# Patient Record
Sex: Male | Born: 1970 | Race: Black or African American | Hispanic: No | Marital: Single | Smoking: Never smoker
Health system: Southern US, Community
[De-identification: ages and names within clinical notes are randomized; demographics above are authoritative.]

## PROBLEM LIST (undated history)

## (undated) DIAGNOSIS — I10 Essential (primary) hypertension: Secondary | ICD-10-CM

## (undated) DIAGNOSIS — I509 Heart failure, unspecified: Secondary | ICD-10-CM

## (undated) DIAGNOSIS — E669 Obesity, unspecified: Secondary | ICD-10-CM

## (undated) DIAGNOSIS — E119 Type 2 diabetes mellitus without complications: Secondary | ICD-10-CM

## (undated) HISTORY — DX: Heart failure, unspecified: I50.9

## (undated) HISTORY — DX: Obesity, unspecified: E66.9

---

## 2019-04-18 HISTORY — PX: COLONOSCOPY: SHX174

## 2020-06-04 ENCOUNTER — Emergency Department (HOSPITAL_COMMUNITY): Payer: Self-pay

## 2020-06-04 ENCOUNTER — Emergency Department (HOSPITAL_COMMUNITY)
Admission: EM | Admit: 2020-06-04 | Discharge: 2020-06-04 | Disposition: A | Discharge status: Home / Self Care | Payer: Self-pay | Attending: Emergency Medicine | Admitting: Emergency Medicine

## 2020-06-04 ENCOUNTER — Encounter (HOSPITAL_COMMUNITY): Payer: Self-pay | Admitting: Emergency Medicine

## 2020-06-04 ENCOUNTER — Other Ambulatory Visit: Payer: Self-pay

## 2020-06-04 DIAGNOSIS — E1165 Type 2 diabetes mellitus with hyperglycemia: Secondary | ICD-10-CM | POA: Insufficient documentation

## 2020-06-04 DIAGNOSIS — R0602 Shortness of breath: Secondary | ICD-10-CM

## 2020-06-04 DIAGNOSIS — Z79899 Other long term (current) drug therapy: Secondary | ICD-10-CM | POA: Insufficient documentation

## 2020-06-04 DIAGNOSIS — M7989 Other specified soft tissue disorders: Secondary | ICD-10-CM

## 2020-06-04 DIAGNOSIS — I509 Heart failure, unspecified: Secondary | ICD-10-CM | POA: Insufficient documentation

## 2020-06-04 DIAGNOSIS — R21 Rash and other nonspecific skin eruption: Secondary | ICD-10-CM | POA: Insufficient documentation

## 2020-06-04 DIAGNOSIS — Z20822 Contact with and (suspected) exposure to covid-19: Secondary | ICD-10-CM | POA: Insufficient documentation

## 2020-06-04 DIAGNOSIS — R059 Cough, unspecified: Secondary | ICD-10-CM | POA: Insufficient documentation

## 2020-06-04 DIAGNOSIS — R600 Localized edema: Secondary | ICD-10-CM | POA: Insufficient documentation

## 2020-06-04 DIAGNOSIS — R000 Tachycardia, unspecified: Secondary | ICD-10-CM | POA: Insufficient documentation

## 2020-06-04 DIAGNOSIS — R079 Chest pain, unspecified: Secondary | ICD-10-CM | POA: Insufficient documentation

## 2020-06-04 DIAGNOSIS — Z7982 Long term (current) use of aspirin: Secondary | ICD-10-CM | POA: Insufficient documentation

## 2020-06-04 DIAGNOSIS — I110 Hypertensive heart disease with heart failure: Secondary | ICD-10-CM | POA: Insufficient documentation

## 2020-06-04 DIAGNOSIS — R739 Hyperglycemia, unspecified: Secondary | ICD-10-CM

## 2020-06-04 HISTORY — DX: Type 2 diabetes mellitus without complications: E11.9

## 2020-06-04 HISTORY — DX: Essential (primary) hypertension: I10

## 2020-06-04 HISTORY — DX: Heart failure, unspecified: I50.9

## 2020-06-04 LAB — BASIC METABOLIC PANEL
Anion gap: 15 (ref 5–15)
BUN: 9 mg/dL (ref 6–20)
CO2: 21 mmol/L — ABNORMAL LOW (ref 22–32)
Calcium: 9 mg/dL (ref 8.9–10.3)
Chloride: 96 mmol/L — ABNORMAL LOW (ref 98–111)
Creatinine, Ser: 1.13 mg/dL (ref 0.61–1.24)
GFR, Estimated: >60 mL/min (ref >60)
Glucose, Bld: 537 mg/dL (ref 70–99)
Potassium: 3.5 mmol/L (ref 3.5–5.1)
Sodium: 132 mmol/L — ABNORMAL LOW (ref 135–145)

## 2020-06-04 LAB — TROPONIN I (HIGH SENSITIVITY)
Troponin I (High Sensitivity): 30 ng/L — ABNORMAL HIGH (ref <18)
Troponin I (High Sensitivity): 30 ng/L — ABNORMAL HIGH (ref <18)

## 2020-06-04 LAB — RESP PANEL BY RT-PCR (FLU A&B, COVID) ARPGX2
Influenza A by PCR: NEGATIVE
Influenza B by PCR: NEGATIVE
SARS Coronavirus 2 by RT PCR: NEGATIVE

## 2020-06-04 LAB — HEPATIC FUNCTION PANEL
ALT: 29 U/L (ref 0–44)
AST: 35 U/L (ref 15–41)
Albumin: 3.5 g/dL (ref 3.5–5.0)
Alkaline Phosphatase: 86 U/L (ref 38–126)
Bilirubin, Direct: 0.3 mg/dL — ABNORMAL HIGH (ref 0.0–0.2)
Indirect Bilirubin: 0.8 mg/dL (ref 0.3–0.9)
Total Bilirubin: 1.1 mg/dL (ref 0.3–1.2)
Total Protein: 7.8 g/dL (ref 6.5–8.1)

## 2020-06-04 LAB — CBC
HCT: 47.6 % (ref 39.0–52.0)
Hemoglobin: 16.2 g/dL (ref 13.0–17.0)
MCH: 31 pg (ref 26.0–34.0)
MCHC: 34 g/dL (ref 30.0–36.0)
MCV: 91 fL (ref 80.0–100.0)
Platelets: 271 10*3/uL (ref 150–400)
RBC: 5.23 MIL/uL (ref 4.22–5.81)
RDW: 13.3 % (ref 11.5–15.5)
WBC: 8.7 10*3/uL (ref 4.0–10.5)
nRBC: 0 % (ref 0.0–0.2)

## 2020-06-04 LAB — CBG MONITORING, ED: Glucose-Capillary: 369 mg/dL — ABNORMAL HIGH (ref 70–99)

## 2020-06-04 LAB — BRAIN NATRIURETIC PEPTIDE: B Natriuretic Peptide: 499.7 pg/mL — ABNORMAL HIGH (ref 0.0–100.0)

## 2020-06-04 MED ORDER — METOPROLOL TARTRATE 5 MG/5ML IV SOLN
5.0000 mg | Freq: Once | INTRAVENOUS | Status: AC
  Administered 2020-06-04: 5 mg via INTRAVENOUS
  Filled 2020-06-04: qty 5

## 2020-06-04 MED ORDER — FUROSEMIDE 40 MG PO TABS: 40.0000 mg | ORAL_TABLET | Freq: Every day | ORAL | 0 refills | Status: DC

## 2020-06-04 MED ORDER — METFORMIN HCL 1000 MG PO TABS: 1000.0000 mg | ORAL_TABLET | Freq: Two times a day (BID) | ORAL | 0 refills | Status: DC

## 2020-06-04 MED ORDER — METOPROLOL TARTRATE 25 MG PO TABS: 12.5000 mg | ORAL_TABLET | Freq: Two times a day (BID) | ORAL | 0 refills | Status: DC

## 2020-06-04 MED ORDER — BASAGLAR KWIKPEN 100 UNIT/ML ~~LOC~~ SOPN: 30.0000 [IU] | PEN_INJECTOR | Freq: Every day | SUBCUTANEOUS | 0 refills | Status: DC

## 2020-06-04 MED ORDER — ATORVASTATIN CALCIUM 40 MG PO TABS: 40.0000 mg | ORAL_TABLET | Freq: Every day | ORAL | 0 refills | Status: DC

## 2020-06-04 MED ORDER — FUROSEMIDE 10 MG/ML IJ SOLN
40.0000 mg | Freq: Once | INTRAMUSCULAR | Status: AC
  Administered 2020-06-04: 40 mg via INTRAVENOUS
  Filled 2020-06-04: qty 4

## 2020-06-04 MED ORDER — INSULIN ASPART 100 UNIT/ML ~~LOC~~ SOLN
5.0000 [IU] | Freq: Once | SUBCUTANEOUS | Status: AC
  Administered 2020-06-04: 5 [IU] via SUBCUTANEOUS

## 2020-06-04 NOTE — ED Notes (Signed)
Patient Alert and oriented to baseline. Stable and ambulatory to baseline. Patient verbalized understanding of the discharge instructions.  Patient belongings were taken by the patient.   

## 2020-06-04 NOTE — ED Notes (Signed)
Pt ambulated without difficulty, gait steady and even. Oxygen saturation remained 98% and above.

## 2020-06-04 NOTE — ED Provider Notes (Signed)
[name] Provider Note   CSN: [id number] Arrival date & time: 06/04/20  1256     History Chief Complaint  Patient presents with  . Congestive Heart Failure    Cleotilde NeerJames Young is a 50 y.o. male w/ h/o CHF (LVEF 15-20%), non-ischemic cardiomyopathy, T2DM, and HLD who presents to the ED for leg swelling and SOB. Patient recently moved to the area from [place] and ran out of all of his medications approximately 1 month ago and has not yet established himself with a new provider. Endorses leg swelling, abdominal distention, DOE, orthopnea, and cough when lying flat. Patient also has not taken his metformin for the last month. Denies fever, chills, abdominal, N/V/D. Brief moment of sharp chest pain with walking several days ago that lasted several minutes and resolved spontaneously. No episodes of chest pain since then.  The history is provided by the patient and medical records.  Congestive Heart Failure This is a new problem. The current episode started more than 1 week ago. The problem occurs constantly. The problem has been gradually worsening. Associated symptoms include chest pain and shortness of breath. Pertinent negatives include no abdominal pain and no headaches. The symptoms are aggravated by walking (lying flat). The symptoms are relieved by rest (sitting up). He has tried nothing for the symptoms.  Shortness of Breath Severity:  Moderate Onset quality:  Gradual Duration:  1 month Timing:  Intermittent Progression:  Worsening Chronicity:  New Context: activity   Context comment:  Out of CHF medications Relieved by:  Rest and sitting up Worsened by:  Exertion (lying flat) Ineffective treatments:  None tried Associated symptoms: chest pain, cough and rash   Associated symptoms: no abdominal pain, no diaphoresis, no ear pain, no fever, no headaches, no sore throat, no sputum production, no vomiting and no wheezing   Risk factors: obesity         Past Medical History:  Diagnosis Date  . CHF (congestive heart failure) (HCC)   . Diabetes mellitus without complication (HCC)   . Hypertension     There are no problems to display for this patient.   History reviewed. No pertinent surgical history.     No family history on file.     Home Medications Prior to Admission medications   Medication Sig Start Date End Date Taking? Authorizing Provider  aspirin EC 81 MG tablet Take 81 mg by mouth daily. Swallow whole.   Yes [provider]  ibuprofen (ADVIL) 200 MG tablet Take 400 mg by mouth every 6 (six) hours as needed for headache.   Yes [provider]  Multiple Vitamins-Minerals (MULTIVITAMIN WITH MINERALS) tablet Take 1 tablet by mouth 3 (three) times a week.   Yes [provider]  atorvastatin (LIPITOR) 40 MG tablet Take 1 tablet (40 mg total) by mouth daily. 06/04/20   [name], Quantavius Humm, MD  furosemide (LASIX) 40 MG tablet Take 1 tablet (40 mg total) by mouth daily. 06/04/20   [name], [name], MD  Insulin Glargine [facility](BASAGLAR KWIKPEN) 100 UNIT/ML Inject 30 Units into the skin at bedtime for 14 days. 06/04/20 06/18/20  [name], [name], MD  metFORMIN (GLUCOPHAGE) 1000 MG tablet Take 1 tablet (1,000 mg total) by mouth 2 (two) times daily with a meal for 14 days. 06/04/20 06/18/20  [name], [name], MD  metoprolol tartrate (LOPRESSOR) 25 MG tablet Take 0.5 tablets (12.5 mg total) by mouth 2 (two) times daily for 14 days. 06/04/20 06/18/20  [name], [name], MD    Allergies  Lisinopril  Review of Systems   Review of Systems  Constitutional: Negative for chills, diaphoresis and fever.  HENT: Negative for ear pain and sore throat.   Eyes: Negative for pain and visual disturbance.  Respiratory: Positive for cough and shortness of breath. Negative for sputum production and wheezing.   Cardiovascular: Positive for chest pain and leg swelling. Negative for palpitations.  Gastrointestinal: Positive for abdominal distention.  Negative for abdominal pain and vomiting.  Genitourinary: Negative for dysuria and hematuria.  Musculoskeletal: Negative for arthralgias and back pain.  Skin: Positive for rash. Negative for color change.  Neurological: Negative for seizures, syncope and headaches.  All other systems reviewed and are negative.   Physical Exam Updated Vital Signs BP (!) 157/116   Pulse 95   Temp 98.7 F (37.1 C)   Resp 20   SpO2 100%   Physical Exam Vitals and nursing note reviewed.  Constitutional:      General: He is awake. He is not in acute distress.    Appearance: He is well-developed and well-nourished. He is morbidly obese. He is not ill-appearing.  HENT:     Head: Normocephalic and atraumatic.     Right Ear: External ear normal.     Left Ear: External ear normal.     Nose: Nose normal.     Mouth/Throat:     Mouth: Mucous membranes are moist.     Pharynx: Oropharynx is clear. No oropharyngeal exudate or posterior oropharyngeal erythema.  Eyes:     General: No scleral icterus.       Right eye: No discharge.        Left eye: No discharge.     Conjunctiva/sclera: Conjunctivae normal.  Neck:     Vascular: No JVD.  Cardiovascular:     Rate and Rhythm: Regular rhythm. Tachycardia present.     Pulses: Normal pulses.          Radial pulses are 2+ on the right side and 2+ on the left side.     Heart sounds: Normal heart sounds. No murmur heard.   Pulmonary:     Effort: Pulmonary effort is normal. No respiratory distress.     Breath sounds: Normal breath sounds. No wheezing, rhonchi or rales.  Abdominal:     General: Abdomen is flat. There is no distension.     Palpations: Abdomen is soft.     Tenderness: There is no abdominal tenderness. There is no guarding or rebound.  Musculoskeletal:     Cervical back: Neck supple.     Right lower leg: 1+ Pitting Edema present.     Left lower leg: 1+ Pitting Edema present.  Skin:    General: Skin is warm and dry.  Neurological:     General:  No focal deficit present.     Mental Status: He is alert and oriented to person, place, and time.     Sensory: No sensory deficit.     Motor: No weakness.  Psychiatric:        Mood and Affect: Mood and affect and mood normal.        Behavior: Behavior normal. Behavior is cooperative.     ED Results / Procedures / Treatments   Labs (all labs ordered are listed, but only abnormal results are displayed) Labs Reviewed  BASIC METABOLIC PANEL - Abnormal; Notable for the following components:      Result Value   Sodium 132 (*)    Chloride 96 (*)    CO2 21 (*)  Glucose, Bld 537 (*)    All other components within normal limits  HEPATIC FUNCTION PANEL - Abnormal; Notable for the following components:   Bilirubin, Direct 0.3 (*)    All other components within normal limits  BRAIN NATRIURETIC PEPTIDE - Abnormal; Notable for the following components:   B Natriuretic Peptide 499.7 (*)    All other components within normal limits  CBG MONITORING, ED - Abnormal; Notable for the following components:   Glucose-Capillary 369 (*)    All other components within normal limits  TROPONIN I (HIGH SENSITIVITY) - Abnormal; Notable for the following components:   Troponin I (High Sensitivity) 30 (*)    All other components within normal limits  TROPONIN I (HIGH SENSITIVITY) - Abnormal; Notable for the following components:   Troponin I (High Sensitivity) 30 (*)    All other components within normal limits  RESP PANEL BY RT-PCR (FLU A&B, COVID) [id number]  CBC    EKG EKG Interpretation  Date/Time:  Friday June 04 2020 13:01:09 EST Ventricular Rate:  117 PR Interval:  146 QRS Duration: 134 QT Interval:  344 QTC Calculation: 479 R Axis:   -70 Text Interpretation: Sinus tachycardia Left axis deviation Non-specific intra-ventricular conduction block Minimal voltage criteria for LVH, may be normal variant ( Cornell product ) Cannot rule out Septal infarct , age undetermined Possible Lateral  infarct , age undetermined Abnormal ECG No old tracing to compare Confirmed by Pricilla Loveless [phone]) on 06/04/2020 3:14:23 PM   Radiology DG Chest 2 View  Result Date: 06/04/2020 CLINICAL DATA:  Chest pain EXAM: CHEST - 2 VIEW COMPARISON:  August 08, 2016 FINDINGS: The cardiomediastinal silhouette is enlarged and unchanged in contour. No pleural effusion. No pneumothorax. Mild perihilar vascular fullness, peribronchial cuffing and mild interstitial prominence. Visualized abdomen is unremarkable. No acute osseous abnormality noted. IMPRESSION: Constellation of findings are favored to reflect mild pulmonary edema. Electronically Signed   By: Meda Klinefelter MD   On: 06/04/2020 13:22    Procedures Procedures  Medications Ordered in ED Medications  furosemide (LASIX) injection 40 mg (40 mg Intravenous Given 06/04/20 1545)  insulin aspart (novoLOG) injection 5 Units (5 Units Subcutaneous Given 06/04/20 1545)  metoprolol tartrate (LOPRESSOR) injection 5 mg (5 mg Intravenous Given 06/04/20 1546)    ED Course  I have reviewed the triage vital signs and the nursing notes.  Pertinent labs & imaging results that were available during my care of the patient were reviewed by me and considered in my medical decision making (see chart for details).    MDM Rules/Calculators/A&P                          Patient is a 49yoM with history and physical as described above who presents to the ED for leg swelling, orthopnea, and DOE. VS notable for tachycardia to 100s, hypertensive with SBP 160s, satting well on RA, and otherwise HDS. Patient resting comfortably in no acute distress. Initial workup includes CBC, BMP, LFTs, BNP, trop, ECG, and CXR. Initial treatment includes Lasix, metoprolol, and insulin.  On reassessment, patient ambulating in ED and maintaining O2 sats 98% without significant tachycardia or tachypnea. Patient states he feels well and comfortable returning home. Repeat glucose improved  following insulin. Labs not concerning for DKA or HHS. BNP elevated concerning for mild CHF exacerbation in setting of medication non-adherence. Troponin mildly elevated but delta troponin stable. ECG also reassuring for ACS and no reported chest pain. Doubt infectious etiology at  this time given patient afebrile and no leukocytosis. Given reassuring presentation and ambulating without hypoxia or increased WOB, patient appropriate for discharge. Shared decision making with patient. Will refill patient's medications. Patient states his mother works at [employer] and will help him find new PCP. ED pharmacist performed med rec and prescriptions re-ordered. Patient otherwise HDS and appropriate for discharge.  Strict return precautions provided and discussed. Questions and concerns addressed. Patient verbalized understanding and amenable with discharge plan. Patient discharged in stable condition.  Final Clinical Impression(s) / ED Diagnoses Final diagnoses:  Leg swelling  SOB (shortness of breath)  Congestive heart failure, unspecified HF chronicity, unspecified heart failure type (HCC)  Hyperglycemia    Rx / DC Orders ED Discharge Orders         Ordered    atorvastatin (LIPITOR) 40 MG tablet  Daily        06/04/20 1804    furosemide (LASIX) 40 MG tablet  Daily        06/04/20 1804    Insulin Glargine (BASAGLAR KWIKPEN) 100 UNIT/ML  Daily at bedtime        06/04/20 1804    metFORMIN (GLUCOPHAGE) 1000 MG tablet  2 times daily with meals        06/04/20 1804    metoprolol tartrate (LOPRESSOR) 25 MG tablet  2 times daily        06/04/20 1804           Tonia Brooms, MD 06/05/20 Estrella Myrtle    Pricilla Loveless, MD 06/05/20 2121

## 2020-06-04 NOTE — ED Triage Notes (Addendum)
Pt reports hx of CHF and been out of medications for over 1  Month, he is here today due to bilateral leg swelling and shortness of breath. He has also had intermittent chest pains over the last week none currently.   He also adds he has HTN & T2DM and has not had these meds for 1 month.

## 2020-06-04 NOTE — ED Notes (Signed)
D&C IV 

## 2021-01-26 ENCOUNTER — Encounter (HOSPITAL_COMMUNITY): Payer: Self-pay | Admitting: Emergency Medicine

## 2021-01-26 ENCOUNTER — Inpatient Hospital Stay (HOSPITAL_COMMUNITY)
Admission: EM | Admit: 2021-01-26 | Discharge: 2021-01-31 | DRG: 291 | Disposition: A | Discharge status: Home / Self Care | Payer: Self-pay | Attending: Internal Medicine | Admitting: Internal Medicine

## 2021-01-26 ENCOUNTER — Emergency Department (HOSPITAL_COMMUNITY): Payer: Self-pay

## 2021-01-26 ENCOUNTER — Other Ambulatory Visit: Payer: Self-pay

## 2021-01-26 DIAGNOSIS — T502X5A Adverse effect of carbonic-anhydrase inhibitors, benzothiadiazides and other diuretics, initial encounter: Secondary | ICD-10-CM | POA: Diagnosis present

## 2021-01-26 DIAGNOSIS — E785 Hyperlipidemia, unspecified: Secondary | ICD-10-CM | POA: Diagnosis present

## 2021-01-26 DIAGNOSIS — Z794 Long term (current) use of insulin: Secondary | ICD-10-CM

## 2021-01-26 DIAGNOSIS — Z7982 Long term (current) use of aspirin: Secondary | ICD-10-CM

## 2021-01-26 DIAGNOSIS — I160 Hypertensive urgency: Secondary | ICD-10-CM | POA: Diagnosis present

## 2021-01-26 DIAGNOSIS — Z597 Insufficient social insurance and welfare support: Secondary | ICD-10-CM

## 2021-01-26 DIAGNOSIS — E1165 Type 2 diabetes mellitus with hyperglycemia: Secondary | ICD-10-CM | POA: Diagnosis present

## 2021-01-26 DIAGNOSIS — J810 Acute pulmonary edema: Secondary | ICD-10-CM

## 2021-01-26 DIAGNOSIS — I10 Essential (primary) hypertension: Secondary | ICD-10-CM | POA: Diagnosis present

## 2021-01-26 DIAGNOSIS — E1100 Type 2 diabetes mellitus with hyperosmolarity without nonketotic hyperglycemic-hyperosmolar coma (NKHHC): Secondary | ICD-10-CM

## 2021-01-26 DIAGNOSIS — Z79899 Other long term (current) drug therapy: Secondary | ICD-10-CM

## 2021-01-26 DIAGNOSIS — Z6841 Body Mass Index (BMI) 40.0 and over, adult: Secondary | ICD-10-CM

## 2021-01-26 DIAGNOSIS — Z888 Allergy status to other drugs, medicaments and biological substances status: Secondary | ICD-10-CM

## 2021-01-26 DIAGNOSIS — E119 Type 2 diabetes mellitus without complications: Secondary | ICD-10-CM

## 2021-01-26 DIAGNOSIS — E1140 Type 2 diabetes mellitus with diabetic neuropathy, unspecified: Secondary | ICD-10-CM | POA: Diagnosis present

## 2021-01-26 DIAGNOSIS — I248 Other forms of acute ischemic heart disease: Secondary | ICD-10-CM | POA: Diagnosis present

## 2021-01-26 DIAGNOSIS — R0602 Shortness of breath: Secondary | ICD-10-CM

## 2021-01-26 DIAGNOSIS — I420 Dilated cardiomyopathy: Secondary | ICD-10-CM | POA: Diagnosis present

## 2021-01-26 DIAGNOSIS — I110 Hypertensive heart disease with heart failure: Principal | ICD-10-CM | POA: Diagnosis present

## 2021-01-26 DIAGNOSIS — E66812 Obesity, class 2: Secondary | ICD-10-CM

## 2021-01-26 DIAGNOSIS — I454 Nonspecific intraventricular block: Secondary | ICD-10-CM | POA: Diagnosis present

## 2021-01-26 DIAGNOSIS — Z91119 Patient's noncompliance with dietary regimen due to unspecified reason: Secondary | ICD-10-CM

## 2021-01-26 DIAGNOSIS — I2489 Other forms of acute ischemic heart disease: Secondary | ICD-10-CM

## 2021-01-26 DIAGNOSIS — I5023 Acute on chronic systolic (congestive) heart failure: Secondary | ICD-10-CM

## 2021-01-26 DIAGNOSIS — E876 Hypokalemia: Secondary | ICD-10-CM

## 2021-01-26 DIAGNOSIS — Z9114 Patient's other noncompliance with medication regimen: Secondary | ICD-10-CM

## 2021-01-26 DIAGNOSIS — E66813 Obesity, class 3: Secondary | ICD-10-CM

## 2021-01-26 DIAGNOSIS — Z20822 Contact with and (suspected) exposure to covid-19: Secondary | ICD-10-CM | POA: Diagnosis present

## 2021-01-26 DIAGNOSIS — I5043 Acute on chronic combined systolic (congestive) and diastolic (congestive) heart failure: Secondary | ICD-10-CM | POA: Diagnosis present

## 2021-01-26 DIAGNOSIS — Z7984 Long term (current) use of oral hypoglycemic drugs: Secondary | ICD-10-CM

## 2021-01-26 LAB — CBC WITH DIFFERENTIAL/PLATELET
Abs Immature Granulocytes: 0.02 10*3/uL (ref 0.00–0.07)
Basophils Absolute: 0.1 10*3/uL (ref 0.0–0.1)
Basophils Relative: 1 %
Eosinophils Absolute: 0.1 10*3/uL (ref 0.0–0.5)
Eosinophils Relative: 2 %
HCT: 47.5 % (ref 39.0–52.0)
Hemoglobin: 15.8 g/dL (ref 13.0–17.0)
Immature Granulocytes: 0 %
Lymphocytes Relative: 21 %
Lymphs Abs: 1.5 10*3/uL (ref 0.7–4.0)
MCH: 30.2 pg (ref 26.0–34.0)
MCHC: 33.3 g/dL (ref 30.0–36.0)
MCV: 90.6 fL (ref 80.0–100.0)
Monocytes Absolute: 0.6 10*3/uL (ref 0.1–1.0)
Monocytes Relative: 9 %
Neutro Abs: 4.9 10*3/uL (ref 1.7–7.7)
Neutrophils Relative %: 67 %
Platelets: 218 10*3/uL (ref 150–400)
RBC: 5.24 MIL/uL (ref 4.22–5.81)
RDW: 13.1 % (ref 11.5–15.5)
WBC: 7.3 10*3/uL (ref 4.0–10.5)
nRBC: 0 % (ref 0.0–0.2)

## 2021-01-26 LAB — GLUCOSE, CAPILLARY: Glucose-Capillary: 281 mg/dL — ABNORMAL HIGH (ref 70–99)

## 2021-01-26 LAB — COMPREHENSIVE METABOLIC PANEL
ALT: 38 U/L (ref 0–44)
AST: 32 U/L (ref 15–41)
Albumin: 3.1 g/dL — ABNORMAL LOW (ref 3.5–5.0)
Alkaline Phosphatase: 89 U/L (ref 38–126)
Anion gap: 12 (ref 5–15)
BUN: 11 mg/dL (ref 6–20)
CO2: 26 mmol/L (ref 22–32)
Calcium: 9 mg/dL (ref 8.9–10.3)
Chloride: 97 mmol/L — ABNORMAL LOW (ref 98–111)
Creatinine, Ser: 1.22 mg/dL (ref 0.61–1.24)
GFR, Estimated: >60 mL/min (ref >60)
Glucose, Bld: 315 mg/dL — ABNORMAL HIGH (ref 70–99)
Potassium: 3.2 mmol/L — ABNORMAL LOW (ref 3.5–5.1)
Sodium: 135 mmol/L (ref 135–145)
Total Bilirubin: 1.7 mg/dL — ABNORMAL HIGH (ref 0.3–1.2)
Total Protein: 6.8 g/dL (ref 6.5–8.1)

## 2021-01-26 LAB — BRAIN NATRIURETIC PEPTIDE: B Natriuretic Peptide: 740.6 pg/mL — ABNORMAL HIGH (ref 0.0–100.0)

## 2021-01-26 LAB — RESP PANEL BY RT-PCR (FLU A&B, COVID) ARPGX2
Influenza A by PCR: NEGATIVE
Influenza B by PCR: NEGATIVE
SARS Coronavirus 2 by RT PCR: NEGATIVE

## 2021-01-26 LAB — TROPONIN I (HIGH SENSITIVITY)
Troponin I (High Sensitivity): 39 ng/L — ABNORMAL HIGH (ref <18)
Troponin I (High Sensitivity): 43 ng/L — ABNORMAL HIGH (ref <18)

## 2021-01-26 MED ORDER — ATORVASTATIN CALCIUM 40 MG PO TABS
40.0000 mg | ORAL_TABLET | Freq: Every day | ORAL | Status: DC
  Administered 2021-01-27 – 2021-01-31 (×5): 40 mg via ORAL
  Filled 2021-01-26 (×5): qty 1

## 2021-01-26 MED ORDER — POTASSIUM CHLORIDE CRYS ER 20 MEQ PO TBCR
40.0000 meq | EXTENDED_RELEASE_TABLET | Freq: Once | ORAL | Status: AC
  Administered 2021-01-26: 40 meq via ORAL
  Filled 2021-01-26: qty 2

## 2021-01-26 MED ORDER — ASPIRIN EC 81 MG PO TBEC
81.0000 mg | DELAYED_RELEASE_TABLET | Freq: Every day | ORAL | Status: DC
  Administered 2021-01-27 – 2021-01-31 (×5): 81 mg via ORAL
  Filled 2021-01-26 (×5): qty 1

## 2021-01-26 MED ORDER — ENOXAPARIN SODIUM 80 MG/0.8ML IJ SOSY
75.0000 mg | PREFILLED_SYRINGE | Freq: Every day | INTRAMUSCULAR | Status: DC
  Administered 2021-01-26 – 2021-01-30 (×5): 75 mg via SUBCUTANEOUS
  Filled 2021-01-26 (×3): qty 0.8
  Filled 2021-01-26: qty 0.75
  Filled 2021-01-26: qty 0.8

## 2021-01-26 MED ORDER — LABETALOL HCL 5 MG/ML IV SOLN: 10.0000 mg | INTRAVENOUS | Status: DC | PRN

## 2021-01-26 MED ORDER — ACETAMINOPHEN 650 MG RE SUPP: 650.0000 mg | Freq: Four times a day (QID) | RECTAL | Status: DC | PRN

## 2021-01-26 MED ORDER — INSULIN ASPART 100 UNIT/ML IJ SOLN
0.0000 [IU] | Freq: Three times a day (TID) | INTRAMUSCULAR | Status: DC
  Administered 2021-01-27: 2 [IU] via SUBCUTANEOUS
  Administered 2021-01-27: 8 [IU] via SUBCUTANEOUS
  Administered 2021-01-27 – 2021-01-28 (×2): 2 [IU] via SUBCUTANEOUS
  Administered 2021-01-28 – 2021-01-29 (×3): 5 [IU] via SUBCUTANEOUS
  Administered 2021-01-30: 2 [IU] via SUBCUTANEOUS
  Administered 2021-01-30 – 2021-01-31 (×3): 3 [IU] via SUBCUTANEOUS

## 2021-01-26 MED ORDER — FUROSEMIDE 10 MG/ML IJ SOLN
40.0000 mg | Freq: Every day | INTRAMUSCULAR | Status: DC
  Administered 2021-01-27: 40 mg via INTRAVENOUS
  Filled 2021-01-26: qty 4

## 2021-01-26 MED ORDER — ACETAMINOPHEN 325 MG PO TABS: 650.0000 mg | ORAL_TABLET | Freq: Four times a day (QID) | ORAL | Status: DC | PRN

## 2021-01-26 MED ORDER — METOPROLOL TARTRATE 25 MG PO TABS
25.0000 mg | ORAL_TABLET | Freq: Two times a day (BID) | ORAL | Status: DC
  Administered 2021-01-26 – 2021-01-27 (×2): 25 mg via ORAL
  Filled 2021-01-26 (×2): qty 1

## 2021-01-26 MED ORDER — INSULIN GLARGINE-YFGN 100 UNIT/ML ~~LOC~~ SOLN
20.0000 [IU] | Freq: Every day | SUBCUTANEOUS | Status: DC
  Administered 2021-01-26 – 2021-01-30 (×5): 20 [IU] via SUBCUTANEOUS
  Filled 2021-01-26 (×6): qty 0.2

## 2021-01-26 MED ORDER — FUROSEMIDE 10 MG/ML IJ SOLN
40.0000 mg | Freq: Once | INTRAMUSCULAR | Status: AC
  Administered 2021-01-26: 40 mg via INTRAVENOUS
  Filled 2021-01-26: qty 4

## 2021-01-26 MED ORDER — ONDANSETRON HCL 4 MG PO TABS: 4.0000 mg | ORAL_TABLET | Freq: Four times a day (QID) | ORAL | Status: DC | PRN

## 2021-01-26 MED ORDER — NITROGLYCERIN 2 % TD OINT
1.0000 [in_us] | TOPICAL_OINTMENT | Freq: Four times a day (QID) | TRANSDERMAL | Status: DC
  Administered 2021-01-26 – 2021-01-27 (×3): 1 [in_us] via TOPICAL
  Filled 2021-01-26: qty 30
  Filled 2021-01-26: qty 1

## 2021-01-26 MED ORDER — ONDANSETRON HCL 4 MG/2ML IJ SOLN
4.0000 mg | Freq: Four times a day (QID) | INTRAMUSCULAR | Status: DC | PRN
  Administered 2021-01-30: 4 mg via INTRAVENOUS
  Filled 2021-01-26: qty 2

## 2021-01-26 NOTE — ED Triage Notes (Signed)
Pt reports leg and abd swelling for about a week and half. Also having CP and SOB. States he relocated and missed medications.

## 2021-01-26 NOTE — ED Provider Notes (Signed)
Emergency Medicine Provider Triage Evaluation Note  Terry Young , a 50 y.o. male  was evaluated in triage.  Pt complains of chest pain and shortness of breath.  Been intermittent for the last week and a half.  Patient has been without his home medicine for the last month, reports bilateral leg swelling all the way up to his abdomen..  Review of Systems  Positive: Leg and abdominal swelling, chest pain, shortness of breath Negative: Fever  Physical Exam  BP (!) 151/132   Pulse (!) 106   Temp 98.8 F (37.1 C)   Resp 18   Ht 5\' 10"  (1.778 m)   Wt (!) 154.2 kg   SpO2 100%   BMI 48.78 kg/m  Gen:   Awake, no distress   Resp:  Normal effort  MSK:   Moves extremities without difficulty  Other:  Anasarca  Medical Decision Making  Medically screening exam initiated at 4:51 PM.  Appropriate orders placed.  Tyshon Fanning was informed that the remainder of the evaluation will be completed by another provider, this initial triage assessment does not replace that evaluation, and the importance of remaining in the ED until their evaluation is complete.  Labs, imaging for chest pain and fluid retention.  Patient has heart failure, concern for heart failure exacerbation.   Cleotilde Neer, PA-C 01/26/21 1651    03/28/21, MD 01/27/21 [phone]

## 2021-01-26 NOTE — H&P (Signed)
History and Physical    Terry Young DQQ:[id number] DOB: March 21, 1971 DOA: 01/26/2021  PCP: Pcp, No  Patient coming from: Home  I have personally briefly reviewed patient's old medical records in [facility]  Chief Complaint: CP,SOB  HPI: Terry Young is a 50 y.o. male with medical history significant of HFrEF from [facility], DM2, HTN.  Non-adherent to medical therapy.  H/o EF 15-20% previously.  Looks like 25-30% as of 2d echo in 2020.  Nothing more recent that I can see.  Pt ran out of meds a month ago.  Presents to ED with c/o SOB, BLE edema all the way to abdomen, progressively worsening over past month.  Severe SOB with minimal exertion.  No CP.   ED Course: BNP elevation, pulm vasc congestion on CXR.  Given 40mg  IV lasix.  Noted to be very hypertensive with BP 190/160   Review of Systems: As per HPI, otherwise all review of systems negative.  Past Medical History:  Diagnosis Date   CHF (congestive heart failure) (HCC)    Diabetes mellitus without complication (HCC)    Hypertension     History reviewed. No pertinent surgical history.   reports that he has never smoked. He has never used smokeless tobacco. He reports that he does not drink alcohol and does not use drugs.  Allergies  Allergen Reactions   Lisinopril Cough    Family History  Problem Relation Age of Onset   Atrial fibrillation Neg Hx      Prior to Admission medications   Medication Sig Start Date End Date Taking? Authorizing Provider  aspirin EC 81 MG tablet Take 81 mg by mouth daily. Swallow whole.   Yes [provider]  atorvastatin (LIPITOR) 40 MG tablet Take 1 tablet (40 mg total) by mouth daily. 06/04/20  Yes 06/06/20, MD  furosemide (LASIX) 40 MG tablet Take 1 tablet (40 mg total) by mouth daily. 06/04/20  Yes 06/06/20, MD  ibuprofen (ADVIL) 200 MG tablet Take 400 mg by mouth every 6 (six) hours as needed for headache or moderate pain.   Yes [provider]  metFORMIN  (GLUCOPHAGE) 1000 MG tablet Take 1,000 mg by mouth 2 (two) times daily with a meal.   Yes [provider]  metoprolol tartrate (LOPRESSOR) 25 MG tablet Take 25 mg by mouth 2 (two) times daily.   Yes [provider]  Multiple Vitamins-Minerals (CENTRUM MEN) TABS Take 1 tablet by mouth daily.   Yes [provider]  Insulin Glargine (BASAGLAR KWIKPEN) 100 UNIT/ML Inject 30 Units into the skin at bedtime for 14 days. Patient not taking: Reported on 01/26/2021 06/04/20 06/18/20  08/18/20, MD  metoprolol tartrate (LOPRESSOR) 25 MG tablet Take 0.5 tablets (12.5 mg total) by mouth 2 (two) times daily for 14 days. Patient not taking: Reported on 01/26/2021 06/04/20 06/18/20  08/18/20, MD    Physical Exam: Vitals:   01/26/21 2002 01/26/21 2030 01/26/21 2100 01/26/21 2130  BP: (!) 172/122 (!) 175/140 (!) 190/161 (!) 192/130  Pulse: (!) 101 96 95 (!) 102  Resp: 20 14 (!) 22 15  Temp:      SpO2: 100% 100% 100% 100%  Weight:      Height:        Constitutional: NAD, calm, comfortable Eyes: PERRL, lids and conjunctivae normal ENMT: Mucous membranes are moist. Posterior pharynx clear of any exudate or lesions.Normal dentition.  Neck: normal, supple, no masses, no thyromegaly Respiratory: clear to auscultation bilaterally, no wheezing, no crackles.  Normal respiratory effort. No accessory muscle use.  Cardiovascular: Regular rate and rhythm, no murmurs / rubs / gallops. 3+ BLE edema. 2+ pedal pulses. No carotid bruits.  Abdomen: no tenderness, no masses palpated. No hepatosplenomegaly. Bowel sounds positive.  Musculoskeletal: no clubbing / cyanosis. No joint deformity upper and lower extremities. Good ROM, no contractures. Normal muscle tone.  Skin: no rashes, lesions, ulcers. No induration Neurologic: CN 2-12 grossly intact. Sensation intact, DTR normal. Strength 5/5 in all 4.  Psychiatric: Normal judgment and insight. Alert and oriented x 3. Normal mood.    Labs on  Admission: I have personally reviewed following labs and imaging studies  CBC: Recent Labs  Lab 01/26/21 1655  WBC 7.3  NEUTROABS 4.9  HGB 15.8  HCT 47.5  MCV 90.6  PLT 218   Basic Metabolic Panel: Recent Labs  Lab 01/26/21 1655  NA 135  K 3.2*  CL 97*  CO2 26  GLUCOSE 315*  BUN 11  CREATININE 1.22  CALCIUM 9.0   GFR: Estimated Creatinine Clearance: 111.7 mL/min (by C-G formula based on SCr of 1.22 mg/dL). Liver Function Tests: Recent Labs  Lab 01/26/21 1655  AST 32  ALT 38  ALKPHOS 89  BILITOT 1.7*  PROT 6.8  ALBUMIN 3.1*   No results for input(s): LIPASE, AMYLASE in the last 168 hours. No results for input(s): AMMONIA in the last 168 hours. Coagulation Profile: No results for input(s): INR, PROTIME in the last 168 hours. Cardiac Enzymes: No results for input(s): CKTOTAL, CKMB, CKMBINDEX, TROPONINI in the last 168 hours. BNP (last 3 results) No results for input(s): PROBNP in the last 8760 hours. HbA1C: No results for input(s): HGBA1C in the last 72 hours. CBG: No results for input(s): GLUCAP in the last 168 hours. Lipid Profile: No results for input(s): CHOL, HDL, LDLCALC, TRIG, CHOLHDL, LDLDIRECT in the last 72 hours. Thyroid Function Tests: No results for input(s): TSH, T4TOTAL, FREET4, T3FREE, THYROIDAB in the last 72 hours. Anemia Panel: No results for input(s): VITAMINB12, FOLATE, FERRITIN, TIBC, IRON, RETICCTPCT in the last 72 hours. Urine analysis: No results found for: COLORURINE, APPEARANCEUR, LABSPEC, PHURINE, GLUCOSEU, HGBUR, BILIRUBINUR, KETONESUR, PROTEINUR, UROBILINOGEN, NITRITE, LEUKOCYTESUR  Radiological Exams on Admission: DG Chest 2 View  Result Date: 01/26/2021 CLINICAL DATA:  Shortness of breath, leg and abdomen swelling for 1.5 weeks, chest pain, history of fluid on the lungs EXAM: CHEST - 2 VIEW COMPARISON:  06/04/2020 FINDINGS: Enlargement of cardiac silhouette with pulmonary vascular congestion. Mediastinal contours normal.  Minimal RIGHT basilar atelectasis. Lungs otherwise clear. No infiltrate, pleural effusion, or pneumothorax. IMPRESSION: Enlargement of cardiac silhouette with pulmonary vascular congestion. Minimal RIGHT basilar atelectasis. Electronically Signed   By: Ulyses Southward M.D.   On: 01/26/2021 18:27    EKG: Independently reviewed.  Assessment/Plan Principal Problem:   Acute on chronic systolic CHF (congestive heart failure) (HCC) Active Problems:   DM2 (diabetes mellitus, type 2) (HCC)   HTN (hypertension)    Acute on chronic systolic CHF / HTN urgency - Patient has acute decompensated CHF: Patient presents with: dyspnea on exertion / increased shortness of breath Exam findings include: bilateral leg edema Patient is being treated with IV Lasix CHF pathway PRN labetalol Restart metoprolol NTG paste Strict intake and output Daily BMP Tele monitor 2d echo DM2 - Lantus 20u QHS Mod scale SSI AC HTN - Restart metoprolol PRN labetalol NTG paste DVT prophylaxis: Lovenox Code Status: Full Family Communication: No family in room Disposition Plan: Home after CHF symptoms improved Consults called: None  Admission status: Place in obs     Ambreen Tufte M. DO [facility]  How to contact the [facility] Attending or Consulting provider 7A - 7P or covering provider during after hours 7P -7A, for this patient?  Check the care team in [facility] and look for a) attending/consulting [facility] provider listed and b) the [facility] team listed Log into [email or web address]  Amion Physician Scheduling and messaging for groups and whole hospitals  On call and physician scheduling software for group practices, residents, hospitalists and other medical providers for call, clinic, rotation and shift schedules. On[employer] is a hospital-wide system for scheduling doctors and paging doctors on call. EasyPlot is for scientific plotting and data analysis.  [email or web address]  and use [facility] universal password to access.  If you do not have the password, please contact the hospital operator.  Locate the [facility] provider you are looking for under [facility] and page to a number that you can be directly reached. If you still have difficulty reaching the provider, please page the [facility] (Director on Call) for the Hospitalists listed on amion for assistance.  01/26/2021, 10:14 PM

## 2021-01-26 NOTE — ED Notes (Signed)
Sister Victor Langenbach [phone] would like an update

## 2021-01-26 NOTE — ED Provider Notes (Signed)
Emergency Department Provider Note   I have reviewed the triage vital signs and the nursing notes.   HISTORY  Chief Complaint Shortness of Breath   HPI Terry Young is a 50 y.o. male with PMH of CHF (EF 15-20%) followed primarily in the [place] area presents to the ED with sudden edema to the legs and abdomen in the setting of medication noncompliance.  Patient states he is supposed to be taking the Lasix 40 mg twice daily but has essentially been out of all medications for at least the past month.  He tells me that he has rather quickly developed swelling in both legs up to the abdomen.  He is feeling subjectively short of breath with minimal exertion.  He denies chest pain or tightness.  No fevers or chills.  He does not currently follow with a primary care doctor or cardiologist.  He states that he is moving to the [place] area and seeking to establish care. No other modifying factors or radiation of symptoms.    Past Medical History:  Diagnosis Date   CHF (congestive heart failure) (HCC)    Diabetes mellitus without complication (HCC)    Hypertension     Patient Active Problem List   Diagnosis Date Noted   Hypokalemia 01/29/2021   Obesity, Class III, BMI 40-49.9 (morbid obesity) (HCC) 01/29/2021   Systolic CHF, acute on chronic (HCC) 01/27/2021   Acute on chronic combined systolic and diastolic CHF (congestive heart failure) (HCC) 01/26/2021   DM2 (diabetes mellitus, type 2) (HCC) 01/26/2021   HTN (hypertension) 01/26/2021    History reviewed. No pertinent surgical history.  Allergies Lisinopril  Family History  Problem Relation Age of Onset   Atrial fibrillation Neg Hx     Social History Social History   Tobacco Use   Smoking status: Never   Smokeless tobacco: Never  Substance Use Topics   Alcohol use: Never   Drug use: Never    Review of Systems  Constitutional: No fever/chills Eyes: No visual changes. ENT: No sore throat. Cardiovascular: Denies  chest pain. Positive bilateral leg and abdomen swelling.  Respiratory: Positive shortness of breath. Gastrointestinal: No abdominal pain.  No nausea, no vomiting.  No diarrhea.  No constipation. Genitourinary: Negative for dysuria. Musculoskeletal: Negative for back pain. Skin: Negative for rash. Neurological: Negative for headaches, focal weakness or numbness.  10-point ROS otherwise negative.  ____________________________________________   PHYSICAL EXAM:  VITAL SIGNS: ED Triage Vitals  Enc Vitals Group     BP 01/26/21 1645 (!) 151/132     Pulse Rate 01/26/21 1645 (!) 106     Resp 01/26/21 1645 18     Temp 01/26/21 1645 98.8 F (37.1 C)     Temp src --      SpO2 01/26/21 1645 100 %     Weight 01/26/21 1645 (!) 340 lb (154.2 kg)     Height 01/26/21 1645 5\' 10"  (1.778 m)   Constitutional: Alert and oriented. Well appearing and in no acute distress. Eyes: Conjunctivae are normal.  Head: Atraumatic. Nose: No congestion/rhinnorhea. Mouth/Throat: Mucous membranes are moist.   Neck: No stridor.   Cardiovascular: Normal rate, regular rhythm. Good peripheral circulation. Grossly normal heart sounds.   Respiratory: Normal respiratory effort.  No retractions. Lungs CTAB. Gastrointestinal: Soft and nontender. No distention.  Musculoskeletal: No lower extremity tenderness with 3+ pitting edema in the bilateral LEs extending to the abdomen. No gross deformities of extremities. Neurologic:  Normal speech and language. No gross focal neurologic deficits are  appreciated.  Skin:  Skin is warm, dry and intact. No rash noted.   ____________________________________________   LABS (all labs ordered are listed, but only abnormal results are displayed)  Labs Reviewed  COMPREHENSIVE METABOLIC PANEL - Abnormal; Notable for the following components:      Result Value   Potassium 3.2 (*)    Chloride 97 (*)    Glucose, Bld 315 (*)    Albumin 3.1 (*)    Total Bilirubin 1.7 (*)    All other  components within normal limits  BRAIN NATRIURETIC PEPTIDE - Abnormal; Notable for the following components:   B Natriuretic Peptide 740.6 (*)    All other components within normal limits  BASIC METABOLIC PANEL - Abnormal; Notable for the following components:   Glucose, Bld 338 (*)    All other components within normal limits  HEMOGLOBIN A1C - Abnormal; Notable for the following components:   Hgb A1c MFr Bld 14.4 (*)    All other components within normal limits  GLUCOSE, CAPILLARY - Abnormal; Notable for the following components:   Glucose-Capillary 281 (*)    All other components within normal limits  GLUCOSE, CAPILLARY - Abnormal; Notable for the following components:   Glucose-Capillary 289 (*)    All other components within normal limits  GLUCOSE, CAPILLARY - Abnormal; Notable for the following components:   Glucose-Capillary 145 (*)    All other components within normal limits  GLUCOSE, CAPILLARY - Abnormal; Notable for the following components:   Glucose-Capillary 150 (*)    All other components within normal limits  BASIC METABOLIC PANEL - Abnormal; Notable for the following components:   Potassium 2.7 (*)    Glucose, Bld 118 (*)    Calcium 8.8 (*)    All other components within normal limits  GLUCOSE, CAPILLARY - Abnormal; Notable for the following components:   Glucose-Capillary 207 (*)    All other components within normal limits  GLUCOSE, CAPILLARY - Abnormal; Notable for the following components:   Glucose-Capillary 119 (*)    All other components within normal limits  GLUCOSE, CAPILLARY - Abnormal; Notable for the following components:   Glucose-Capillary 143 (*)    All other components within normal limits  GLUCOSE, CAPILLARY - Abnormal; Notable for the following components:   Glucose-Capillary 202 (*)    All other components within normal limits  BASIC METABOLIC PANEL - Abnormal; Notable for the following components:   Potassium 3.3 (*)    Glucose, Bld 126 (*)     Creatinine, Ser 1.27 (*)    Calcium 8.8 (*)    All other components within normal limits  POTASSIUM - Abnormal; Notable for the following components:   Potassium 3.3 (*)    All other components within normal limits  GLUCOSE, CAPILLARY - Abnormal; Notable for the following components:   Glucose-Capillary 158 (*)    All other components within normal limits  GLUCOSE, CAPILLARY - Abnormal; Notable for the following components:   Glucose-Capillary 115 (*)    All other components within normal limits  GLUCOSE, CAPILLARY - Abnormal; Notable for the following components:   Glucose-Capillary 204 (*)    All other components within normal limits  GLUCOSE, CAPILLARY - Abnormal; Notable for the following components:   Glucose-Capillary 218 (*)    All other components within normal limits  BASIC METABOLIC PANEL - Abnormal; Notable for the following components:   Potassium 3.4 (*)    Glucose, Bld 146 (*)    Creatinine, Ser 1.32 (*)  Calcium 8.6 (*)    All other components within normal limits  MAGNESIUM - Abnormal; Notable for the following components:   Magnesium 1.6 (*)    All other components within normal limits  GLUCOSE, CAPILLARY - Abnormal; Notable for the following components:   Glucose-Capillary 223 (*)    All other components within normal limits  GLUCOSE, CAPILLARY - Abnormal; Notable for the following components:   Glucose-Capillary 131 (*)    All other components within normal limits  GLUCOSE, CAPILLARY - Abnormal; Notable for the following components:   Glucose-Capillary 120 (*)    All other components within normal limits  GLUCOSE, CAPILLARY - Abnormal; Notable for the following components:   Glucose-Capillary 191 (*)    All other components within normal limits  BASIC METABOLIC PANEL - Abnormal; Notable for the following components:   Glucose, Bld 145 (*)    Creatinine, Ser 1.39 (*)    Calcium 8.6 (*)    All other components within normal limits  GLUCOSE, CAPILLARY  - Abnormal; Notable for the following components:   Glucose-Capillary 213 (*)    All other components within normal limits  GLUCOSE, CAPILLARY - Abnormal; Notable for the following components:   Glucose-Capillary 162 (*)    All other components within normal limits  GLUCOSE, CAPILLARY - Abnormal; Notable for the following components:   Glucose-Capillary 179 (*)    All other components within normal limits  TROPONIN I (HIGH SENSITIVITY) - Abnormal; Notable for the following components:   Troponin I (High Sensitivity) 39 (*)    All other components within normal limits  TROPONIN I (HIGH SENSITIVITY) - Abnormal; Notable for the following components:   Troponin I (High Sensitivity) 43 (*)    All other components within normal limits  RESP PANEL BY RT-PCR (FLU A&B, COVID) [id number]  CBC WITH DIFFERENTIAL/PLATELET  HIV ANTIBODY (ROUTINE TESTING W REFLEX)  CBC  MAGNESIUM   ____________________________________________  EKG   EKG Interpretation  Date/Time:  Wednesday January 26 2021 16:46:24 EDT Ventricular Rate:  108 PR Interval:  148 QRS Duration: 138 QT Interval:  380 QTC Calculation: 509 R Axis:   -63 Text Interpretation: Sinus tachycardia with occasional Premature ventricular complexes Left axis deviation Non-specific intra-ventricular conduction block Minimal voltage criteria for LVH, may be normal variant ( Cornell product ) Possible Anterolateral infarct , age undetermined Abnormal ECG No significant change since last tracing Confirmed by Alona Bene [phone]) on 01/26/2021 9:13:52 PM        ____________________________________________  RADIOLOGY  CXR reviewed.   ____________________________________________   PROCEDURES  Procedure(s) performed:   Procedures  None  ____________________________________________   INITIAL IMPRESSION / ASSESSMENT AND PLAN / ED COURSE  Pertinent labs & imaging results that were available during my care of the patient were reviewed by  me and considered in my medical decision making (see chart for details).   Patient presents to the emergency department with shortness of breath, leg swelling, abdominal swelling.  Has history of systolic congestive heart failure as well as hypertension, diabetes.  Has been off of all medication.  He is subjectively short of breath with pulmonary vascular congestion on chest x-ray.  No pneumonia symptoms.  No chest pain to consider CAD or PE strongly plan for IV Lasix here.   Discussed patient's case with [facility] to request admission. Patient and family (if present) updated with plan. Care transferred to [facility] service.  I reviewed all nursing notes, vitals, pertinent old records, EKGs, labs, imaging (as available).  ____________________________________________  FINAL  CLINICAL IMPRESSION(S) / ED DIAGNOSES  Final diagnoses:  Acute pulmonary edema (HCC)  SOB (shortness of breath)     MEDICATIONS GIVEN DURING THIS VISIT:  Medications  furosemide (LASIX) injection 40 mg (40 mg Intravenous Given 01/26/21 2047)  potassium chloride SA (KLOR-CON) CR tablet 40 mEq (40 mEq Oral Given 01/26/21 2234)  potassium chloride SA (KLOR-CON) CR tablet 40 mEq (40 mEq Oral Given 01/28/21 1202)  magnesium oxide (MAG-OX) tablet 800 mg (800 mg Oral Given 01/30/21 0833)  magnesium sulfate IVPB 1 g 100 mL (1 g Intravenous New Bag/Given 01/30/21 1250)     NEW OUTPATIENT MEDICATIONS STARTED DURING THIS VISIT:  Discharge Medication List as of 01/31/2021  3:27 PM     START taking these medications   Details  Insulin Pen Needle 32G X 4 MM MISC Use to inject insulin as directed., Starting Mon 01/31/2021, Normal    mupirocin ointment (BACTROBAN) 2 % Apply topically daily. Cleanse right great toe wound with saline and pat dry. Apply mupirocin ointment to open nail bed.  Cover with dry dressing and tape. Change daily., Starting Tue 02/01/2021, Normal        Note:  This document was prepared using Dragon voice  recognition software and may include unintentional dictation errors.  Alona Bene, MD, [facility]    Tunya Held, Arlyss Repress, MD 02/02/21 [phone]

## 2021-01-27 ENCOUNTER — Observation Stay (HOSPITAL_COMMUNITY): Payer: Self-pay

## 2021-01-27 ENCOUNTER — Other Ambulatory Visit (HOSPITAL_COMMUNITY): Payer: Self-pay

## 2021-01-27 DIAGNOSIS — I5020 Unspecified systolic (congestive) heart failure: Secondary | ICD-10-CM | POA: Insufficient documentation

## 2021-01-27 DIAGNOSIS — I5023 Acute on chronic systolic (congestive) heart failure: Secondary | ICD-10-CM | POA: Insufficient documentation

## 2021-01-27 DIAGNOSIS — I5043 Acute on chronic combined systolic (congestive) and diastolic (congestive) heart failure: Secondary | ICD-10-CM

## 2021-01-27 LAB — CBC
HCT: 49.8 % (ref 39.0–52.0)
Hemoglobin: 16.4 g/dL (ref 13.0–17.0)
MCH: 29.7 pg (ref 26.0–34.0)
MCHC: 32.9 g/dL (ref 30.0–36.0)
MCV: 90.1 fL (ref 80.0–100.0)
Platelets: 215 10*3/uL (ref 150–400)
RBC: 5.53 MIL/uL (ref 4.22–5.81)
RDW: 13.2 % (ref 11.5–15.5)
WBC: 9 10*3/uL (ref 4.0–10.5)
nRBC: 0 % (ref 0.0–0.2)

## 2021-01-27 LAB — GLUCOSE, CAPILLARY
Glucose-Capillary: 289 mg/dL — ABNORMAL HIGH (ref 70–99)
Glucose-Capillary: 145 mg/dL — ABNORMAL HIGH (ref 70–99)
Glucose-Capillary: 150 mg/dL — ABNORMAL HIGH (ref 70–99)
Glucose-Capillary: 207 mg/dL — ABNORMAL HIGH (ref 70–99)

## 2021-01-27 LAB — HEMOGLOBIN A1C
Hgb A1c MFr Bld: 14.4 % — ABNORMAL HIGH (ref 4.8–5.6)
Mean Plasma Glucose: 366.58 mg/dL

## 2021-01-27 LAB — HIV ANTIBODY (ROUTINE TESTING W REFLEX): HIV Screen 4th Generation wRfx: NONREACTIVE

## 2021-01-27 LAB — BASIC METABOLIC PANEL
Anion gap: 11 (ref 5–15)
BUN: 10 mg/dL (ref 6–20)
CO2: 27 mmol/L (ref 22–32)
Calcium: 9 mg/dL (ref 8.9–10.3)
Chloride: 98 mmol/L (ref 98–111)
Creatinine, Ser: 1.2 mg/dL (ref 0.61–1.24)
GFR, Estimated: >60 mL/min (ref >60)
Glucose, Bld: 338 mg/dL — ABNORMAL HIGH (ref 70–99)
Potassium: 3.5 mmol/L (ref 3.5–5.1)
Sodium: 136 mmol/L (ref 135–145)

## 2021-01-27 LAB — ECHOCARDIOGRAM COMPLETE
Area-P 1/2: 4.6 cm2
Height: 70 in
S' Lateral: 5.5 cm
Single Plane A4C EF: 15 %
Weight: 5552 oz

## 2021-01-27 MED ORDER — MUPIROCIN 2 % EX OINT
TOPICAL_OINTMENT | Freq: Every day | CUTANEOUS | Status: DC
  Administered 2021-01-28 – 2021-01-31 (×3): 1 via TOPICAL
  Filled 2021-01-27 (×2): qty 22

## 2021-01-27 MED ORDER — SPIRONOLACTONE 25 MG PO TABS
25.0000 mg | ORAL_TABLET | Freq: Every day | ORAL | Status: DC
  Administered 2021-01-27 – 2021-01-31 (×5): 25 mg via ORAL
  Filled 2021-01-27 (×5): qty 1

## 2021-01-27 MED ORDER — CARVEDILOL 6.25 MG PO TABS
6.2500 mg | ORAL_TABLET | Freq: Two times a day (BID) | ORAL | Status: DC
  Administered 2021-01-27 – 2021-01-31 (×9): 6.25 mg via ORAL
  Filled 2021-01-27 (×9): qty 1

## 2021-01-27 MED ORDER — SACUBITRIL-VALSARTAN 49-51 MG PO TABS
1.0000 | ORAL_TABLET | Freq: Two times a day (BID) | ORAL | Status: DC
  Administered 2021-01-27 – 2021-01-31 (×9): 1 via ORAL
  Filled 2021-01-27 (×9): qty 1

## 2021-01-27 MED ORDER — PROSOURCE PLUS PO LIQD
30.0000 mL | Freq: Three times a day (TID) | ORAL | Status: DC
  Administered 2021-01-27 – 2021-01-31 (×11): 30 mL via ORAL
  Filled 2021-01-27 (×11): qty 30

## 2021-01-27 MED ORDER — EMPAGLIFLOZIN 10 MG PO TABS
10.0000 mg | ORAL_TABLET | Freq: Every day | ORAL | Status: DC
  Administered 2021-01-27 – 2021-01-31 (×5): 10 mg via ORAL
  Filled 2021-01-27 (×5): qty 1

## 2021-01-27 MED ORDER — ADULT MULTIVITAMIN W/MINERALS CH
1.0000 | ORAL_TABLET | Freq: Every day | ORAL | Status: DC
  Administered 2021-01-27 – 2021-01-31 (×5): 1 via ORAL
  Filled 2021-01-27 (×5): qty 1

## 2021-01-27 MED ORDER — NYSTATIN 100000 UNIT/GM EX POWD
CUTANEOUS | Status: DC | PRN
  Filled 2021-01-27: qty 15

## 2021-01-27 MED ORDER — FUROSEMIDE 10 MG/ML IJ SOLN
40.0000 mg | Freq: Two times a day (BID) | INTRAMUSCULAR | Status: DC
  Administered 2021-01-27 – 2021-01-29 (×4): 40 mg via INTRAVENOUS
  Filled 2021-01-27 (×4): qty 4

## 2021-01-27 NOTE — Progress Notes (Signed)
PROGRESS NOTE    Terry Young  QZR:[id number] DOB: 1970/09/15 DOA: 01/26/2021 PCP: Pcp, No   Chief Complaint  Patient presents with   Shortness of Breath   Brief Narrative/Hospital Course: 50 year old male with systolic CHF/and ICM with LVEF 15-20%, T2DM, HTN, nonadherent to medical therapy who ran out of his medication a months ago presents to the ED with shortness of breath lower leg edema all the way to the abdomen, progressively worsening over the past month and severe shortness of breath with minimal exertion. In the ED BNP 714, chest x-ray enlarged cardiac silhouette with vascular congestion, hypokalemia 3.2 troponin 39> 43, hyperglycemia 315.  Patient was found to be in acute systolic CHF exacerbation and admitted for further management Subjective: He says he moved to [place] and could not get meds refilled for a month Feels better have bene peeing a lot he says On bedside recliner, gets cough on laying flat  Assessment & Plan:  Acute exacerbatin of chronic systolic CHF: Presented w/ shortness of breath leg edema chest x-ray with pulmonary vascular congestion.  Triggered by not taking meds losartan not on meds. [id number]. Known lvef 15-20%. Consult cardiology.update echocardiogram.  Continue IV diuresis lasix 40 mg daily, nitroglycerin paste. Baseline wt:  in the setting 290s lb/132kg per patient > on admission 154.2KG. Monitor strict I/O,daly weight, electrolytes, Cont salt and fluid restricted diet. GDMT:per cardio.  Filed Weights   01/26/21 1645 01/27/21 0005  Weight: (!) 154.2 kg (!) 157.4 kg  Net IO Since Admission: -757 mL [01/27/21 0744]   Elevated troponin from demand ischemia due to CHF.  No delta. NICM hx HLD: Continue aspirin 81 Lipitor 40   DM2 poorly controlled. Hba1c in 14. On lantus 20 u bid, ssi- diabetic diet.  Monitor and titrate insulin Recent Labs  Lab 01/26/21 2351 01/27/21 0605  GLUCAP 281* 289*    BWL:[id number] controlled.On metoprolol, continue diuresis  versus meds as (hypertension) adjsut meds as above  Hypokalemia-resolved  Class III Obesity:Patient's Body mass index is 49.79 kg/m. : Will benefit with PCP follow-up, weight loss healthy lifestyle and outpatient sleep evaluation  DVT prophylaxis: Lovenox Code Status:   Code Status: Full Code Family Communication: plan of care discussed with patient  at bedside. Status is: admitted as Observation Patient remains hospitalized for ongoing management of acute on chronic systolic CHF with IV diuresis. Dispo: The patient is from: Home              Anticipated d/c is to: Home              Patient currently is not medically stable to d/c.   Difficult to place patient No Objective: Vitals: Today's Vitals   01/26/21 2230 01/26/21 2340 01/27/21 0005 01/27/21 0350  BP: (!) 152/139 (!) 159/101  (!) 141/100  Pulse:  94  86  Resp: 20 20  20   Temp:  98.5 F (36.9 C)  97.8 F (36.6 C)  TempSrc:  Oral  Oral  SpO2:  97%  98%  Weight:   (!) 157.4 kg   Height:  5\' 10"  (1.778 m)    PainSc:  0-No pain     Physical Examination: General exam: AA0x3, obese,older than stated age. HEENT:Oral mucosa moist, Ear/Nose WNL grossly,dentition normal. Respiratory system: B/l diminished BS, no use of accessory muscle, non tender. Cardiovascular system: S1 & S2 +,No JVD. Gastrointestinal system: Abdomen soft, NT,ND, BS+. Nervous System:Alert, awake, moving extremities. Extremities: edema LE upto thigh, distal peripheral pulses palpable.  Skin: No rashes, no icterus.  MSK: Normal muscle bulk, tone, power.  Medications reviewed:  Scheduled Meds:  aspirin EC  81 mg Oral Daily   atorvastatin  40 mg Oral Daily   enoxaparin (LOVENOX) injection  75 mg Subcutaneous QHS   furosemide  40 mg Intravenous Daily   insulin aspart  0-15 Units Subcutaneous TID WC   insulin glargine-yfgn  20 Units Subcutaneous QHS   metoprolol tartrate  25 mg Oral BID   nitroGLYCERIN  1 inch Topical Q6H  Continuous Infusions: Diet  Order             Diet heart healthy/carb modified Room service appropriate? Yes; Fluid consistency: Thin  Diet effective now                 Intake/Output  Intake/Output Summary (Last 24 hours) at 01/27/2021 0740 Last data filed at 01/27/2021 0351 Gross per 24 hour  Intake 243 ml  Output 1000 ml  Net -757 ml   Intake/Output from previous day: 10/12 0701 - 10/13 0700 In: 243 [P.O.:240] Out: 1000 [Urine:1000] Net IO Since Admission: -757 mL [01/27/21 0740]   Weight change:   Wt Readings from Last 3 Encounters:  01/27/21 (!) 157.4 kg    Filed Weights   01/26/21 1645 01/27/21 0005  Weight: (!) 154.2 kg (!) 157.4 kg    Consultants:see note  Procedures:see note Antimicrobials: Anti-infectives (From admission, onward)    None      Culture/Microbiology No results found for: SDES, SPECREQUEST, CULT, REPTSTATUS  Other culture-see note  Unresulted Labs (From admission, onward)     Start     Ordered   01/27/21 0500  Basic metabolic panel  Daily,   R      01/26/21 2201   01/26/21 2158  HIV Antibody (routine testing w rflx)  (HIV Antibody (Routine testing w reflex) panel)  Once,   R        01/26/21 2201           Data Reviewed: I have personally reviewed following labs and imaging studies CBC: Recent Labs  Lab 01/26/21 1655 01/27/21 0153  WBC 7.3 9.0  NEUTROABS 4.9  --   HGB 15.8 16.4  HCT 47.5 49.8  MCV 90.6 90.1  PLT 218 215   Basic Metabolic Panel: Recent Labs  Lab 01/26/21 1655 01/27/21 0153  NA 135 136  K 3.2* 3.5  CL 97* 98  CO2 26 27  GLUCOSE 315* 338*  BUN 11 10  CREATININE 1.22 1.20  CALCIUM 9.0 9.0   GFR: Estimated Creatinine Clearance: 115 mL/min (by C-G formula based on SCr of 1.2 mg/dL). Liver Function Tests: Recent Labs  Lab 01/26/21 1655  AST 32  ALT 38  ALKPHOS 89  BILITOT 1.7*  PROT 6.8  ALBUMIN 3.1*   No results for input(s): LIPASE, AMYLASE in the last 168 hours. No results for input(s): AMMONIA in the last 168  hours. Coagulation Profile: No results for input(s): INR, PROTIME in the last 168 hours. Cardiac Enzymes: No results for input(s): CKTOTAL, CKMB, CKMBINDEX, TROPONINI in the last 168 hours. BNP (last 3 results) No results for input(s): PROBNP in the last 8760 hours. HbA1C: Recent Labs    01/27/21 0153  HGBA1C 14.4*   CBG: Recent Labs  Lab 01/26/21 2351 01/27/21 0605  GLUCAP 281* 289*   Lipid Profile: No results for input(s): CHOL, HDL, LDLCALC, TRIG, CHOLHDL, LDLDIRECT in the last 72 hours. Thyroid Function Tests: No results for input(s): TSH, T4TOTAL, FREET4, T3FREE, THYROIDAB in the last 72  hours. Anemia Panel: No results for input(s): VITAMINB12, FOLATE, FERRITIN, TIBC, IRON, RETICCTPCT in the last 72 hours. Sepsis Labs: No results for input(s): PROCALCITON, LATICACIDVEN in the last 168 hours.  Recent Results (from the past 240 hour(s))  Resp Panel by RT-PCR (Flu A&B, Covid) Nasopharyngeal Swab     Status: None   Collection Time: 01/26/21  4:58 PM   Specimen: Nasopharyngeal Swab; Nasopharyngeal(NP) swabs in vial transport medium  Result Value Ref Range Status   SARS Coronavirus 2 by RT PCR NEGATIVE NEGATIVE Final    Comment: (NOTE) SARS-CoV-2 target nucleic acids are NOT DETECTED.  The SARS-CoV-2 RNA is generally detectable in upper respiratory specimens during the acute phase of infection. The lowest concentration of SARS-CoV-2 viral copies this assay can detect is 138 copies/mL. A negative result does not preclude SARS-Cov-2 infection and should not be used as the sole basis for treatment or other patient management decisions. A negative result may occur with  improper specimen collection/handling, submission of specimen other than nasopharyngeal swab, presence of viral mutation(s) within the areas targeted by this assay, and inadequate number of viral copies(<138 copies/mL). A negative result must be combined with clinical observations, patient history, and  epidemiological information. The expected result is Negative.  Fact Sheet for Patients:  BloggerCourse.com  Fact Sheet for Healthcare Providers:  SeriousBroker.it  This test is no t yet approved or cleared by the [place] FDA and  has been authorized for detection and/or diagnosis of SARS-CoV-2 by FDA under an Emergency Use Authorization (EUA). This EUA will remain  in effect (meaning this test can be used) for the duration of the COVID-19 declaration under Section 564(b)(1) of the Act, 21 U.S.C.section 360bbb-3(b)(1), unless the authorization is terminated  or revoked sooner.       Influenza A by PCR NEGATIVE NEGATIVE Final   Influenza B by PCR NEGATIVE NEGATIVE Final    Comment: (NOTE) The Xpert Xpress SARS-CoV-2/FLU/RSV plus assay is intended as an aid in the diagnosis of influenza from Nasopharyngeal swab specimens and should not be used as a sole basis for treatment. Nasal washings and aspirates are unacceptable for Xpert Xpress SARS-CoV-2/FLU/RSV testing.  Fact Sheet for Patients: BloggerCourse.com  Fact Sheet for Healthcare Providers: SeriousBroker.it  This test is not yet approved or cleared by the [place] FDA and has been authorized for detection and/or diagnosis of SARS-CoV-2 by FDA under an Emergency Use Authorization (EUA). This EUA will remain in effect (meaning this test can be used) for the duration of the COVID-19 declaration under Section 564(b)(1) of the Act, 21 U.S.C. section 360bbb-3(b)(1), unless the authorization is terminated or revoked.  Performed at [facility], [address], [place], [place] [address]      Radiology Studies: DG Chest 2 View  Result Date: 01/26/2021 CLINICAL DATA:  Shortness of breath, leg and abdomen swelling for 1.5 weeks, chest pain, history of fluid on the lungs EXAM: CHEST - 2 VIEW COMPARISON:   06/04/2020 FINDINGS: Enlargement of cardiac silhouette with pulmonary vascular congestion. Mediastinal contours normal. Minimal RIGHT basilar atelectasis. Lungs otherwise clear. No infiltrate, pleural effusion, or pneumothorax. IMPRESSION: Enlargement of cardiac silhouette with pulmonary vascular congestion. Minimal RIGHT basilar atelectasis. Electronically Signed   By: Ulyses Southward M.D.   On: 01/26/2021 18:27     LOS: 0 days   Lanae Boast, MD [facility]  01/27/2021, 7:40 AM

## 2021-01-27 NOTE — Consult Note (Signed)
WOC Nurse Consult Note: Reason for Consult: Nonhealing wound to right great toe.  Duration 2 weeks after fan was dropped on toe.  Nail fell off and there is now a defect in the nail bed with thick overgrown nail present.  All nails are long and unkempt We discussed self care with diabetes.  Recommend inspecting feet daily.  Use a mirror to inspect bottom of feet and between toes.   Recommend seeing a podiatrist to trim nails.  He has insurance that will begin in a month.   Wound type:trauma Pressure Injury POA: NA Measurement: 0.4 cm segment exposed nail bed to right great toe Wound bed:See above- Drainage (amount, consistency, odor) Minimal serosanguinous  no odor.  Periwound: Dry skin to legs and feet.  Edema present.  Dressing procedure/placement/frequency: Will implement Unna boots for lower leg edema, to be removed prior to discharge home.  Cleanse right great toe wound with NS and pat dry. Apply mupirocin ointment to open nail bed.  Cover with dry dressing and tape. Change daily.  Will not follow at this time.  Please re-consult if needed.  Maple Hudson MSN, RN, FNP-BC CWON Wound, Ostomy, Continence Nurse Pager [phone]

## 2021-01-27 NOTE — Discharge Instructions (Signed)
° °Heart Healthy, Consistent Carbohydrate Nutrition Therapy  ° °A heart-healthy and consistent carbohydrate diet is recommended to manage heart disease and diabetes. °To follow a heart-healthy and consistent carbohydrate diet, °Eat a balanced diet with whole grains, fruits and vegetables, and lean protein sources.  °Choose heart-healthy unsaturated fats. Limit saturated fats, trans fats, and cholesterol intake. Eat more plant-based or vegetarian meals using beans and soy foods for protein.  °Eat whole, unprocessed foods to limit the amount of sodium (salt) you eat.  °Choose a consistent amount of carbohydrate at each meal and snack. Limit refined carbohydrates especially sugar, sweets and sugar-sweetened beverages.  °If you drink alcohol, do so in moderation: one serving per day (women) and two servings per day (men). °o One serving is equivalent to 12 ounces beer, 5 ounces wine, or 1.5 ounces distilled spirits ° °Tips °Tips for Choosing Heart-Healthy Fats °Choose lean protein and low-fat dairy foods to reduce saturated fat intake. °Saturated fat is usually found in animal-based protein and is associated with certain health risks. Saturated fat is the biggest contributor to raise low-density lipoprotein (LDL) cholesterol levels. Research shows that limiting saturated fat lowers unhealthy cholesterol levels. Eat no more than 7% of your total calories each day from saturated fat. Ask your RDN to help you determine how much saturated fat is right for you. °There are many foods that do not contain large amounts of saturated fats. Swapping these foods to replace foods high in saturated fats will help you limit the saturated fat you eat and improve your cholesterol levels. You can also try eating more plant-based or vegetarian meals. °Instead of… Try:  °Whole milk, cheese, yogurt, and ice cream 1% or skim milk, low-fat cheese, non-fat yogurt, and low-fat ice cream  °Fatty, marbled beef and pork Lean beef, pork, or  venison  °Poultry with skin Poultry without skin  °Butter, stick margarine Reduced-fat, whipped, or liquid spreads  °Coconut oil, palm oil Liquid vegetable oils: corn, canola, olive, soybean and safflower oils  ° °Avoid foods that contain trans fats. °Trans fats increase levels of LDL-cholesterol. Hydrogenated fat in processed foods is the main source of trans fats in foods.  °Trans fats can be found in stick margarine, shortening, processed sweets, baked goods, some fried foods, and packaged foods made with hydrogenated oils. Avoid foods with “partially hydrogenated oil” on the ingredient list such as: cookies, pastries, baked goods, biscuits, crackers, microwave popcorn, and frozen dinners. °Choose foods with heart healthy fats. °Polyunsaturated and monounsaturated fat are unsaturated fats that may help lower your blood cholesterol level when used in place of saturated fat in your diet. °Ask your RDN about taking a dietary supplement with plant sterols and stanols to help lower your cholesterol level. °Research shows that substituting saturated fats with unsaturated fats is beneficial to cholesterol levels. Try these easy swaps: °Instead of… Try:  °Butter, stick margarine, or solid shortening Reduced-fat, whipped, or liquid spreads  °Beef, pork, or poultry with skin Fish and seafood  °Chips, crackers, snack foods Raw or unsalted nuts and seeds or nut butters °Hummus with vegetables °Avocado on toast  °Coconut oil, palm oil Liquid vegetable oils: corn, canola, olive, soybean and safflower oils  °Limit the amount of cholesterol you eat to less than 200 milligrams per day. °Cholesterol is a substance carried through the bloodstream via lipoproteins, which are known as “transporters” of fat. Some body functions need cholesterol to work properly, but too much cholesterol in the bloodstream can damage arteries and build up blood vessel   linings (which can lead to heart attack and stroke). You should eat less than 200  milligrams cholesterol per day. °People respond differently to eating cholesterol. There is no test available right now that can figure out which people will respond more to dietary cholesterol and which will respond less. For individuals with high intake of dietary cholesterol, different types of increase (none, small, moderate, large) in LDL-cholesterol levels are all possible.  °Food sources of cholesterol include egg yolks and organ meats such as liver, gizzards. Limit egg yolks to two to four per week and avoid organ meats like liver and gizzards to control cholesterol intake. °Tips for Choosing Heart-Healthy Carbohydrates °Consume a consistent amount of carbohydrate °It is important to eat foods with carbohydrates in moderation because they impact your blood glucose level. Carbohydrates can be found in many foods such as: °Grains (breads, crackers, rice, pasta, and cereals)  °Starchy Vegetables (potatoes, corn, and peas)  °Beans and legumes  °Milk, soy milk, and yogurt  °Fruit and fruit juice  °Sweets (cakes, cookies, ice cream, jam and jelly) °Your RDN will help you set a goal for how many carbohydrate servings to eat at your meals and snacks. For many adults, eating 3 to 5 servings of carbohydrate foods at each meal and 1 or 2 carbohydrate servings for each snack works well.  °Check your blood glucose level regularly. It can tell you if you need to adjust when you eat carbohydrates. °Choose foods rich in viscous (soluble) fiber °Viscous, or soluble, is found in the walls of plant cells. Viscous fiber is found only in plant-based foods. Eating foods with fiber helps to lower your unhealthy cholesterol and keep your blood glucose in range  °Rich sources of viscous fiber include vegetables (asparagus, Brussels sprouts, sweet potatoes, turnips) fruit (apricots, mangoes, oranges), legumes, and whole grains (barley, oats, and oat bran).  °As you increase your fiber intake gradually, also increase the amount of  water you drink. This will help prevent constipation.  °If you have difficulty achieving this goal, ask your RDN about fiber laxatives. Choose fiber supplements made with viscous fibers such as psyllium seed husks or methylcellulose to help lower unhealthy cholesterol.  °Limit refined carbohydrates  °There are three types of carbohydrates: starches, sugar, and fiber. Some carbohydrates occur naturally in food, like the starches in rice or corn or the sugars in fruits and milk. Refined carbohydrates--foods with high amounts of simple sugars--can raise triglyceride levels. High triglyceride levels are associated with coronary heart disease. °Some examples of refined carbohydrate foods are table sugar, sweets, and beverages sweetened with added sugar. °Tips for Reducing Sodium (Salt) °Although sodium is important for your body to function, too much sodium can be harmful for people with high blood pressure. As sodium and fluid buildup in your tissues and bloodstream, your blood pressure increases. High blood pressure may cause damage to other organs and increase your risk for a stroke. °Even if you take a pill for blood pressure or a water pill (diuretic) to remove fluid, it is still important to have less salt in your diet. Ask your doctor and RDN what amount of sodium is right for you. °Avoid processed foods. Eat more fresh foods.  °Fresh fruits and vegetables are naturally low in sodium, as well as frozen vegetables and fruits that have no added juices or sauces.  °Fresh meats are lower in sodium than processed meats, such as bacon, sausage, and hotdogs. Read the nutrition label or ask your butcher to help you find   a fresh meat that is low in sodium. °Eat less salt--at the table and when cooking.  °A single teaspoon of table salt has 2,300 mg of sodium.  °Leave the salt out of recipes for pasta, casseroles, and soups.  °Ask your RDN how to cook your favorite recipes without sodium °Be a smart shopper.  °Look for food  packages that say “salt-free” or “sodium-free.” These items contain less than 5 milligrams of sodium per serving.  °“Very low-sodium” products contain less than 35 milligrams of sodium per serving.  °“Low-sodium” products contain less than 140 milligrams of sodium per serving.  °Beware for “Unsalted” or “No Added Salt” products. These items may still be high in sodium. Check the nutrition label. °Add flavors to your food without adding sodium.  °Try lemon juice, lime juice, fruit juice or vinegar.  °Dry or fresh herbs add flavor. Try basil, bay leaf, dill, rosemary, parsley, sage, dry mustard, nutmeg, thyme, and paprika.  °Pepper, red pepper flakes, and cayenne pepper can add spice t your meals without adding sodium. Hot sauce contains sodium, but if you use just a drop or two, it will not add up to much.  °Buy a sodium-free seasoning blend or make your own at home. °Additional Lifestyle Tips °Achieve and maintain a healthy weight. °Talk with your RDN or your doctor about what is a healthy weight for you. °Set goals to reach and maintain that weight.  °To lose weight, reduce your calorie intake along with increasing your physical activity. A weight loss of 10 to 15 pounds could reduce LDL-cholesterol by 5 milligrams per deciliter. °Participate in physical activity. °Talk with your health care team to find out what types of physical activity are best for you. Set a plan to get about 30 minutes of exercise on most days. ° °Foods Recommended °Food Group Foods Recommended  °Grains Whole grain breads and cereals, including whole wheat, barley, rye, buckwheat, corn, teff, quinoa, millet, amaranth, brown or wild rice, sorghum, and oats °Pasta, especially whole wheat or other whole grain types  °Brown rice, quinoa or wild rice °Whole grain crackers, bread, rolls, pitas °Home-made bread with reduced-sodium baking soda  °Protein Foods Lean cuts of beef and pork (loin, leg, round, extra lean hamburger)  °Skinless  poultry °Fish °Venison and other wild game °Dried beans and peas °Nuts and nut butters °Meat alternatives made with soy or textured vegetable protein  °Egg whites or egg substitute °Cold cuts made with lean meat or soy protein  °Dairy Nonfat (skim), low-fat, or 1%-fat milk  °Nonfat or low-fat yogurt or cottage cheese °Fat-free and low-fat cheese  °Vegetables Fresh, frozen, or canned vegetables without added fat or salt   °Fruits Fresh, frozen, canned, or dried fruit   °Oils Unsaturated oils (corn, olive, peanut, soy, sunflower, canola)  °Soft or liquid margarines and vegetable oil spreads  °Salad dressings °Seeds and nuts  °Avocado  ° °Foods Not Recommended °Food Group Foods Not Recommended  °Grains Breads or crackers topped with salt °Cereals (hot or cold) with more than 300 mg sodium per serving °Biscuits, cornbread, and other “quick” breads prepared with baking soda °Bread crumbs or stuffing mix from a store °High-fat bakery products, such as doughnuts, biscuits, croissants, danish pastries, pies, cookies °Instant cooking foods to which you add hot water and stir--potatoes, noodles, rice, etc. °Packaged starchy foods--seasoned noodle or rice dishes, stuffing mix, macaroni and cheese dinner °Snacks made with partially hydrogenated oils, including chips, cheese puffs, snack mixes, regular crackers, butter-flavored popcorn  °Protein   Foods Higher-fat cuts of meats (ribs, t-bone steak, regular hamburger) °Bacon, sausage, or hot dogs °Cold cuts, such as salami or bologna, deli meats, cured meats, corned beef °Organ meats (liver, brains, gizzards, sweetbreads) °Poultry with skin °Fried or smoked meat, poultry, and fish °Whole eggs and egg yolks (more than 2-4 per week) °Salted legumes, nuts, seeds, or nut/seed butters °Meat alternatives with high levels of sodium (>300 mg per serving) or saturated fat (>5 g per serving)  °Dairy Whole milk,?2% fat milk, buttermilk °Whole milk yogurt or ice  cream °Cream °Half-&-half °Cream cheese °Sour cream °Cheese  °Vegetables Canned or frozen vegetables with salt, fresh vegetables prepared with salt, butter, cheese, or cream sauce °Fried vegetables °Pickled vegetables such as olives, pickles, or sauerkraut  °Fruits Fried fruits °Fruits served with butter or cream  °Oils Butter, stick margarine, shortening °Partially hydrogenated oils or trans fats °Tropical oils (coconut, palm, palm kernel oils)  °Other Candy, sugar sweetened soft drinks and desserts °Salt, sea salt, garlic salt, and seasoning mixes containing salt °Bouillon cubes °Ketchup, barbecue sauce, Worcestershire sauce, soy sauce, teriyaki sauce °Miso °Salsa °Pickles, olives, relish  ° °Heart Healthy Consistent Carbohydrate Vegetarian (Lacto-Ovo) Sample 1-Day Menu  °Breakfast 1 cup oatmeal, cooked (2 carbohydrate servings)  °¾ cup blueberries (1 carbohydrate serving)  °11 almonds, without salt  °1 cup 1% milk (1 carbohydrate serving)  °1 cup coffee  °Morning Snack 1 cup fat-free plain yogurt (1 carbohydrate serving)  °Lunch 1 whole wheat bun (1½ carbohydrate servings)  °1 black bean burger (1 carbohydrate servings)  °1 slice cheddar cheese, low sodium  °2 slices tomatoes  °2 leaves lettuce  °1 teaspoon mustard  °1 small pear (1½ carbohydrate servings)  °1 cup green tea, unsweetened  °Afternoon Snack 1/3 cup trail mix with nuts, seeds, and raisins, without salt (1½ carbohydrate servinga)  °Evening Meal ½ cup meatless chicken  °2/3 cup brown rice, cooked (2 carbohydrate servings)  °1 cup broccoli, cooked (2/3 carbohydrate serving)  °½ cup carrots, cooked (1/3 carbohydrate serving)  °2 teaspoons olive oil  °1 teaspoon balsamic vinegar  °1 whole wheat dinner roll (1 carbohydrate serving)  °1 teaspoon margarine, soft, tub  °1 cup 1% milk (1 carbohydrate serving)  °Evening Snack 1 extra small banana (1 carbohydrate serving)  °1 tablespoon peanut butter  ° °Heart Healthy Consistent Carbohydrate Vegan Sample 1-Day  Menu  °Breakfast 1 cup oatmeal, cooked (2 carbohydrate servings)  °¾ cup blueberries (1 carbohydrate serving)  °11 almonds, without salt  °1 cup soymilk fortified with calcium, vitamin B12, and vitamin D  °1 cup coffee  °Morning Snack 6 ounces soy yogurt (1½ carbohydrate servings)  °Lunch 1 whole wheat bun(1½ carbohydrate servings)  °1 black bean burger (1 carbohydrate serving)  °2 slices tomatoes  °2 leaves lettuce  °1 teaspoon mustard  °1 small pear (1½ carbohydrate servings)  °1 cup green tea, unsweetened  °Afternoon Snack 1/3 cup trail mix with nuts, seeds, and raisins, without salt (1½ carbohydrate servings)  °Evening Meal ½ cup meatless chicken  °2/3 cup brown rice, cooked (2 carbohydrate servings)  °1 cup broccoli, cooked (2/3 carbohydrate serving)  °½ cup carrots, cooked (1/3 carbohydrate serving)  °2 teaspoons olive oil  °1 teaspoon balsamic vinegar  °1 whole wheat dinner roll (1 carbohydrate serving)  °1 teaspoon margarine, soft, tub  °1 cup soymilk fortified with calcium, vitamin B12, and vitamin D  °Evening Snack 1 extra small banana (1 carbohydrate serving)  °1 tablespoon peanut butter   ° °Heart Healthy Consistent Carbohydrate   Sample 1-Day Menu  °Breakfast 1 cup cooked oatmeal (2 carbohydrate servings)  °3/4 cup blueberries (1 carbohydrate serving)  °1 ounce almonds  °1 cup skim milk (1 carbohydrate serving)  °1 cup coffee  °Morning Snack 1 cup sugar-free nonfat yogurt (1 carbohydrate serving)  °Lunch 2 slices whole-wheat bread (2 carbohydrate servings)  °2 ounces lean turkey breast  °1 ounce low-fat Swiss cheese  °1 teaspoon mustard  °1 slice tomato  °1 lettuce leaf  °1 small pear (1 carbohydrate serving)  °1 cup skim milk (1 carbohydrate serving)  °Afternoon Snack 1 ounce trail mix with unsalted nuts, seeds, and raisins (1 carbohydrate serving)  °Evening Meal 3 ounces salmon  °2/3 cup cooked brown rice (2 carbohydrate servings)  °1 teaspoon soft margarine  °1 cup cooked broccoli with 1/2 cup cooked  carrots (1 carbohydrate serving  °Carrots, cooked, boiled, drained, without salt  °1 cup lettuce  °1 teaspoon olive oil with vinegar for dressing  °1 small whole grain roll (1 carbohydrate serving)  °1 teaspoon soft margarine  °1 cup unsweetened tea  °Evening Snack 1 extra-small banana (1 carbohydrate serving)  °Copyright 2020 © Academy of Nutrition and Dietetics. All rights reserved.  ° °Plate Method for Diabetes  ° °Foods with carbohydrates make your blood glucose level go up. The plate method is a simple way to meal plan and control the amount of carbohydrate you eat. °    °    °Use the following guidance to build a healthy plate to control carbohydrates. Divide a 9-inch plate into 3 sections, and consider your beverage the 4th section of your meal: °Food Group Examples of Foods/Beverages for This Section of your Meal  °Section 1: Non-starchy vegetables °Fill ½ of your plate to include non-starchy vegetables Asparagus, broccoli, brussels sprouts, cabbage, carrots, cauliflower, celery, cucumber, green beans, mushrooms, peppers, salad greens, tomatoes, or zucchini.  °Section 2: Protein foods °Fill ¼ of your plate to include a lean protein Lean meat, poultry, fish, seafood, cheese, eggs, lean deli meat, tofu, beans, lentils, nuts or nut butters.  °Section 3: Carbohydrate foods °Fill ¼ of your plate to include carbohydrate foods Whole grains, whole wheat bread, brown rice, whole grain pasta, polenta, corn tortillas, fruit, or starchy vegetables (potatoes, green peas, corn, beans, acorn squash, and butternut squash). One cup of milk also counts as a food that contains carbohydrate.  °Section 4: Beverage °Choose water or a low-calorie drink for your beverage. Unsweetened tea, coffee, or flavored/sparkling water without added sugar.  °Image reprinted with permission from The American Diabetes Association.  Copyright 2022 by the American Diabetes Association. ° ° °Copyright 2022 © Academy of Nutrition and Dietetics. All  rights reserved   °

## 2021-01-27 NOTE — Progress Notes (Signed)
  Echocardiogram 2D Echocardiogram has been performed.  Delcie Roch 01/27/2021, 10:06 AM

## 2021-01-27 NOTE — Progress Notes (Addendum)
Inpatient Diabetes Program Recommendations  AACE/ADA: New Consensus Statement on Inpatient Glycemic Control (2015)  Target Ranges:  Prepandial:   less than 140 mg/dL      Peak postprandial:   less than 180 mg/dL (1-2 hours)      Critically ill patients:  140 - 180 mg/dL   Lab Results  Component Value Date   GLUCAP 289 (H) 01/27/2021   HGBA1C 14.4 (H) 01/27/2021    Review of Glycemic Control Results for Terry Young, Terry Young (MRN [id number]) as of 01/27/2021 09:43  Ref. Range 01/26/2021 23:51 01/27/2021 06:05  Glucose-Capillary Latest Ref Range: 70 - 99 mg/dL 237 (H) 628 (H)   Diabetes history: Type 2 DM Outpatient Diabetes medications: Basaglar 30 units QHS?, Metformin 1000 mg BID Current orders for Inpatient glycemic control: Novolog 0-15 units TID, Semglee 20 units QHS  Inpatient Diabetes Program Recommendations:    Consider increasing Semglee to 30 units QHS.   Will plan to see.   Addendum: Spoke with patient regarding outpatient diabetes management. Recently, was started on Basaglar by PCP and was given samples. Plans to have insurance by the end of month.  Reviewed patient's current A1c of 14.4%. Explained what a A1c is and what it measures. Also reviewed goal A1c with patient, importance of good glucose control @ home, and blood sugar goals. Reviewed patho of DM, need for insulin, role of pancreas, impact of poor glycemic control on heart, vascular changes and commorbidities. Patient moved and needs a meter. Provided to patient.  Reviewed when to check CBGs and when to call MD. Will place TOc consult to help ensure PCP follow up. Also, discussed back-up plan in the event samples for insulin run out prior to obtaining insurance.  Admits to drinking sodas. Reviewed with patient alternatives and encouraged mindfulness of CHO. Patient reports working in [employer] and familiar with carb modified diet, however, will place dietitian consult to reinforce.  Patient has no further  questions at this time.   Thanks, Lujean Rave, MSN, RNC-OB Diabetes Coordinator [phone] (8a-5p)

## 2021-01-27 NOTE — Progress Notes (Signed)
Initial Nutrition Assessment  DOCUMENTATION CODES:   Morbid obesity  INTERVENTION:   -30 ml Prosource Plus TID, each supplement provides 100 kcals and 15 grams protein  -MVI with minerals daily -Provided "Heart Healthy, Consistent Carbohydrate Nutrition Therapy" and "Plate Method" handout from [facility] Nutrition Care Manual; attached to AVS/ discharge summary -Refer to [facility] for further support and reinforcement  NUTRITION DIAGNOSIS:   Increased nutrient needs related to wound healing as evidenced by estimated needs.  GOAL:   Patient will meet greater than or equal to 90% of their needs  MONITOR:   PO intake, Supplement acceptance, Diet advancement, Labs, Weight trends, Skin, I & O's  REASON FOR ASSESSMENT:   Consult Diet education  ASSESSMENT:   50 year old male with systolic CHF/and ICM with LVEF 15-20%, T2DM, HTN, nonadherent to medical therapy who ran out of his medication a months ago presents to the ED with shortness of breath lower leg edema all the way to the abdomen, progressively worsening over the past month and severe shortness of breath with minimal exertion.  Pt admitted with CHF and hypertensive urgency.   Reviewed I/O's: -757 ml x 24 hours  UOP: 1 L x 24 hours   Pt unavailable at time of visit. Attempted to speak with pt via call to hospital room phone, however, unable to reach. RD unable to obtain further nutrition-related history or complete nutrition-focused physical exam at this time.    Per chart review, pt recently moved to [facility] and ran out of medications. He will obtain insurance by the end of the month. He was receiving samples of basaglar from PCP's office.   Per [facility] note, pt with non healing trauma wound to rt great toe as a result of a fan being dropped on his toe (has defect in nail bed and thick overgrown nail present).   Pt with good appetite. Noted meal completion 90-100%.    Medications reviewed and include lasix and aldactone.  Obesity is a complex, chronic medical condition that is optimally managed by a multidisciplinary care team. Weight loss is not an ideal goal for an acute inpatient hospitalization. However, if further work-up for obesity is warranted, consider outpatient referral to outpatient bariatric service and/or [facility].    Pt with increased nutritional needs for wound healing and would benefit from addition of oral nutrition supplements.   Lab Results  Component Value Date   HGBA1C 14.4 (H) 01/27/2021   PTA DM medications are 1000 mg metformin BID and 30 units insulin glargine daily.   Labs reviewed: CBGS: 145-289 (inpatient orders for glycemic control are 10 mg empagliflozin daily, 0-15 units insulin aspart TID with meals and 20 units inuslin glargine-yfgn daily at bedtime).    Diet Order:   Diet Order             Diet heart healthy/carb modified Room service appropriate? Yes; Fluid consistency: Thin  Diet effective now                   EDUCATION NEEDS:   No education needs have been identified at this time  Skin:  Skin Assessment: Skin Integrity Issues: Skin Integrity Issues:: Other (Comment) Other: non-healing trauma wound to rt great toe  Last BM:  01/25/21  Height:   Ht Readings from Last 1 Encounters:  01/26/21 5\' 10"  (1.778 m)    Weight:   Wt Readings from Last 1 Encounters:  01/27/21 (!) 157.4 kg  Ideal Body Weight:  75.5 kg  BMI:  Body mass index is 49.79 kg/m.  Estimated Nutritional Needs:   Kcal:  0347-4259  Protein:  120-150 grams  Fluid:  > 2 L    Levada Schilling, RD, LDN, CDCES Registered Dietitian II Certified Diabetes Care and Education Specialist Please refer to [facility] for RD and/or RD on-call/weekend/after hours pager

## 2021-01-27 NOTE — Consult Note (Signed)
Reason for Consult: Decompensated systolic congestive heart failure Referring Physician: [facility] hospitalist  Terry Young is an 50 y.o. male.  HPI: Patient is 50 year old male with past medical history significant for systolic congestive heart failure resident of [facility] moved here approximately 2 years ago, hypertension, diabetes mellitus, morbid obesity, was admitted yesterday because of progressive increasing shortness of breath associated with worsening leg swelling extending to the thigh and abdomen for last few weeks.  Patient states he ran out of his medications recently.  Patient states he has gained approximately 50+ pounds in last 2 months denies any chest pain nausea or vomiting or diaphoresis.  Patient does give history of PND orthopnea and worsening swelling as above.  Denies palpitation lightheadedness or syncope.  States had extensive work-up in [facility] records not available.  Past Medical History:  Diagnosis Date   CHF (congestive heart failure) (HCC)    Diabetes mellitus without complication (HCC)    Hypertension     History reviewed. No pertinent surgical history.  Family History  Problem Relation Age of Onset   Atrial fibrillation Neg Hx     Social History:  reports that he has never smoked. He has never used smokeless tobacco. He reports that he does not drink alcohol and does not use drugs.  Allergies:  Allergies  Allergen Reactions   Lisinopril Cough    Medications: I have reviewed the patient's current medications.  Results for orders placed or performed during the hospital encounter of 01/26/21 (from the past 48 hour(s))  Troponin I (High Sensitivity)     Status: Abnormal   Collection Time: 01/26/21  4:55 PM  Result Value Ref Range   Troponin I (High Sensitivity) 39 (H) <18 ng/L    Comment: (NOTE) Elevated high sensitivity troponin I (hsTnI) values and significant  changes across serial measurements may suggest ACS but many  other  chronic and acute conditions are known to elevate hsTnI results.  Refer to the "Links" section for chest pain algorithms and additional  guidance. Performed at [facility], [address] [place], [place] [address]   CBC with Differential     Status: None   Collection Time: 01/26/21  4:55 PM  Result Value Ref Range   WBC 7.3 4.0 - 10.5 K/uL   RBC 5.24 4.22 - 5.81 MIL/uL   Hemoglobin 15.8 13.0 - 17.0 g/dL   HCT 29.4 76.5 - 46.5 %   MCV 90.6 80.0 - 100.0 fL   MCH 30.2 26.0 - 34.0 pg   MCHC 33.3 30.0 - 36.0 g/dL   RDW 03.5 46.5 - 68.1 %   Platelets 218 150 - 400 K/uL   nRBC 0.0 0.0 - 0.2 %   Neutrophils Relative % 67 %   Neutro Abs 4.9 1.7 - 7.7 K/uL   Lymphocytes Relative 21 %   Lymphs Abs 1.5 0.7 - 4.0 K/uL   Monocytes Relative 9 %   Monocytes Absolute 0.6 0.1 - 1.0 K/uL   Eosinophils Relative 2 %   Eosinophils Absolute 0.1 0.0 - 0.5 K/uL   Basophils Relative 1 %   Basophils Absolute 0.1 0.0 - 0.1 K/uL   Immature Granulocytes 0 %   Abs Immature Granulocytes 0.02 0.00 - 0.07 K/uL    Comment: Performed at [facility], [address], [place], [place] [address]  Comprehensive metabolic panel     Status: Abnormal   Collection Time: 01/26/21  4:55 PM  Result Value Ref Range   Sodium  135 135 - 145 mmol/L   Potassium 3.2 (L) 3.5 - 5.1 mmol/L   Chloride 97 (L) 98 - 111 mmol/L   CO2 26 22 - 32 mmol/L   Glucose, Bld 315 (H) 70 - 99 mg/dL    Comment: Glucose reference range applies only to samples taken after fasting for at least 8 hours.   BUN 11 6 - 20 mg/dL   Creatinine, Ser 6.38 0.61 - 1.24 mg/dL   Calcium 9.0 8.9 - 46.6 mg/dL   Total Protein 6.8 6.5 - 8.1 g/dL   Albumin 3.1 (L) 3.5 - 5.0 g/dL   AST 32 15 - 41 U/L   ALT 38 0 - 44 U/L   Alkaline Phosphatase 89 38 - 126 U/L   Total Bilirubin 1.7 (H) 0.3 - 1.2 mg/dL   GFR, Estimated >59 >93 mL/min    Comment: (NOTE) Calculated using the CKD-EPI Creatinine Equation (2021)    Anion gap 12 5 - 15     Comment: Performed at [facility], [address] [place], [place] [address]  Brain natriuretic peptide     Status: Abnormal   Collection Time: 01/26/21  4:55 PM  Result Value Ref Range   B Natriuretic Peptide 740.6 (H) 0.0 - 100.0 pg/mL    Comment: Performed at [facility], [address], [place], [place] [address]  Resp Panel by RT-PCR (Flu A&B, Covid) Nasopharyngeal Swab     Status: None   Collection Time: 01/26/21  4:58 PM   Specimen: Nasopharyngeal Swab; Nasopharyngeal(NP) swabs in vial transport medium  Result Value Ref Range   SARS Coronavirus 2 by RT PCR NEGATIVE NEGATIVE    Comment: (NOTE) SARS-CoV-2 target nucleic acids are NOT DETECTED.  The SARS-CoV-2 RNA is generally detectable in upper respiratory specimens during the acute phase of infection. The lowest concentration of SARS-CoV-2 viral copies this assay can detect is 138 copies/mL. A negative result does not preclude SARS-Cov-2 infection and should not be used as the sole basis for treatment or other patient management decisions. A negative result may occur with  improper specimen collection/handling, submission of specimen other than nasopharyngeal swab, presence of viral mutation(s) within the areas targeted by this assay, and inadequate number of viral copies(<138 copies/mL). A negative result must be combined with clinical observations, patient history, and epidemiological information. The expected result is Negative.  Fact Sheet for Patients:  BloggerCourse.com  Fact Sheet for Healthcare Providers:  SeriousBroker.it  This test is no t yet approved or cleared by the [place] FDA and  has been authorized for detection and/or diagnosis of SARS-CoV-2 by FDA under an Emergency Use Authorization (EUA). This EUA will remain  in effect (meaning this test can be used) for the duration of the COVID-19 declaration under Section 564(b)(1) of the Act,  21 U.S.C.section 360bbb-3(b)(1), unless the authorization is terminated  or revoked sooner.       Influenza A by PCR NEGATIVE NEGATIVE   Influenza B by PCR NEGATIVE NEGATIVE    Comment: (NOTE) The Xpert Xpress SARS-CoV-2/FLU/RSV plus assay is intended as an aid in the diagnosis of influenza from Nasopharyngeal swab specimens and should not be used as a sole basis for treatment. Nasal washings and aspirates are unacceptable for Xpert Xpress SARS-CoV-2/FLU/RSV testing.  Fact Sheet for Patients: BloggerCourse.com  Fact Sheet for Healthcare Providers: SeriousBroker.it  This test is not yet approved or cleared by the [place] FDA and has been authorized for detection and/or diagnosis of SARS-CoV-2 by FDA  under an Emergency Use Authorization (EUA). This EUA will remain in effect (meaning this test can be used) for the duration of the COVID-19 declaration under Section 564(b)(1) of the Act, 21 U.S.C. section 360bbb-3(b)(1), unless the authorization is terminated or revoked.  Performed at [facility], [address], [place], [place] [address]   Troponin I (High Sensitivity)     Status: Abnormal   Collection Time: 01/26/21  6:43 PM  Result Value Ref Range   Troponin I (High Sensitivity) 43 (H) <18 ng/L    Comment: (NOTE) Elevated high sensitivity troponin I (hsTnI) values and significant  changes across serial measurements may suggest ACS but many other  chronic and acute conditions are known to elevate hsTnI results.  Refer to the "Links" section for chest pain algorithms and additional  guidance. Performed at [facility], [address], [place], [place] [address]   Glucose, capillary     Status: Abnormal   Collection Time: 01/26/21 11:51 PM  Result Value Ref Range   Glucose-Capillary 281 (H) 70 - 99 mg/dL    Comment: Glucose reference range applies only to samples taken after fasting for at least 8 hours.    Comment 1 Notify RN    Comment 2 Document in Chart   Basic metabolic panel     Status: Abnormal   Collection Time: 01/27/21  1:53 AM  Result Value Ref Range   Sodium 136 135 - 145 mmol/L   Potassium 3.5 3.5 - 5.1 mmol/L   Chloride 98 98 - 111 mmol/L   CO2 27 22 - 32 mmol/L   Glucose, Bld 338 (H) 70 - 99 mg/dL    Comment: Glucose reference range applies only to samples taken after fasting for at least 8 hours.   BUN 10 6 - 20 mg/dL   Creatinine, Ser 0.98 0.61 - 1.24 mg/dL   Calcium 9.0 8.9 - 11.9 mg/dL   GFR, Estimated >14 >78 mL/min    Comment: (NOTE) Calculated using the CKD-EPI Creatinine Equation (2021)    Anion gap 11 5 - 15    Comment: Performed at [facility], [address] [place], [place] [address]  CBC     Status: None   Collection Time: 01/27/21  1:53 AM  Result Value Ref Range   WBC 9.0 4.0 - 10.5 K/uL   RBC 5.53 4.22 - 5.81 MIL/uL   Hemoglobin 16.4 13.0 - 17.0 g/dL   HCT 13.0 86.5 - 78.4 %   MCV 90.1 80.0 - 100.0 fL   MCH 29.7 26.0 - 34.0 pg   MCHC 32.9 30.0 - 36.0 g/dL   RDW 69.6 29.5 - 28.4 %   Platelets 215 150 - 400 K/uL   nRBC 0.0 0.0 - 0.2 %    Comment: Performed at [facility], [address], [place], [place] [address]  Hemoglobin A1c     Status: Abnormal   Collection Time: 01/27/21  1:53 AM  Result Value Ref Range   Hgb A1c MFr Bld 14.4 (H) 4.8 - 5.6 %    Comment: (NOTE) Pre diabetes:          5.7%-6.4%  Diabetes:              >6.4%  Glycemic control for   <7.0% adults with diabetes    Mean Plasma Glucose 366.58 mg/dL    Comment: Performed at [facility], [address], [place], [place] [address]  Glucose, capillary  Status: Abnormal   Collection Time: 01/27/21  6:05 AM  Result Value Ref Range   Glucose-Capillary 289 (H) 70 - 99 mg/dL    Comment: Glucose reference range applies only to samples taken after fasting for at least 8 hours.   Comment 1 Notify RN    Comment 2 Document in Chart   Glucose, capillary      Status: Abnormal   Collection Time: 01/27/21 11:21 AM  Result Value Ref Range   Glucose-Capillary 145 (H) 70 - 99 mg/dL    Comment: Glucose reference range applies only to samples taken after fasting for at least 8 hours.    DG Chest 2 View  Result Date: 01/26/2021 CLINICAL DATA:  Shortness of breath, leg and abdomen swelling for 1.5 weeks, chest pain, history of fluid on the lungs EXAM: CHEST - 2 VIEW COMPARISON:  06/04/2020 FINDINGS: Enlargement of cardiac silhouette with pulmonary vascular congestion. Mediastinal contours normal. Minimal RIGHT basilar atelectasis. Lungs otherwise clear. No infiltrate, pleural effusion, or pneumothorax. IMPRESSION: Enlargement of cardiac silhouette with pulmonary vascular congestion. Minimal RIGHT basilar atelectasis. Electronically Signed   By: Ulyses Southward M.D.   On: 01/26/2021 18:27    Review of Systems  Constitutional:  Negative for diaphoresis and fever.  HENT:  Negative for sore throat.   Respiratory:  Positive for shortness of breath.   Cardiovascular:  Positive for leg swelling. Negative for chest pain and palpitations.  Gastrointestinal:  Positive for abdominal distention.  Genitourinary:  Negative for difficulty urinating.  Musculoskeletal:  Negative for arthralgias.  Neurological:  Negative for dizziness and facial asymmetry.  Blood pressure (!) 144/103, pulse 72, temperature 98.3 F (36.8 C), temperature source Oral, resp. rate 19, height 5\' 10"  (1.778 m), weight (!) 157.4 kg, SpO2 97 %. Physical Exam Constitutional:      Appearance: He is well-developed. He is obese.  HENT:     Head: Normocephalic and atraumatic.  Eyes:     Extraocular Movements: Extraocular movements intact.     Pupils: Pupils are equal, round, and reactive to light.  Neck:     Vascular: JVD present.  Cardiovascular:     Rate and Rhythm: Normal rate and regular rhythm.     Heart sounds: Murmur (Soft systolic murmur and S3 gallop noted) heard.  Pulmonary:      Comments: Decreased breath sound at bases with faint Rales noted Abdominal:     General: Bowel sounds are normal.     Palpations: Abdomen is soft.  Musculoskeletal:     Cervical back: Normal range of motion and neck supple.     Comments: No clubbing cyanosis 4+ edema noted  Neurological:     General: No focal deficit present.     Mental Status: He is alert.    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 Status post hypertensive urgency Minimally elevated high-sensitivity troponin secondary to demand ischemia doubt significant MI Uncontrolled diabetes mellitus Morbid obesity Hypokalemia secondary to diuretics Plan Increase IV Lasix to 40 mg twice daily Start K-Dur 20 mEq twice daily Change metoprolol to carvedilol 6.25 mg twice daily Start Entresto 49/51 mg twice daily Start Aldactone 25 mg daily Start Jardiance 10 mg daily Strict INO's and daily weight We will check old records from [facility] Replace K Discussed with patient at length regarding compliance with medications, diet follow-up lifestyle changes [facility] 01/27/2021, 11:37 AM

## 2021-01-27 NOTE — TOC Benefit Eligibility Note (Signed)
Patient Advocate Encounter  Insurance verification completed.    The patient is uninsured  Jonathen Rathman, CPhT Pharmacy Patient Advocate Specialist [place] [facility] Stewardship Team Direct Number: [phone]  Fax: [phone]        

## 2021-01-28 LAB — BASIC METABOLIC PANEL
Anion gap: 12 (ref 5–15)
BUN: 11 mg/dL (ref 6–20)
CO2: 27 mmol/L (ref 22–32)
Calcium: 8.8 mg/dL — ABNORMAL LOW (ref 8.9–10.3)
Chloride: 99 mmol/L (ref 98–111)
Creatinine, Ser: 1.23 mg/dL (ref 0.61–1.24)
GFR, Estimated: >60 mL/min (ref >60)
Glucose, Bld: 118 mg/dL — ABNORMAL HIGH (ref 70–99)
Potassium: 2.7 mmol/L — CL (ref 3.5–5.1)
Sodium: 138 mmol/L (ref 135–145)

## 2021-01-28 LAB — POTASSIUM: Potassium: 3.3 mmol/L — ABNORMAL LOW (ref 3.5–5.1)

## 2021-01-28 LAB — GLUCOSE, CAPILLARY
Glucose-Capillary: 119 mg/dL — ABNORMAL HIGH (ref 70–99)
Glucose-Capillary: 143 mg/dL — ABNORMAL HIGH (ref 70–99)
Glucose-Capillary: 202 mg/dL — ABNORMAL HIGH (ref 70–99)
Glucose-Capillary: 158 mg/dL — ABNORMAL HIGH (ref 70–99)

## 2021-01-28 MED ORDER — POTASSIUM CHLORIDE CRYS ER 20 MEQ PO TBCR
40.0000 meq | EXTENDED_RELEASE_TABLET | ORAL | Status: AC
  Administered 2021-01-28 (×2): 40 meq via ORAL
  Filled 2021-01-28: qty 2
  Filled 2021-01-28: qty 4

## 2021-01-28 MED ORDER — POTASSIUM CHLORIDE CRYS ER 20 MEQ PO TBCR
20.0000 meq | EXTENDED_RELEASE_TABLET | Freq: Every day | ORAL | Status: DC
  Administered 2021-01-28: 20 meq via ORAL
  Filled 2021-01-28: qty 1
  Filled 2021-01-28: qty 2

## 2021-01-28 NOTE — Progress Notes (Signed)
PROGRESS NOTE    Terry Young  GUY:[id number] DOB: 12/02/1970 DOA: 01/26/2021 PCP: Pcp, No   Chief Complaint  Patient presents with   Shortness of Breath   Brief Narrative/Hospital Course: 50 year old male with systolic CHF/and ICM with LVEF 15-20%, T2DM, HTN, nonadherent to medical therapy who ran out of his medication a months ago presents to the ED with shortness of breath lower leg edema all the way to the abdomen, progressively worsening over the past month and severe shortness of breath with minimal exertion. In the ED BNP 714, chest x-ray enlarged cardiac silhouette with vascular congestion, hypokalemia 3.2 troponin 39> 43, hyperglycemia 315.  Patient was found to be in acute systolic CHF exacerbation and admitted for further management. Dr Sharyn Lull from cardio consulted- initiating GDMT  Subjective: Seen and examined.  Patient is ambulatory in the room and shortness of breath lection is improving Afebrile overnight Urine output adequate 5525 ml Weight is improving 157> 148 kg  Assessment & Plan:  Acute biventricular systolic CHF: Presented w/ shortness of breath leg edema chest x-ray with pulmonary vascular congestion.  Triggered by not taking meds.[id number]. Known lvef 15-20%.  Echo shows EF less than 20%, global hypokinesis G2DD, severely reduced RV systolic function,moderately elevated pulmonary systolic pressure. Appreciate cardiology consult. Having adequate urine output and weight is improving.  baseline wt: 290s lb/132kg per patient > on admission 154.2KG.  Monitor strict I/O,daly weight, electrolytes, Cont salt and fluid restricted diet. Continue IV diuresis lasix 40 mg bid. GDMT:per cardio-Lopressor switched to Coreg 6.25, started Entresto, Aldactone, jardiane.  Filed Weights   01/26/21 1645 01/27/21 0005 01/28/21 0419  Weight: (!) 154.2 kg (!) 157.4 kg (!) 148 kg  Net IO Since Admission: -5,448 mL [01/28/21 1004]    Elevated troponin from demand ischemia due to CHF.  No  delta. NICM hx HLD: Continue aspirin 81 Lipitor 40   DM2 poorly controlled. Hba1c in 14. On lantus 20 u bid, ssi- diabetic diet.  Continue current insulin and monitor. Recent Labs  Lab 01/27/21 0605 01/27/21 1121 01/27/21 1619 01/27/21 2144 01/28/21 0601  GLUCAP 289* 145* 150* 207* 119*    IEP:PIRJJOACZY controlled.now on Coreg Entresto Aldactone monitor and optimize   Hypokalemia-due to Lasix, replete aggressively recheck this afternoon.  Keep on 20 of kdur bid  Class III Obesity:Patient's Body mass index is 46.82 kg/m. : Will benefit with PCP follow-up, weight loss healthy lifestyle and outpatient sleep evaluation  DVT prophylaxis: Lovenox Code Status:   Code Status: Full Code Family Communication: plan of care discussed with patient  at bedside. Status is: inpatient Patient remains hospitalized for ongoing management of acute on chronic systolic CHF with IV diuresis. Dispo: The patient is from: Home              Anticipated d/c is to: Home              Patient currently is not medically stable to d/c.   Difficult to place patient No Objective: Vitals: Today's Vitals   01/27/21 2100 01/28/21 0419 01/28/21 0800 01/28/21 0910  BP:  125/89  (!) 148/101  Pulse:  91  93  Resp:  20    Temp:  98.5 F (36.9 C)    TempSrc:  Oral    SpO2:  95%    Weight:  (!) 148 kg    Height:      PainSc: 0-No pain  0-No pain    Physical Examination: General exam: AAOx 3,older than stated age, weak appearing. HEENT:Oral mucosa moist,  Ear/Nose WNL grossly, dentition normal. Respiratory system: bilaterally diminished, no use of accessory muscle Cardiovascular system: S1 & S2 +, No JVD,. Gastrointestinal system: Abdomen soft, obese NT,ND, BS+ Nervous System:Alert, awake, moving extremities and grossly nonfocal Extremities: Significant lower leg edema up to thigh, wrapping of lower extremities.  Skin: No rashes,no icterus. MSK: Normal muscle bulk,tone, power   Medications reviewed:   Scheduled Meds:  (feeding supplement) PROSource Plus  30 mL Oral TID BM   aspirin EC  81 mg Oral Daily   atorvastatin  40 mg Oral Daily   carvedilol  6.25 mg Oral BID WC   empagliflozin  10 mg Oral Daily   enoxaparin (LOVENOX) injection  75 mg Subcutaneous QHS   furosemide  40 mg Intravenous BID   insulin aspart  0-15 Units Subcutaneous TID WC   insulin glargine-yfgn  20 Units Subcutaneous QHS   multivitamin with minerals  1 tablet Oral Daily   mupirocin ointment   Topical Daily   potassium chloride  20 mEq Oral Daily   potassium chloride  40 mEq Oral Q4H   sacubitril-valsartan  1 tablet Oral BID   spironolactone  25 mg Oral Daily  Continuous Infusions: Diet Order             Diet heart healthy/carb modified Room service appropriate? Yes; Fluid consistency: Thin  Diet effective now                 Intake/Output  Intake/Output Summary (Last 24 hours) at 01/28/2021 1004 Last data filed at 01/28/2021 0422 Gross per 24 hour  Intake 597 ml  Output 5525 ml  Net -4928 ml   Intake/Output from previous day: 10/13 0701 - 10/14 0700 In: 834 [P.O.:834] Out: 5525 [Urine:5525] Net IO Since Admission: -5,448 mL [01/28/21 1004]   Weight change: -6.214 kg  Wt Readings from Last 3 Encounters:  01/28/21 (!) 148 kg    Filed Weights   01/26/21 1645 01/27/21 0005 01/28/21 0419  Weight: (!) 154.2 kg (!) 157.4 kg (!) 148 kg    Consultants:see note  Procedures:see note Antimicrobials: Anti-infectives (From admission, onward)    None      Culture/Microbiology No results found for: SDES, SPECREQUEST, CULT, REPTSTATUS  Other culture-see note  Unresulted Labs (From admission, onward)     Start     Ordered   01/28/21 1600  Potassium  Once-Timed,   TIMED        01/28/21 1003   01/27/21 0500  Basic metabolic panel  Daily,   R      01/26/21 2201           Data Reviewed: I have personally reviewed following labs and imaging studies CBC: Recent Labs  Lab 01/26/21 1655  01/27/21 0153  WBC 7.3 9.0  NEUTROABS 4.9  --   HGB 15.8 16.4  HCT 47.5 49.8  MCV 90.6 90.1  PLT 218 215   Basic Metabolic Panel: Recent Labs  Lab 01/26/21 1655 01/27/21 0153 01/28/21 0342  NA 135 136 138  K 3.2* 3.5 2.7*  CL 97* 98 99  CO2 26 27 27   GLUCOSE 315* 338* 118*  BUN 11 10 11   CREATININE 1.22 1.20 1.23  CALCIUM 9.0 9.0 8.8*   GFR: Estimated Creatinine Clearance: 108.2 mL/min (by C-G formula based on SCr of 1.23 mg/dL). Liver Function Tests: Recent Labs  Lab 01/26/21 1655  AST 32  ALT 38  ALKPHOS 89  BILITOT 1.7*  PROT 6.8  ALBUMIN 3.1*   No results for  input(s): LIPASE, AMYLASE in the last 168 hours. No results for input(s): AMMONIA in the last 168 hours. Coagulation Profile: No results for input(s): INR, PROTIME in the last 168 hours. Cardiac Enzymes: No results for input(s): CKTOTAL, CKMB, CKMBINDEX, TROPONINI in the last 168 hours. BNP (last 3 results) No results for input(s): PROBNP in the last 8760 hours. HbA1C: Recent Labs    01/27/21 0153  HGBA1C 14.4*   CBG: Recent Labs  Lab 01/27/21 0605 01/27/21 1121 01/27/21 1619 01/27/21 2144 01/28/21 0601  GLUCAP 289* 145* 150* 207* 119*   Lipid Profile: No results for input(s): CHOL, HDL, LDLCALC, TRIG, CHOLHDL, LDLDIRECT in the last 72 hours. Thyroid Function Tests: No results for input(s): TSH, T4TOTAL, FREET4, T3FREE, THYROIDAB in the last 72 hours. Anemia Panel: No results for input(s): VITAMINB12, FOLATE, FERRITIN, TIBC, IRON, RETICCTPCT in the last 72 hours. Sepsis Labs: No results for input(s): PROCALCITON, LATICACIDVEN in the last 168 hours.  Recent Results (from the past 240 hour(s))  Resp Panel by RT-PCR (Flu A&B, Covid) Nasopharyngeal Swab     Status: None   Collection Time: 01/26/21  4:58 PM   Specimen: Nasopharyngeal Swab; Nasopharyngeal(NP) swabs in vial transport medium  Result Value Ref Range Status   SARS Coronavirus 2 by RT PCR NEGATIVE NEGATIVE Final    Comment:  (NOTE) SARS-CoV-2 target nucleic acids are NOT DETECTED.  The SARS-CoV-2 RNA is generally detectable in upper respiratory specimens during the acute phase of infection. The lowest concentration of SARS-CoV-2 viral copies this assay can detect is 138 copies/mL. A negative result does not preclude SARS-Cov-2 infection and should not be used as the sole basis for treatment or other patient management decisions. A negative result may occur with  improper specimen collection/handling, submission of specimen other than nasopharyngeal swab, presence of viral mutation(s) within the areas targeted by this assay, and inadequate number of viral copies(<138 copies/mL). A negative result must be combined with clinical observations, patient history, and epidemiological information. The expected result is Negative.  Fact Sheet for Patients:  BloggerCourse.com  Fact Sheet for Healthcare Providers:  SeriousBroker.it  This test is no t yet approved or cleared by the [place] FDA and  has been authorized for detection and/or diagnosis of SARS-CoV-2 by FDA under an Emergency Use Authorization (EUA). This EUA will remain  in effect (meaning this test can be used) for the duration of the COVID-19 declaration under Section 564(b)(1) of the Act, 21 U.S.C.section 360bbb-3(b)(1), unless the authorization is terminated  or revoked sooner.       Influenza A by PCR NEGATIVE NEGATIVE Final   Influenza B by PCR NEGATIVE NEGATIVE Final    Comment: (NOTE) The Xpert Xpress SARS-CoV-2/FLU/RSV plus assay is intended as an aid in the diagnosis of influenza from Nasopharyngeal swab specimens and should not be used as a sole basis for treatment. Nasal washings and aspirates are unacceptable for Xpert Xpress SARS-CoV-2/FLU/RSV testing.  Fact Sheet for Patients: BloggerCourse.com  Fact Sheet for Healthcare  Providers: SeriousBroker.it  This test is not yet approved or cleared by the [place] FDA and has been authorized for detection and/or diagnosis of SARS-CoV-2 by FDA under an Emergency Use Authorization (EUA). This EUA will remain in effect (meaning this test can be used) for the duration of the COVID-19 declaration under Section 564(b)(1) of the Act, 21 U.S.C. section 360bbb-3(b)(1), unless the authorization is terminated or revoked.  Performed at [facility], [address], [place], [place] [address]  Radiology Studies: DG Chest 2 View  Result Date: 01/26/2021 CLINICAL DATA:  Shortness of breath, leg and abdomen swelling for 1.5 weeks, chest pain, history of fluid on the lungs EXAM: CHEST - 2 VIEW COMPARISON:  06/04/2020 FINDINGS: Enlargement of cardiac silhouette with pulmonary vascular congestion. Mediastinal contours normal. Minimal RIGHT basilar atelectasis. Lungs otherwise clear. No infiltrate, pleural effusion, or pneumothorax. IMPRESSION: Enlargement of cardiac silhouette with pulmonary vascular congestion. Minimal RIGHT basilar atelectasis. Electronically Signed   By: Ulyses Southward M.D.   On: 01/26/2021 18:27   ECHOCARDIOGRAM COMPLETE  Result Date: 01/27/2021    ECHOCARDIOGRAM REPORT   Patient Name:   KAILEE DOBMEIER Date of Exam: 01/27/2021 Medical Rec #:  [id number]  Height:       70.0 in Accession #:    [id number] Weight:       347.0 lb Date of Birth:  1971-01-12  BSA:          2.638 m Patient Age:    49 years   BP:           151/127 mmHg Patient Gender: M          HR:           83 bpm. Exam Location:  Inpatient Procedure: 2D Echo Indications:    congestive heart failure  History:        Patient has prior history of Echocardiogram examinations.                 Signs/Symptoms:Edema; Risk Factors:Diabetes and Hypertension.  Sonographer:    Delcie Roch RDCS Referring Phys: [phone] JARED M GARDNER  Sonographer Comments: Patient is morbidly obese.  Image acquisition challenging due to patient body habitus. IMPRESSIONS  1. Left ventricular ejection fraction, by estimation, is <20%. The left ventricle has severely decreased function. The left ventricle demonstrates global hypokinesis. The left ventricular internal cavity size was moderately dilated. Left ventricular diastolic parameters are consistent with Grade II diastolic dysfunction (pseudonormalization). Elevated left atrial pressure.  2. Right ventricular systolic function is severely reduced. The right ventricular size is mildly enlarged. There is moderately elevated pulmonary artery systolic pressure.  3. Left atrial size was mildly dilated.  4. The mitral valve is normal in structure. Mild mitral valve regurgitation. No evidence of mitral stenosis.  5. Tricuspid valve regurgitation is mild to moderate.  6. The aortic valve is tricuspid. Aortic valve regurgitation is not visualized. Mild aortic valve sclerosis is present, with no evidence of aortic valve stenosis.  7. The inferior vena cava is dilated in size with <50% respiratory variability, suggesting right atrial pressure of 15 mmHg. FINDINGS  Left Ventricle: Left ventricular ejection fraction, by estimation, is <20%. The left ventricle has severely decreased function. The left ventricle demonstrates global hypokinesis. The left ventricular internal cavity size was moderately dilated. There is no left ventricular hypertrophy. Left ventricular diastolic parameters are consistent with Grade II diastolic dysfunction (pseudonormalization). Elevated left atrial pressure. Right Ventricle: The right ventricular size is mildly enlarged. Right ventricular systolic function is severely reduced. There is moderately elevated pulmonary artery systolic pressure. The tricuspid regurgitant velocity is 3.07 m/s, and with an assumed right atrial pressure of 15 mmHg, the estimated right ventricular systolic pressure is 52.7 mmHg. Left Atrium: Left atrial size was  mildly dilated. Right Atrium: Right atrial size was normal in size. Pericardium: There is no evidence of pericardial effusion. Mitral Valve: The mitral valve is normal in structure. Mild mitral annular calcification. Mild mitral valve regurgitation. No evidence of mitral  valve stenosis. Tricuspid Valve: The tricuspid valve is normal in structure. Tricuspid valve regurgitation is mild to moderate. No evidence of tricuspid stenosis. Aortic Valve: The aortic valve is tricuspid. Aortic valve regurgitation is not visualized. Mild aortic valve sclerosis is present, with no evidence of aortic valve stenosis. Pulmonic Valve: The pulmonic valve was normal in structure. Pulmonic valve regurgitation is mild. No evidence of pulmonic stenosis. Aorta: The aortic root is normal in size and structure. Venous: The inferior vena cava is dilated in size with less than 50% respiratory variability, suggesting right atrial pressure of 15 mmHg. IAS/Shunts: No atrial level shunt detected by color flow Doppler.  LEFT VENTRICLE PLAX 2D LVIDd:         5.90 cm      Diastology LVIDs:         5.50 cm      LV e' medial:    4.90 cm/s LV PW:         1.00 cm      LV E/e' medial:  17.4 LV IVS:        1.00 cm      LV e' lateral:   5.11 cm/s LVOT diam:     2.40 cm      LV E/e' lateral: 16.7 LV SV:         48 LV SV Index:   18 LVOT Area:     4.52 cm  LV Volumes (MOD) LV vol d, MOD A4C: 112.0 ml LV vol s, MOD A4C: 95.2 ml LV SV MOD A4C:     112.0 ml RIGHT VENTRICLE             IVC RV S prime:     11.90 cm/s  IVC diam: 2.30 cm TAPSE (M-mode): 1.4 cm LEFT ATRIUM             Index        RIGHT ATRIUM           Index LA diam:        4.60 cm 1.74 cm/m   RA Area:     16.70 cm LA Vol (A2C):   94.8 ml 35.93 ml/m  RA Volume:   43.60 ml  16.53 ml/m LA Vol (A4C):   83.0 ml 31.46 ml/m LA Biplane Vol: 91.7 ml 34.76 ml/m  AORTIC VALVE LVOT Vmax:   61.60 cm/s LVOT Vmean:  38.800 cm/s LVOT VTI:    0.107 m  AORTA Ao Root diam: 3.20 cm Ao Asc diam:  3.20 cm MITRAL  VALVE               TRICUSPID VALVE MV Area (PHT): 4.60 cm    TR Peak grad:   37.7 mmHg MV Decel Time: 165 msec    TR Vmax:        307.00 cm/s MV E velocity: 85.40 cm/s MV A velocity: 49.00 cm/s  SHUNTS MV E/A ratio:  1.74        Systemic VTI:  0.11 m                            Systemic Diam: 2.40 cm Olga Millers MD Electronically signed by Olga Millers MD Signature Date/Time: 01/27/2021/12:56:29 PM    Final      LOS: 1 day   Lanae Boast, MD [facility]  01/28/2021, 10:04 AM

## 2021-01-28 NOTE — Plan of Care (Signed)

## 2021-01-28 NOTE — Progress Notes (Signed)
Subjective:  Patient denies any chest pain or shortness of breath, leg swelling, markedly improved.  Diuresing well.  States discussed briefly with his cardiologist in [place] regarding ICD in the past.  Objective:  Vital Signs in the last 24 hours: Temp:  [98.3 F (36.8 C)-98.8 F (37.1 C)] 98.5 F (36.9 C) (10/14 0419) Pulse Rate:  [72-93] 93 (10/14 0910) Resp:  [19-20] 20 (10/14 0419) BP: (125-158)/(89-112) 148/101 (10/14 0910) SpO2:  [95 %-100 %] 95 % (10/14 0419) Weight:  [148 kg] 148 kg (10/14 0419)  Intake/Output from previous day: 10/13 0701 - 10/14 0700 In: 834 [P.O.:834] Out: 5525 [Urine:5525] Intake/Output from this shift: No intake/output data recorded.  Physical Exam: Neck: no adenopathy, no carotid bruit, no JVD, and supple, symmetrical, trachea midline Lungs: clear to auscultation bilaterally Heart: regular rate and rhythm, S1, S2 normal, and soft systolic murmur noted Abdomen: soft, non-tender; bowel sounds normal; no masses,  no organomegaly Extremities: no clubbing, cyanosis 3+ edema noted  Lab Results: Recent Labs    01/26/21 1655 01/27/21 0153  WBC 7.3 9.0  HGB 15.8 16.4  PLT 218 215   Recent Labs    01/27/21 0153 01/28/21 0342  NA 136 138  K 3.5 2.7*  CL 98 99  CO2 27 27  GLUCOSE 338* 118*  BUN 10 11  CREATININE 1.20 1.23   No results for input(s): TROPONINI in the last 72 hours.  Invalid input(s): CK, MB Hepatic Function Panel Recent Labs    01/26/21 1655  PROT 6.8  ALBUMIN 3.1*  AST 32  ALT 38  ALKPHOS 89  BILITOT 1.7*   No results for input(s): CHOL in the last 72 hours. No results for input(s): PROTIME in the last 72 hours.  Imaging: DG Chest 2 View  Result Date: 01/26/2021 CLINICAL DATA:  Shortness of breath, leg and abdomen swelling for 1.5 weeks, chest pain, history of fluid on the lungs EXAM: CHEST - 2 VIEW COMPARISON:  06/04/2020 FINDINGS: Enlargement of cardiac silhouette with pulmonary vascular congestion.  Mediastinal contours normal. Minimal RIGHT basilar atelectasis. Lungs otherwise clear. No infiltrate, pleural effusion, or pneumothorax. IMPRESSION: Enlargement of cardiac silhouette with pulmonary vascular congestion. Minimal RIGHT basilar atelectasis. Electronically Signed   By: Ulyses Southward M.D.   On: 01/26/2021 18:27   ECHOCARDIOGRAM COMPLETE  Result Date: 01/27/2021    ECHOCARDIOGRAM REPORT   Patient Name:   Terry Young Date of Exam: 01/27/2021 Medical Rec #:  [id number]  Height:       70.0 in Accession #:    [id number] Weight:       347.0 lb Date of Birth:  Jun 25, 1970  BSA:          2.638 m Patient Age:    49 years   BP:           151/127 mmHg Patient Gender: M          HR:           83 bpm. Exam Location:  Inpatient Procedure: 2D Echo Indications:    congestive heart failure  History:        Patient has prior history of Echocardiogram examinations.                 Signs/Symptoms:Edema; Risk Factors:Diabetes and Hypertension.  Sonographer:    Delcie Roch RDCS Referring Phys: [phone] JARED M GARDNER  Sonographer Comments: Patient is morbidly obese. Image acquisition challenging due to patient body habitus. IMPRESSIONS  1. Left ventricular ejection fraction, by estimation, is <  20%. The left ventricle has severely decreased function. The left ventricle demonstrates global hypokinesis. The left ventricular internal cavity size was moderately dilated. Left ventricular diastolic parameters are consistent with Grade II diastolic dysfunction (pseudonormalization). Elevated left atrial pressure.  2. Right ventricular systolic function is severely reduced. The right ventricular size is mildly enlarged. There is moderately elevated pulmonary artery systolic pressure.  3. Left atrial size was mildly dilated.  4. The mitral valve is normal in structure. Mild mitral valve regurgitation. No evidence of mitral stenosis.  5. Tricuspid valve regurgitation is mild to moderate.  6. The aortic valve is tricuspid. Aortic valve  regurgitation is not visualized. Mild aortic valve sclerosis is present, with no evidence of aortic valve stenosis.  7. The inferior vena cava is dilated in size with <50% respiratory variability, suggesting right atrial pressure of 15 mmHg. FINDINGS  Left Ventricle: Left ventricular ejection fraction, by estimation, is <20%. The left ventricle has severely decreased function. The left ventricle demonstrates global hypokinesis. The left ventricular internal cavity size was moderately dilated. There is no left ventricular hypertrophy. Left ventricular diastolic parameters are consistent with Grade II diastolic dysfunction (pseudonormalization). Elevated left atrial pressure. Right Ventricle: The right ventricular size is mildly enlarged. Right ventricular systolic function is severely reduced. There is moderately elevated pulmonary artery systolic pressure. The tricuspid regurgitant velocity is 3.07 m/s, and with an assumed right atrial pressure of 15 mmHg, the estimated right ventricular systolic pressure is 52.7 mmHg. Left Atrium: Left atrial size was mildly dilated. Right Atrium: Right atrial size was normal in size. Pericardium: There is no evidence of pericardial effusion. Mitral Valve: The mitral valve is normal in structure. Mild mitral annular calcification. Mild mitral valve regurgitation. No evidence of mitral valve stenosis. Tricuspid Valve: The tricuspid valve is normal in structure. Tricuspid valve regurgitation is mild to moderate. No evidence of tricuspid stenosis. Aortic Valve: The aortic valve is tricuspid. Aortic valve regurgitation is not visualized. Mild aortic valve sclerosis is present, with no evidence of aortic valve stenosis. Pulmonic Valve: The pulmonic valve was normal in structure. Pulmonic valve regurgitation is mild. No evidence of pulmonic stenosis. Aorta: The aortic root is normal in size and structure. Venous: The inferior vena cava is dilated in size with less than 50% respiratory  variability, suggesting right atrial pressure of 15 mmHg. IAS/Shunts: No atrial level shunt detected by color flow Doppler.  LEFT VENTRICLE PLAX 2D LVIDd:         5.90 cm      Diastology LVIDs:         5.50 cm      LV e' medial:    4.90 cm/s LV PW:         1.00 cm      LV E/e' medial:  17.4 LV IVS:        1.00 cm      LV e' lateral:   5.11 cm/s LVOT diam:     2.40 cm      LV E/e' lateral: 16.7 LV SV:         48 LV SV Index:   18 LVOT Area:     4.52 cm  LV Volumes (MOD) LV vol d, MOD A4C: 112.0 ml LV vol s, MOD A4C: 95.2 ml LV SV MOD A4C:     112.0 ml RIGHT VENTRICLE             IVC RV S prime:     11.90 cm/s  IVC diam: 2.30 cm TAPSE (  M-mode): 1.4 cm LEFT ATRIUM             Index        RIGHT ATRIUM           Index LA diam:        4.60 cm 1.74 cm/m   RA Area:     16.70 cm LA Vol (A2C):   94.8 ml 35.93 ml/m  RA Volume:   43.60 ml  16.53 ml/m LA Vol (A4C):   83.0 ml 31.46 ml/m LA Biplane Vol: 91.7 ml 34.76 ml/m  AORTIC VALVE LVOT Vmax:   61.60 cm/s LVOT Vmean:  38.800 cm/s LVOT VTI:    0.107 m  AORTA Ao Root diam: 3.20 cm Ao Asc diam:  3.20 cm MITRAL VALVE               TRICUSPID VALVE MV Area (PHT): 4.60 cm    TR Peak grad:   37.7 mmHg MV Decel Time: 165 msec    TR Vmax:        307.00 cm/s MV E velocity: 85.40 cm/s MV A velocity: 49.00 cm/s  SHUNTS MV E/A ratio:  1.74        Systemic VTI:  0.11 m                            Systemic Diam: 2.40 cm Olga Millers MD Electronically signed by Olga Millers MD Signature Date/Time: 01/27/2021/12:56:29 PM    Final     Cardiac Studies:  Assessment/Plan:  Acute on chronic decompensated systolic congestive heart failure secondary to noncompliance to medication and diet 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 We will up titrate beta blockers and interest to as outpatient as blood pressure and heart rate tolerates. Discussed with patient regarding repeating 2-D echo in 3 months as outpatient and if EF remains below 35%, will refer to EP for evaluation of ICD. Replace K Dr. Algie Coffer on call for me for the weekend  LOS: 1 day    Rinaldo Cloud 01/28/2021, 9:29 AM

## 2021-01-29 DIAGNOSIS — E876 Hypokalemia: Secondary | ICD-10-CM

## 2021-01-29 DIAGNOSIS — I248 Other forms of acute ischemic heart disease: Secondary | ICD-10-CM

## 2021-01-29 DIAGNOSIS — E66812 Obesity, class 2: Secondary | ICD-10-CM

## 2021-01-29 DIAGNOSIS — I5043 Acute on chronic combined systolic (congestive) and diastolic (congestive) heart failure: Secondary | ICD-10-CM

## 2021-01-29 LAB — GLUCOSE, CAPILLARY
Glucose-Capillary: 115 mg/dL — ABNORMAL HIGH (ref 70–99)
Glucose-Capillary: 204 mg/dL — ABNORMAL HIGH (ref 70–99)
Glucose-Capillary: 218 mg/dL — ABNORMAL HIGH (ref 70–99)
Glucose-Capillary: 223 mg/dL — ABNORMAL HIGH (ref 70–99)

## 2021-01-29 LAB — BASIC METABOLIC PANEL
Anion gap: 13 (ref 5–15)
BUN: 13 mg/dL (ref 6–20)
CO2: 27 mmol/L (ref 22–32)
Calcium: 8.8 mg/dL — ABNORMAL LOW (ref 8.9–10.3)
Chloride: 99 mmol/L (ref 98–111)
Creatinine, Ser: 1.27 mg/dL — ABNORMAL HIGH (ref 0.61–1.24)
GFR, Estimated: >60 mL/min (ref >60)
Glucose, Bld: 126 mg/dL — ABNORMAL HIGH (ref 70–99)
Potassium: 3.3 mmol/L — ABNORMAL LOW (ref 3.5–5.1)
Sodium: 139 mmol/L (ref 135–145)

## 2021-01-29 MED ORDER — FUROSEMIDE 10 MG/ML IJ SOLN
40.0000 mg | Freq: Every day | INTRAMUSCULAR | Status: DC
  Administered 2021-01-30 – 2021-01-31 (×2): 40 mg via INTRAVENOUS
  Filled 2021-01-29 (×2): qty 4

## 2021-01-29 MED ORDER — POTASSIUM CHLORIDE CRYS ER 20 MEQ PO TBCR
20.0000 meq | EXTENDED_RELEASE_TABLET | Freq: Two times a day (BID) | ORAL | Status: DC
  Administered 2021-01-29 – 2021-01-30 (×3): 20 meq via ORAL
  Filled 2021-01-29: qty 2
  Filled 2021-01-29: qty 1

## 2021-01-29 NOTE — Plan of Care (Signed)

## 2021-01-29 NOTE — Assessment & Plan Note (Addendum)
-   Echo repeated on 01/27/2021 - EF less than 20%, grade 2 diastolic dysfunction.  LV global hypokinesis -Cardiology following, appreciate assistance - Patient started on GDMT: Lasix, Coreg, spironolactone, Jardiance, Entresto -Diuresing well; was net negative 16.5 L at time of discharge  -Repeat echo in approximately 3 months.  If EF still less than 30 to 35%, likely will warrant consideration for ICD - check BMP at follow up especially potassium and renal function

## 2021-01-29 NOTE — Assessment & Plan Note (Signed)
-   Elevated troponins considered due to demand ischemia in setting of underlying severe systolic CHF - continue GDMT

## 2021-01-29 NOTE — Hospital Course (Signed)
Terry Young is a 50 year old male with systolic CHF/and ICM with LVEF <20%, T2DM, HTN, nonadherent to medical therapy who ran out of his medication a months ago and presented to the ED with shortness of breath, lower leg edema, abdominal edema and SOB.   In the ED BNP 714, chest x-ray enlarged cardiac silhouette with vascular congestion, hypokalemia 3.2 troponin 39> 43, hyperglycemia 315.  Patient was found to be in acute systolic CHF exacerbation and admitted for further management. Cardiology was consulted and he was started on GDMT.

## 2021-01-29 NOTE — Assessment & Plan Note (Addendum)
-   A1c 14.4% on 01/27/2021 -Continue glargine at discharge; 30 units daily - may require SSI still at follow up; review glucose logs if able - started gabapentin for neuropathy pains

## 2021-01-29 NOTE — Assessment & Plan Note (Signed)
-   Complicates overall prognosis and care - Body mass index is 44.75 kg/m. - Weight Loss and Dietary Counseling given

## 2021-01-29 NOTE — Consult Note (Signed)
Ref: Pcp, No   Subjective:  Awake. No chest pain. He had good diuresis over 2 days. Lost about 5-10 pounds of weight.  Patient aware of severe LV systolic dysfunction. 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 Hypokalemia continues.  Objective:  Vital Signs in the last 24 hours: Temp:  [98.1 F (36.7 C)-98.3 F (36.8 C)] 98.3 F (36.8 C) (10/15 0824) Pulse Rate:  [89-95] 95 (10/15 0824) Cardiac Rhythm: Normal sinus rhythm;Bundle branch block (10/15 0845) Resp:  [14-17] 14 (10/15 0635) BP: (132-137)/(91-96) 137/96 (10/15 0824) SpO2:  [96 %-99 %] 96 % (10/15 0824) Weight:  [141.5 kg] 141.5 kg (10/15 0932)  Physical Exam: BP Readings from Last 1 Encounters:  01/29/21 (!) 137/96     Wt Readings from Last 1 Encounters:  01/29/21 (!) 141.5 kg    Weight change: -6.532 kg Body mass index is 44.75 kg/m. HEENT: [place]/AT, Eyes-Brown, Conjunctiva-Pink, Sclera-Non-icteric Neck: No JVD, No bruit, Trachea midline. Lungs:  Clear, Bilateral. Cardiac:  Regular rhythm, normal S1 and S2, no S3. II/VI systolic murmur. Abdomen:  Soft, non-tender. BS present. Extremities:  1 + edema present. No cyanosis. No clubbing. Dressing applied over both lower legs. CNS: AxOx3, Cranial nerves grossly intact, moves all 4 extremities.  Skin: Warm and dry.   Intake/Output from previous day: 10/14 0701 - 10/15 0700 In: 483 [P.O.:480] Out: 3200 [Urine:3200]    Lab Results: BMET    Component Value Date/Time   NA 139 01/29/2021 0223   NA 138 01/28/2021 0342   NA 136 01/27/2021 0153   K 3.3 (L) 01/29/2021 0223   K 3.3 (L) 01/28/2021 1757   K 2.7 (LL) 01/28/2021 0342   CL 99 01/29/2021 0223   CL 99 01/28/2021 0342   CL 98 01/27/2021 0153   CO2 27 01/29/2021 0223   CO2 27 01/28/2021 0342   CO2 27 01/27/2021 0153   GLUCOSE 126 (H) 01/29/2021 0223   GLUCOSE 118 (H) 01/28/2021 0342   GLUCOSE 338 (H)  01/27/2021 0153   BUN 13 01/29/2021 0223   BUN 11 01/28/2021 0342   BUN 10 01/27/2021 0153   CREATININE 1.27 (H) 01/29/2021 0223   CREATININE 1.23 01/28/2021 0342   CREATININE 1.20 01/27/2021 0153   CALCIUM 8.8 (L) 01/29/2021 0223   CALCIUM 8.8 (L) 01/28/2021 0342   CALCIUM 9.0 01/27/2021 0153   GFRNONAA >60 01/29/2021 0223   GFRNONAA >60 01/28/2021 0342   GFRNONAA >60 01/27/2021 0153   CBC    Component Value Date/Time   WBC 9.0 01/27/2021 0153   RBC 5.53 01/27/2021 0153   HGB 16.4 01/27/2021 0153   HCT 49.8 01/27/2021 0153   PLT 215 01/27/2021 0153   MCV 90.1 01/27/2021 0153   MCH 29.7 01/27/2021 0153   MCHC 32.9 01/27/2021 0153   RDW 13.2 01/27/2021 0153   LYMPHSABS 1.5 01/26/2021 1655   MONOABS 0.6 01/26/2021 1655   EOSABS 0.1 01/26/2021 1655   BASOSABS 0.1 01/26/2021 1655   HEPATIC Function Panel Recent Labs    06/04/20 1308 01/26/21 1655  PROT 7.8 6.8   HEMOGLOBIN A1C No components found for: HGA1C,  MPG CARDIAC ENZYMES No results found for: CKTOTAL, CKMB, CKMBINDEX, TROPONINI BNP No results for input(s): PROBNP in the last 8760 hours. TSH No results for input(s): TSH in the last 8760 hours. CHOLESTEROL No results for input(s): CHOL in the last 8760 hours.  Scheduled Meds:  (  feeding supplement) PROSource Plus  30 mL Oral TID BM   aspirin EC  81 mg Oral Daily   atorvastatin  40 mg Oral Daily   carvedilol  6.25 mg Oral BID WC   empagliflozin  10 mg Oral Daily   enoxaparin (LOVENOX) injection  75 mg Subcutaneous QHS   furosemide  40 mg Intravenous BID   insulin aspart  0-15 Units Subcutaneous TID WC   insulin glargine-yfgn  20 Units Subcutaneous QHS   multivitamin with minerals  1 tablet Oral Daily   mupirocin ointment   Topical Daily   potassium chloride  20 mEq Oral BID   sacubitril-valsartan  1 tablet Oral BID   spironolactone  25 mg Oral Daily   Continuous Infusions:  PRN Meds:.acetaminophen **OR** acetaminophen, labetalol, nystatin,  ondansetron **OR** ondansetron (ZOFRAN) IV  Assessment/Plan:   Acute on chronic systolic Bi-ventricular failure, stable  Hypertension, improving  Type 2 DM with hyperglycemia, improving control  Abnormal Troponin I from demand ischemia  Morbid obesity  Hypokalemia  HLD   Plan:  Continue Aspirin, atorvastatin, Carvedilol, furosemide, Entresto, spironolactone and potassium.  Increase activity.  Decrease Lasix to once daily and convert to PO torsemide 20 mg. From tomorrow or Monday.  Increase Potassium to BID.   LOS: 2 days   Time spent including chart review, lab review, examination, discussion with patient/Nurse : 30 min   Orpah Cobb  MD  01/29/2021, 9:59 AM

## 2021-01-29 NOTE — Assessment & Plan Note (Signed)
-   Continue potassium supplementation - Frequency increased per cardiology

## 2021-01-29 NOTE — Progress Notes (Signed)
Progress Note    Terry Young   ZYS:[id number]  DOB: 1970/07/26  DOA: 01/26/2021     2 Date of Service: 01/29/2021   Clinical Course Terry Young is a 50 year old male with systolic CHF/and ICM with LVEF <20%, T2DM, HTN, nonadherent to medical therapy who ran out of his medication a months ago and presented to the ED with shortness of breath, lower leg edema, abdominal edema and SOB.   In the ED BNP 714, chest x-ray enlarged cardiac silhouette with vascular congestion, hypokalemia 3.2 troponin 39> 43, hyperglycemia 315.  Patient was found to be in acute systolic CHF exacerbation and admitted for further management. Cardiology was consulted and he was started on GDMT.  Assessment and Plan * Acute on chronic combined systolic and diastolic CHF (congestive heart failure) (HCC) - Echo repeated on 01/27/2021 - EF less than 20%, grade 2 diastolic dysfunction.  LV global hypokinesis -Cardiology following, appreciate assistance - Patient started on GDMT: Lasix, Coreg, spironolactone, Jardiance, Entresto -Diuresing well - Continue monitoring output -Repeat echo in approximately 3 months.  If EF still less than 30 to 35%, likely will warrant consideration for ICD  Hypokalemia - Continue potassium supplementation - Frequency increased per cardiology  Demand ischemia (HCC) - Elevated troponins considered due to demand ischemia in setting of underlying severe systolic CHF - continue GDMT  Obesity, Class III, BMI 40-49.9 (morbid obesity) (HCC) - Complicates overall prognosis and care - Body mass index is 44.75 kg/m. - Weight Loss and Dietary Counseling given  HTN (hypertension) - See CHF - Continue current regimen  DM2 (diabetes mellitus, type 2) (HCC) - A1c 14.4% on 01/27/2021 - Continue SSI and CBG monitoring -Continue Semglee     Subjective:  No events overnight.  Sitting on edge of bed resting comfortably.  Breathing has improved since admission as well as swelling  throughout.  Objective Vitals:   01/28/21 2214 01/29/21 0635 01/29/21 0824 01/29/21 1215  BP: (!) 132/91 (!) 136/96 (!) 137/96 (!) 132/96  Pulse: 89 94 95 93  Resp: 17 14    Temp: 98.3 F (36.8 C) 98.1 F (36.7 C) 98.3 F (36.8 C)   TempSrc: Oral Oral Oral   SpO2: 99% 98% 96% 95%  Weight:  (!) 141.5 kg    Height:       (!) 141.5 kg  Vital signs were reviewed and unremarkable.   Exam Physical Exam Constitutional:      General: He is not in acute distress.    Appearance: He is well-developed. He is obese. He is not ill-appearing.  HENT:     Head: Normocephalic and atraumatic.  Eyes:     Extraocular Movements: Extraocular movements intact.  Cardiovascular:     Rate and Rhythm: Normal rate and regular rhythm.  Pulmonary:     Effort: Pulmonary effort is normal.     Breath sounds: Normal breath sounds.  Abdominal:     General: Bowel sounds are normal.     Palpations: Abdomen is soft.     Comments: Obese; wall edema appreciated  Musculoskeletal:        General: Normal range of motion.     Cervical back: Normal range of motion and neck supple.     Comments: 2+ LE edema B/L  Skin:    General: Skin is warm and dry.  Neurological:     General: No focal deficit present.     Mental Status: He is alert.  Psychiatric:        Mood and Affect: Mood  normal.        Behavior: Behavior normal.     Labs / Other Information My review of labs, imaging, notes and other tests shows no new significant findings.    Disposition Plan: Status is: Inpatient  Remains inpatient appropriate because: Ongoing IV diuresis  Time spent: Greater than 50% of the 35 minute visit was spent in counseling/coordination of care for the patient as laid out in the A&P.  Lewie Chamber, MD [facility] 01/29/2021, 12:41 PM

## 2021-01-29 NOTE — Assessment & Plan Note (Signed)
-   See CHF - Continue current regimen

## 2021-01-30 LAB — BASIC METABOLIC PANEL
Anion gap: 10 (ref 5–15)
BUN: 15 mg/dL (ref 6–20)
CO2: 29 mmol/L (ref 22–32)
Calcium: 8.6 mg/dL — ABNORMAL LOW (ref 8.9–10.3)
Chloride: 101 mmol/L (ref 98–111)
Creatinine, Ser: 1.32 mg/dL — ABNORMAL HIGH (ref 0.61–1.24)
GFR, Estimated: >60 mL/min (ref >60)
Glucose, Bld: 146 mg/dL — ABNORMAL HIGH (ref 70–99)
Potassium: 3.4 mmol/L — ABNORMAL LOW (ref 3.5–5.1)
Sodium: 140 mmol/L (ref 135–145)

## 2021-01-30 LAB — GLUCOSE, CAPILLARY
Glucose-Capillary: 131 mg/dL — ABNORMAL HIGH (ref 70–99)
Glucose-Capillary: 120 mg/dL — ABNORMAL HIGH (ref 70–99)
Glucose-Capillary: 191 mg/dL — ABNORMAL HIGH (ref 70–99)
Glucose-Capillary: 213 mg/dL — ABNORMAL HIGH (ref 70–99)

## 2021-01-30 LAB — MAGNESIUM: Magnesium: 1.6 mg/dL — ABNORMAL LOW (ref 1.7–2.4)

## 2021-01-30 MED ORDER — MAGNESIUM OXIDE -MG SUPPLEMENT 400 (240 MG) MG PO TABS
800.0000 mg | ORAL_TABLET | Freq: Once | ORAL | Status: AC
  Administered 2021-01-30: 800 mg via ORAL
  Filled 2021-01-30: qty 2

## 2021-01-30 MED ORDER — GABAPENTIN 300 MG PO CAPS
300.0000 mg | ORAL_CAPSULE | Freq: Three times a day (TID) | ORAL | Status: DC
  Administered 2021-01-30 – 2021-01-31 (×5): 300 mg via ORAL
  Filled 2021-01-30 (×5): qty 1

## 2021-01-30 MED ORDER — MAGNESIUM SULFATE IN D5W 1-5 GM/100ML-% IV SOLN
1.0000 g | Freq: Once | INTRAVENOUS | Status: AC
  Administered 2021-01-30: 1 g via INTRAVENOUS
  Filled 2021-01-30: qty 100

## 2021-01-30 MED ORDER — POTASSIUM CHLORIDE CRYS ER 20 MEQ PO TBCR
20.0000 meq | EXTENDED_RELEASE_TABLET | Freq: Three times a day (TID) | ORAL | Status: DC
  Administered 2021-01-30 – 2021-01-31 (×4): 20 meq via ORAL
  Filled 2021-01-30 (×4): qty 1

## 2021-01-30 NOTE — Consult Note (Signed)
Ref: Pcp, No   Subjective:  Awake. One more day of good diuresis yesterday. Potassium 3.4 meq.  Objective:  Vital Signs in the last 24 hours: Temp:  [98.4 F (36.9 C)-98.8 F (37.1 C)] 98.4 F (36.9 C) (10/16 0738) Pulse Rate:  [87-94] 87 (10/16 1140) Cardiac Rhythm: Normal sinus rhythm;Bundle branch block (10/16 0702) Resp:  [16-17] 16 (10/16 0500) BP: (121-142)/(78-113) 137/112 (10/16 1140) SpO2:  [97 %-100 %] 100 % (10/16 1140) Weight:  [138.8 kg] 138.8 kg (10/16 0500)  Physical Exam: BP Readings from Last 1 Encounters:  01/30/21 (!) 137/112     Wt Readings from Last 1 Encounters:  01/30/21 (!) 138.8 kg    Weight change: -2.722 kg Body mass index is 43.89 kg/m. HEENT: [place]/AT, Eyes-Brown, Conjunctiva-Pink, Sclera-Non-icteric Neck: No JVD, No bruit, Trachea midline. Lungs:  Clear, Bilateral. Cardiac:  Regular rhythm, normal S1 and S2, no S3. II/VI systolic murmur. Abdomen:  Soft, non-tender. BS present. Extremities:  1 + R > L lower leg edema present. No cyanosis. No clubbing. CNS: AxOx3, Cranial nerves grossly intact, moves all 4 extremities.  Skin: Warm and dry.   Intake/Output from previous day: 10/15 0701 - 10/16 0700 In: 840 [P.O.:840] Out: 4650 [Urine:4650]    Lab Results: BMET    Component Value Date/Time   NA 140 01/30/2021 0347   NA 139 01/29/2021 0223   NA 138 01/28/2021 0342   K 3.4 (L) 01/30/2021 0347   K 3.3 (L) 01/29/2021 0223   K 3.3 (L) 01/28/2021 1757   CL 101 01/30/2021 0347   CL 99 01/29/2021 0223   CL 99 01/28/2021 0342   CO2 29 01/30/2021 0347   CO2 27 01/29/2021 0223   CO2 27 01/28/2021 0342   GLUCOSE 146 (H) 01/30/2021 0347   GLUCOSE 126 (H) 01/29/2021 0223   GLUCOSE 118 (H) 01/28/2021 0342   BUN 15 01/30/2021 0347   BUN 13 01/29/2021 0223   BUN 11 01/28/2021 0342   CREATININE 1.32 (H) 01/30/2021 0347   CREATININE 1.27 (H) 01/29/2021 0223   CREATININE 1.23 01/28/2021 0342   CALCIUM 8.6 (L) 01/30/2021 0347   CALCIUM 8.8 (L)  01/29/2021 0223   CALCIUM 8.8 (L) 01/28/2021 0342   GFRNONAA >60 01/30/2021 0347   GFRNONAA >60 01/29/2021 0223   GFRNONAA >60 01/28/2021 0342   CBC    Component Value Date/Time   WBC 9.0 01/27/2021 0153   RBC 5.53 01/27/2021 0153   HGB 16.4 01/27/2021 0153   HCT 49.8 01/27/2021 0153   PLT 215 01/27/2021 0153   MCV 90.1 01/27/2021 0153   MCH 29.7 01/27/2021 0153   MCHC 32.9 01/27/2021 0153   RDW 13.2 01/27/2021 0153   LYMPHSABS 1.5 01/26/2021 1655   MONOABS 0.6 01/26/2021 1655   EOSABS 0.1 01/26/2021 1655   BASOSABS 0.1 01/26/2021 1655   HEPATIC Function Panel Recent Labs    06/04/20 1308 01/26/21 1655  PROT 7.8 6.8   HEMOGLOBIN A1C No components found for: HGA1C,  MPG CARDIAC ENZYMES No results found for: CKTOTAL, CKMB, CKMBINDEX, TROPONINI BNP No results for input(s): PROBNP in the last 8760 hours. TSH No results for input(s): TSH in the last 8760 hours. CHOLESTEROL No results for input(s): CHOL in the last 8760 hours.  Scheduled Meds:  (feeding supplement) PROSource Plus  30 mL Oral TID BM   aspirin EC  81 mg Oral Daily   atorvastatin  40 mg Oral Daily   carvedilol  6.25 mg Oral BID WC   empagliflozin  10  mg Oral Daily   enoxaparin (LOVENOX) injection  75 mg Subcutaneous QHS   furosemide  40 mg Intravenous Daily   gabapentin  300 mg Oral TID   insulin aspart  0-15 Units Subcutaneous TID WC   insulin glargine-yfgn  20 Units Subcutaneous QHS   multivitamin with minerals  1 tablet Oral Daily   mupirocin ointment   Topical Daily   potassium chloride  20 mEq Oral BID   sacubitril-valsartan  1 tablet Oral BID   spironolactone  25 mg Oral Daily   Continuous Infusions:  PRN Meds:.acetaminophen **OR** acetaminophen, labetalol, nystatin, ondansetron **OR** ondansetron (ZOFRAN) IV  Assessment/Plan:  Acute on chronic Bi-Ventricular failure, stable HTN Type 2 DM Abnormal troponin I from demand ischemia Morbid obesity Hypokalemia HLD  Plan: Continue  medications. Increase potassium supplementation. Increase activity.   LOS: 3 days   Time spent including chart review, lab review, examination, discussion with patient : 30 min   Terry Cobb  MD  01/30/2021, 3:22 PM

## 2021-01-30 NOTE — Progress Notes (Signed)
Progress Note    Terry Young   EUM:[id number]  DOB: 07-06-1970  DOA: 01/26/2021     3 Date of Service: 01/30/2021   Clinical Course Terry Young is a 50 year old male with systolic CHF/and ICM with LVEF <20%, T2DM, HTN, nonadherent to medical therapy who ran out of his medication a months ago and presented to the ED with shortness of breath, lower leg edema, abdominal edema and SOB.   In the ED BNP 714, chest x-ray enlarged cardiac silhouette with vascular congestion, hypokalemia 3.2 troponin 39> 43, hyperglycemia 315.  Patient was found to be in acute systolic CHF exacerbation and admitted for further management. Cardiology was consulted and he was started on GDMT.   Assessment and Plan * Acute on chronic combined systolic and diastolic CHF (congestive heart failure) (HCC) - Echo repeated on 01/27/2021 - EF less than 20%, grade 2 diastolic dysfunction.  LV global hypokinesis -Cardiology following, appreciate assistance - Patient started on GDMT: Lasix, Coreg, spironolactone, Jardiance, Entresto -Diuresing well - Continue monitoring output -Repeat echo in approximately 3 months.  If EF still less than 30 to 35%, likely will warrant consideration for ICD  Hypokalemia - Continue potassium supplementation - Frequency increased per cardiology  Demand ischemia (HCC) - Elevated troponins considered due to demand ischemia in setting of underlying severe systolic CHF - continue GDMT  Obesity, Class III, BMI 40-49.9 (morbid obesity) (HCC) - Complicates overall prognosis and care - Body mass index is 44.75 kg/m. - Weight Loss and Dietary Counseling given  HTN (hypertension) - See CHF - Continue current regimen  DM2 (diabetes mellitus, type 2) (HCC) - A1c 14.4% on 01/27/2021 - Continue SSI and CBG monitoring -Continue Semglee - started gabapentin for neuropathy pains     Subjective:  No events overnight.  Still breathing comfortably and has been ambulating well.  Denies any  chest pain or shortness of breath.  Swelling has still continued to improve.  Objective Vitals:   01/30/21 0500 01/30/21 0738 01/30/21 1139 01/30/21 1140  BP: 121/78 (!) 142/98 (!) 135/113 (!) 137/112  Pulse: 94 94 88 87  Resp: 16     Temp: 98.8 F (37.1 C) 98.4 F (36.9 C)    TempSrc: Oral Oral    SpO2: 98% 98% 100% 100%  Weight: (!) 138.8 kg     Height:       (!) 138.8 kg  Vital signs were reviewed and unremarkable.   Exam Physical Exam Constitutional:      Appearance: He is well-developed.  HENT:     Head: Normocephalic and atraumatic.  Eyes:     Extraocular Movements: Extraocular movements intact.  Cardiovascular:     Rate and Rhythm: Normal rate and regular rhythm.  Pulmonary:     Effort: Pulmonary effort is normal.     Breath sounds: Normal breath sounds.  Chest:     Chest wall: No tenderness.  Abdominal:     General: Bowel sounds are normal.     Palpations: Abdomen is soft.     Tenderness: There is no abdominal tenderness.  Musculoskeletal:        General: Normal range of motion.     Cervical back: Normal range of motion and neck supple.     Right lower leg: Edema present.     Left lower leg: Edema present.  Skin:    General: Skin is warm and dry.  Neurological:     General: No focal deficit present.     Mental Status: He is alert.  Psychiatric:        Mood and Affect: Mood normal.        Behavior: Behavior normal.     Labs / Other Information My review of labs, imaging, notes and other tests shows no new significant findings.    Disposition Plan: Status is: Inpatient  Remains inpatient appropriate because: Ongoing diuresis     Time spent: Greater than 50% of the 35 minute visit was spent in counseling/coordination of care for the patient as laid out in the A&P.  Terry Chamber, MD [facility] 01/30/2021, 12:20 PM

## 2021-01-30 NOTE — Plan of Care (Signed)

## 2021-01-31 ENCOUNTER — Other Ambulatory Visit (HOSPITAL_COMMUNITY): Payer: Self-pay

## 2021-01-31 DIAGNOSIS — Z794 Long term (current) use of insulin: Secondary | ICD-10-CM

## 2021-01-31 LAB — GLUCOSE, CAPILLARY
Glucose-Capillary: 162 mg/dL — ABNORMAL HIGH (ref 70–99)
Glucose-Capillary: 179 mg/dL — ABNORMAL HIGH (ref 70–99)

## 2021-01-31 LAB — BASIC METABOLIC PANEL
Anion gap: 9 (ref 5–15)
BUN: 15 mg/dL (ref 6–20)
CO2: 29 mmol/L (ref 22–32)
Calcium: 8.6 mg/dL — ABNORMAL LOW (ref 8.9–10.3)
Chloride: 102 mmol/L (ref 98–111)
Creatinine, Ser: 1.39 mg/dL — ABNORMAL HIGH (ref 0.61–1.24)
GFR, Estimated: >60 mL/min (ref >60)
Glucose, Bld: 145 mg/dL — ABNORMAL HIGH (ref 70–99)
Potassium: 3.9 mmol/L (ref 3.5–5.1)
Sodium: 140 mmol/L (ref 135–145)

## 2021-01-31 LAB — MAGNESIUM: Magnesium: 2.1 mg/dL (ref 1.7–2.4)

## 2021-01-31 MED ORDER — CARVEDILOL 6.25 MG PO TABS: 6.2500 mg | ORAL_TABLET | Freq: Two times a day (BID) | ORAL | 3 refills | Status: DC

## 2021-01-31 MED ORDER — CARVEDILOL 6.25 MG PO TABS
6.2500 mg | ORAL_TABLET | Freq: Two times a day (BID) | ORAL | 0 refills | Status: DC
  Filled 2021-01-31: qty 60, 30d supply, fill #0

## 2021-01-31 MED ORDER — GABAPENTIN 300 MG PO CAPS
300.0000 mg | ORAL_CAPSULE | Freq: Three times a day (TID) | ORAL | 0 refills | Status: DC
  Filled 2021-01-31: qty 90, 30d supply, fill #0

## 2021-01-31 MED ORDER — SPIRONOLACTONE 25 MG PO TABS
25.0000 mg | ORAL_TABLET | Freq: Every day | ORAL | 0 refills | Status: DC
  Filled 2021-01-31: qty 30, 30d supply, fill #0

## 2021-01-31 MED ORDER — SACUBITRIL-VALSARTAN 49-51 MG PO TABS: 1.0000 | ORAL_TABLET | Freq: Two times a day (BID) | ORAL | 3 refills | Status: DC

## 2021-01-31 MED ORDER — ATORVASTATIN CALCIUM 40 MG PO TABS
40.0000 mg | ORAL_TABLET | Freq: Every day | ORAL | 0 refills | Status: DC
  Filled 2021-01-31: qty 30, 30d supply, fill #0

## 2021-01-31 MED ORDER — GABAPENTIN 300 MG PO CAPS: 300.0000 mg | ORAL_CAPSULE | Freq: Three times a day (TID) | ORAL | 3 refills | Status: DC

## 2021-01-31 MED ORDER — INSULIN PEN NEEDLE 32G X 4 MM MISC
1.0000 | Freq: Every day | 0 refills | Status: AC
  Filled 2021-01-31: qty 100, 30d supply, fill #0

## 2021-01-31 MED ORDER — EMPAGLIFLOZIN 10 MG PO TABS: 10.0000 mg | ORAL_TABLET | Freq: Every day | ORAL | 3 refills | Status: DC

## 2021-01-31 MED ORDER — METFORMIN HCL 1000 MG PO TABS
1000.0000 mg | ORAL_TABLET | Freq: Two times a day (BID) | ORAL | 0 refills | Status: DC
  Filled 2021-01-31: qty 60, 30d supply, fill #0

## 2021-01-31 MED ORDER — EMPAGLIFLOZIN 10 MG PO TABS
10.0000 mg | ORAL_TABLET | Freq: Every day | ORAL | 2 refills | Status: DC
  Filled 2021-01-31: qty 14, 14d supply, fill #0

## 2021-01-31 MED ORDER — BASAGLAR KWIKPEN 100 UNIT/ML ~~LOC~~ SOPN: 20.0000 [IU] | PEN_INJECTOR | Freq: Every day | SUBCUTANEOUS | 2 refills | Status: DC

## 2021-01-31 MED ORDER — POTASSIUM CHLORIDE CRYS ER 20 MEQ PO TBCR: 20.0000 meq | EXTENDED_RELEASE_TABLET | Freq: Three times a day (TID) | ORAL | 3 refills | Status: DC

## 2021-01-31 MED ORDER — SPIRONOLACTONE 25 MG PO TABS: 25.0000 mg | ORAL_TABLET | Freq: Every day | ORAL | 3 refills | Status: DC

## 2021-01-31 MED ORDER — METFORMIN HCL 1000 MG PO TABS: 1000.0000 mg | ORAL_TABLET | Freq: Two times a day (BID) | ORAL | 3 refills | Status: DC

## 2021-01-31 MED ORDER — SACUBITRIL-VALSARTAN 49-51 MG PO TABS
1.0000 | ORAL_TABLET | Freq: Two times a day (BID) | ORAL | 0 refills | Status: DC
  Filled 2021-01-31: qty 60, 30d supply, fill #0

## 2021-01-31 MED ORDER — FUROSEMIDE 40 MG PO TABS
40.0000 mg | ORAL_TABLET | Freq: Two times a day (BID) | ORAL | 0 refills | Status: DC
  Filled 2021-01-31: qty 60, 30d supply, fill #0

## 2021-01-31 MED ORDER — FUROSEMIDE 40 MG PO TABS: 40.0000 mg | ORAL_TABLET | Freq: Two times a day (BID) | ORAL | 3 refills | Status: DC

## 2021-01-31 MED ORDER — INSULIN GLARGINE 100 UNIT/ML SOLOSTAR PEN
20.0000 [IU] | PEN_INJECTOR | Freq: Every day | SUBCUTANEOUS | 0 refills | Status: DC
  Filled 2021-01-31: qty 6, 30d supply, fill #0

## 2021-01-31 MED ORDER — ASPIRIN 81 MG PO TBEC
81.0000 mg | DELAYED_RELEASE_TABLET | Freq: Every day | ORAL | 0 refills | Status: AC
  Filled 2021-01-31: qty 90, 90d supply, fill #0

## 2021-01-31 MED ORDER — MUPIROCIN 2 % EX OINT: TOPICAL_OINTMENT | Freq: Every day | CUTANEOUS | 1 refills | Status: DC

## 2021-01-31 MED ORDER — POTASSIUM CHLORIDE CRYS ER 20 MEQ PO TBCR
20.0000 meq | EXTENDED_RELEASE_TABLET | Freq: Three times a day (TID) | ORAL | 0 refills | Status: DC
  Filled 2021-01-31: qty 90, 30d supply, fill #0

## 2021-01-31 NOTE — Progress Notes (Addendum)
  HF Navigation Team   Mr Bink meets criteria for HF [facility] clinic at [facility]. EF < 20%  Provided with HF Booklet.  HFSW met with him today with me. He will need financial assistance.  He has difficulty paying for meds. He has no income or insurance.    Given a scale prior to discharge.   02/07/21 at 11:00   [place] NP_C  3:48 PM

## 2021-01-31 NOTE — Discharge Summary (Signed)
Physician Discharge Summary   Patient name: Terry Young  Admit date:     01/26/2021  Discharge date: 01/31/2021  Discharge Physician: Lewie Chamber   PCP: Pcp, No   Recommendations at discharge:  Follow up with cardiology Check BMP at follow up May need sliding scale insulin if CBGs still elevated or adjust basal insulin further   Discharge Diagnoses Principal Problem:   Acute on chronic combined systolic and diastolic CHF (congestive heart failure) (HCC) Active Problems:   Hypokalemia   DM2 (diabetes mellitus, type 2) (HCC)   HTN (hypertension)   Obesity, Class III, BMI 40-49.9 (morbid obesity) (HCC)   Resolved Diagnoses Resolved Problems:   Demand ischemia [facility])   Hospital Course   Terry Young is a 50 year old male with systolic CHF/and ICM with LVEF <20%, T2DM, HTN, nonadherent to medical therapy who ran out of his medication a months ago and presented to the ED with shortness of breath, lower leg edema, abdominal edema and SOB.   In the ED BNP 714, chest x-ray enlarged cardiac silhouette with vascular congestion, hypokalemia 3.2 troponin 39> 43, hyperglycemia 315.  Patient was found to be in acute systolic CHF exacerbation and admitted for further management. Cardiology was consulted and Terry Young was started on GDMT.   * Acute on chronic combined systolic and diastolic CHF (congestive heart failure) (HCC) - Echo repeated on 01/27/2021 - EF less than 20%, grade 2 diastolic dysfunction.  LV global hypokinesis -Cardiology following, appreciate assistance - Patient started on GDMT: Lasix, Coreg, spironolactone, Jardiance, Entresto -Diuresing well; was net negative 16.5 L at time of discharge  -Repeat echo in approximately 3 months.  If EF still less than 30 to 35%, likely will warrant consideration for ICD - check BMP at follow up especially potassium and renal function   Hypokalemia - Continue potassium supplementation - Frequency increased per cardiology  Obesity, Class  III, BMI 40-49.9 (morbid obesity) (HCC) - Complicates overall prognosis and care - Body mass index is 44.75 kg/m. - Weight Loss and Dietary Counseling given  HTN (hypertension) - See CHF - Continue current regimen  DM2 (diabetes mellitus, type 2) (HCC) - A1c 14.4% on 01/27/2021 -Continue glargine at discharge; 30 units daily - may require SSI still at follow up; review glucose logs if able - started gabapentin for neuropathy pains  Demand ischemia (HCC)-resolved as of 01/31/2021 - Elevated troponins considered due to demand ischemia in setting of underlying severe systolic CHF - continue GDMT      Procedures performed:    Condition at discharge: stable  Exam Physical Exam Constitutional:      General: Terry Young is not in acute distress.    Appearance: Normal appearance. Terry Young is obese. Terry Young is not ill-appearing.  HENT:     Head: Normocephalic and atraumatic.     Mouth/Throat:     Mouth: Mucous membranes are moist.  Eyes:     Extraocular Movements: Extraocular movements intact.  Cardiovascular:     Rate and Rhythm: Normal rate and regular rhythm.     Heart sounds: Normal heart sounds.  Pulmonary:     Effort: Pulmonary effort is normal.     Breath sounds: Normal breath sounds. No wheezing.  Abdominal:     General: Bowel sounds are normal. There is no distension.     Palpations: Abdomen is soft.     Tenderness: There is no abdominal tenderness.  Musculoskeletal:        General: Normal range of motion.     Cervical back: Normal range  of motion and neck supple.     Comments: 3+ LE edema, obese extremities; edema significantly improved from admission  Skin:    General: Skin is warm and dry.  Neurological:     General: No focal deficit present.     Mental Status: Terry Young is alert.  Psychiatric:        Mood and Affect: Mood normal.        Behavior: Behavior normal.     Disposition: Home  Discharge time: greater than 30 minutes.  Follow-up Information     [facility]. Go on 02/15/2021.   Why: @8 :50am Contact information: [address] [place] [phone]                Allergies as of 01/31/2021       Reactions   Lisinopril Cough        Medication List     STOP taking these medications    metoprolol tartrate 25 MG tablet Commonly known as: LOPRESSOR       TAKE these medications    aspirin EC 81 MG tablet Take 81 mg by mouth daily. Swallow whole.   atorvastatin 40 MG tablet Commonly known as: LIPITOR Take 1 tablet (40 mg total) by mouth daily.   Basaglar KwikPen 100 UNIT/ML Inject 20 Units into the skin daily for 14 days. What changed:  how much to take when to take this   carvedilol 6.25 MG tablet Commonly known as: COREG Take 1 tablet (6.25 mg total) by mouth 2 (two) times daily with a meal.   Centrum Men Tabs Take 1 tablet by mouth daily.   empagliflozin 10 MG Tabs tablet Commonly known as: JARDIANCE Take 1 tablet (10 mg total) by mouth daily. Start taking on: February 01, 2021   furosemide 40 MG tablet Commonly known as: LASIX Take 1 tablet (40 mg total) by mouth 2 (two) times daily. What changed: when to take this   gabapentin 300 MG capsule Commonly known as: NEURONTIN Take 1 capsule (300 mg total) by mouth 3 (three) times daily.   ibuprofen 200 MG tablet Commonly known as: ADVIL Take 400 mg by mouth every 6 (six) hours as needed for headache or moderate pain.   metFORMIN 1000 MG tablet Commonly known as: GLUCOPHAGE Take 1 tablet (1,000 mg total) by mouth 2 (two) times daily with a meal.   mupirocin ointment 2 % Commonly known as: BACTROBAN Apply topically daily. Cleanse right great toe wound with saline and pat dry. Apply mupirocin ointment to open nail bed.  Cover with dry dressing and tape. Change daily. Start taking on: February 01, 2021   potassium chloride SA 20 MEQ tablet Commonly known as: KLOR-CON Take 1 tablet (20 mEq  total) by mouth 3 (three) times daily.   sacubitril-valsartan 49-51 MG Commonly known as: ENTRESTO Take 1 tablet by mouth 2 (two) times daily.   spironolactone 25 MG tablet Commonly known as: ALDACTONE Take 1 tablet (25 mg total) by mouth daily. Start taking on: February 01, 2021               Discharge Care Instructions  (From admission, onward)           Start     Ordered   01/31/21 0000  Discharge wound care:       Comments: Apply mupirocin ointment to open nail bed on right great toe. Cover with dry dressing and tape. Change daily   01/31/21  1325            DG Chest 2 View  Result Date: 01/26/2021 CLINICAL DATA:  Shortness of breath, leg and abdomen swelling for 1.5 weeks, chest pain, history of fluid on the lungs EXAM: CHEST - 2 VIEW COMPARISON:  06/04/2020 FINDINGS: Enlargement of cardiac silhouette with pulmonary vascular congestion. Mediastinal contours normal. Minimal RIGHT basilar atelectasis. Lungs otherwise clear. No infiltrate, pleural effusion, or pneumothorax. IMPRESSION: Enlargement of cardiac silhouette with pulmonary vascular congestion. Minimal RIGHT basilar atelectasis. Electronically Signed   By: Ulyses Southward M.D.   On: 01/26/2021 18:27   ECHOCARDIOGRAM COMPLETE  Result Date: 01/27/2021    ECHOCARDIOGRAM REPORT   Patient Name:   Terry Young Date of Exam: 01/27/2021 Medical Rec #:  [id number]  Height:       70.0 in Accession #:    [id number] Weight:       347.0 lb Date of Birth:  08-Apr-1971  BSA:          2.638 m Patient Age:    49 years   BP:           151/127 mmHg Patient Gender: M          HR:           83 bpm. Exam Location:  Inpatient Procedure: 2D Echo Indications:    congestive heart failure  History:        Patient has prior history of Echocardiogram examinations.                 Signs/Symptoms:Edema; Risk Factors:Diabetes and Hypertension.  Sonographer:    Delcie Roch RDCS Referring Phys: [phone] JARED M GARDNER  Sonographer Comments: Patient  is morbidly obese. Image acquisition challenging due to patient body habitus. IMPRESSIONS  1. Left ventricular ejection fraction, by estimation, is <20%. The left ventricle has severely decreased function. The left ventricle demonstrates global hypokinesis. The left ventricular internal cavity size was moderately dilated. Left ventricular diastolic parameters are consistent with Grade II diastolic dysfunction (pseudonormalization). Elevated left atrial pressure.  2. Right ventricular systolic function is severely reduced. The right ventricular size is mildly enlarged. There is moderately elevated pulmonary artery systolic pressure.  3. Left atrial size was mildly dilated.  4. The mitral valve is normal in structure. Mild mitral valve regurgitation. No evidence of mitral stenosis.  5. Tricuspid valve regurgitation is mild to moderate.  6. The aortic valve is tricuspid. Aortic valve regurgitation is not visualized. Mild aortic valve sclerosis is present, with no evidence of aortic valve stenosis.  7. The inferior vena cava is dilated in size with <50% respiratory variability, suggesting right atrial pressure of 15 mmHg. FINDINGS  Left Ventricle: Left ventricular ejection fraction, by estimation, is <20%. The left ventricle has severely decreased function. The left ventricle demonstrates global hypokinesis. The left ventricular internal cavity size was moderately dilated. There is no left ventricular hypertrophy. Left ventricular diastolic parameters are consistent with Grade II diastolic dysfunction (pseudonormalization). Elevated left atrial pressure. Right Ventricle: The right ventricular size is mildly enlarged. Right ventricular systolic function is severely reduced. There is moderately elevated pulmonary artery systolic pressure. The tricuspid regurgitant velocity is 3.07 m/s, and with an assumed right atrial pressure of 15 mmHg, the estimated right ventricular systolic pressure is 52.7 mmHg. Left Atrium: Left  atrial size was mildly dilated. Right Atrium: Right atrial size was normal in size. Pericardium: There is no evidence of pericardial effusion. Mitral Valve: The mitral valve is normal in structure. Mild mitral  annular calcification. Mild mitral valve regurgitation. No evidence of mitral valve stenosis. Tricuspid Valve: The tricuspid valve is normal in structure. Tricuspid valve regurgitation is mild to moderate. No evidence of tricuspid stenosis. Aortic Valve: The aortic valve is tricuspid. Aortic valve regurgitation is not visualized. Mild aortic valve sclerosis is present, with no evidence of aortic valve stenosis. Pulmonic Valve: The pulmonic valve was normal in structure. Pulmonic valve regurgitation is mild. No evidence of pulmonic stenosis. Aorta: The aortic root is normal in size and structure. Venous: The inferior vena cava is dilated in size with less than 50% respiratory variability, suggesting right atrial pressure of 15 mmHg. IAS/Shunts: No atrial level shunt detected by color flow Doppler.  LEFT VENTRICLE PLAX 2D LVIDd:         5.90 cm      Diastology LVIDs:         5.50 cm      LV e' medial:    4.90 cm/s LV PW:         1.00 cm      LV E/e' medial:  17.4 LV IVS:        1.00 cm      LV e' lateral:   5.11 cm/s LVOT diam:     2.40 cm      LV E/e' lateral: 16.7 LV SV:         48 LV SV Index:   18 LVOT Area:     4.52 cm  LV Volumes (MOD) LV vol d, MOD A4C: 112.0 ml LV vol s, MOD A4C: 95.2 ml LV SV MOD A4C:     112.0 ml RIGHT VENTRICLE             IVC RV S prime:     11.90 cm/s  IVC diam: 2.30 cm TAPSE (M-mode): 1.4 cm LEFT ATRIUM             Index        RIGHT ATRIUM           Index LA diam:        4.60 cm 1.74 cm/m   RA Area:     16.70 cm LA Vol (A2C):   94.8 ml 35.93 ml/m  RA Volume:   43.60 ml  16.53 ml/m LA Vol (A4C):   83.0 ml 31.46 ml/m LA Biplane Vol: 91.7 ml 34.76 ml/m  AORTIC VALVE LVOT Vmax:   61.60 cm/s LVOT Vmean:  38.800 cm/s LVOT VTI:    0.107 m  AORTA Ao Root diam: 3.20 cm Ao Asc diam:   3.20 cm MITRAL VALVE               TRICUSPID VALVE MV Area (PHT): 4.60 cm    TR Peak grad:   37.7 mmHg MV Decel Time: 165 msec    TR Vmax:        307.00 cm/s MV E velocity: 85.40 cm/s MV A velocity: 49.00 cm/s  SHUNTS MV E/A ratio:  1.74        Systemic VTI:  0.11 m                            Systemic Diam: 2.40 cm Olga Millers MD Electronically signed by Olga Millers MD Signature Date/Time: 01/27/2021/12:56:29 PM    Final    Results for orders placed or performed during the hospital encounter of 01/26/21  Resp Panel by RT-PCR (Flu A&B, Covid) Nasopharyngeal Swab  Status: None   Collection Time: 01/26/21  4:58 PM   Specimen: Nasopharyngeal Swab; Nasopharyngeal(NP) swabs in vial transport medium  Result Value Ref Range Status   SARS Coronavirus 2 by RT PCR NEGATIVE NEGATIVE Final    Comment: (NOTE) SARS-CoV-2 target nucleic acids are NOT DETECTED.  The SARS-CoV-2 RNA is generally detectable in upper respiratory specimens during the acute phase of infection. The lowest concentration of SARS-CoV-2 viral copies this assay can detect is 138 copies/mL. A negative result does not preclude SARS-Cov-2 infection and should not be used as the sole basis for treatment or other patient management decisions. A negative result may occur with  improper specimen collection/handling, submission of specimen other than nasopharyngeal swab, presence of viral mutation(s) within the areas targeted by this assay, and inadequate number of viral copies(<138 copies/mL). A negative result must be combined with clinical observations, patient history, and epidemiological information. The expected result is Negative.  Fact Sheet for Patients:  BloggerCourse.com  Fact Sheet for Healthcare Providers:  SeriousBroker.it  This test is no t yet approved or cleared by the [place] FDA and  has been authorized for detection and/or diagnosis of SARS-CoV-2 by FDA  under an Emergency Use Authorization (EUA). This EUA will remain  in effect (meaning this test can be used) for the duration of the COVID-19 declaration under Section 564(b)(1) of the Act, 21 U.S.C.section 360bbb-3(b)(1), unless the authorization is terminated  or revoked sooner.       Influenza A by PCR NEGATIVE NEGATIVE Final   Influenza B by PCR NEGATIVE NEGATIVE Final    Comment: (NOTE) The Xpert Xpress SARS-CoV-2/FLU/RSV plus assay is intended as an aid in the diagnosis of influenza from Nasopharyngeal swab specimens and should not be used as a sole basis for treatment. Nasal washings and aspirates are unacceptable for Xpert Xpress SARS-CoV-2/FLU/RSV testing.  Fact Sheet for Patients: BloggerCourse.com  Fact Sheet for Healthcare Providers: SeriousBroker.it  This test is not yet approved or cleared by the [place] FDA and has been authorized for detection and/or diagnosis of SARS-CoV-2 by FDA under an Emergency Use Authorization (EUA). This EUA will remain in effect (meaning this test can be used) for the duration of the COVID-19 declaration under Section 564(b)(1) of the Act, 21 U.S.C. section 360bbb-3(b)(1), unless the authorization is terminated or revoked.  Performed at [facility], [address], [place], [place] [address]     Signed:  Lewie Chamber MD.  [facility] 01/31/2021, 1:28 PM

## 2021-01-31 NOTE — Progress Notes (Signed)
Subjective:  Doing well.  Denies any chest pain or shortness of breath, leg swelling resolved.  Objective:  Vital Signs in the last 24 hours: Temp:  [98.2 F (36.8 C)-98.6 F (37 C)] 98.5 F (36.9 C) (10/17 1118) Pulse Rate:  [87-93] 87 (10/17 1118) Resp:  [17-20] 19 (10/17 1118) BP: (115-125)/(78-92) 123/92 (10/17 1118) SpO2:  [95 %-99 %] 99 % (10/17 1118) Weight:  [135.4 kg] 135.4 kg (10/17 0622)  Intake/Output from previous day: 10/16 0701 - 10/17 0700 In: 960 [P.O.:960] Out: 4275 [Urine:4275] Intake/Output from this shift: Total I/O In: 472 [P.O.:472] Out: -   Physical Exam: Neck: no adenopathy, no carotid bruit, no JVD, and supple, symmetrical, trachea midline Lungs: clear to auscultation bilaterally Heart: regular rate and rhythm, S1, S2 normal, and 2/6 systolic murmur noted Abdomen: soft, non-tender; bowel sounds normal; no masses,  no organomegaly Extremities: extremities normal, atraumatic, no cyanosis or edema  Lab Results: No results for input(s): WBC, HGB, PLT in the last 72 hours. Recent Labs    01/30/21 0347 01/31/21 0411  NA 140 140  K 3.4* 3.9  CL 101 102  CO2 29 29  GLUCOSE 146* 145*  BUN 15 15  CREATININE 1.32* 1.39*   No results for input(s): TROPONINI in the last 72 hours.  Invalid input(s): CK, MB Hepatic Function Panel No results for input(s): PROT, ALBUMIN, AST, ALT, ALKPHOS, BILITOT, BILIDIR, IBILI in the last 72 hours. No results for input(s): CHOL in the last 72 hours. No results for input(s): PROTIME in the last 72 hours.  Imaging: No results found.  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 Plan Continue present management. Okay to discharge from cardiac point of view. Follow-up with me in one week. Heart failure.  Instructions have been given. Will need repeat echo in few months and if EF remains below 35%, will refer him to EP for possible ICD  LOS: 4 days    Rinaldo Cloud 01/31/2021, 12:59 PM

## 2021-01-31 NOTE — Plan of Care (Signed)

## 2021-01-31 NOTE — TOC Transition Note (Signed)
Transition of Care [facility]) - CM/SW Discharge Note   Patient Details  Name: Terry Young MRN: [id number] Date of Birth: 1970-07-03  [facility] CM/SW Contact:  Leone Haven, RN Phone Number: 01/31/2021, 2:42 PM   Clinical Narrative:    Patient is for discharge today, he has follow up with [facility].  NCM assisted him with the Match letter for his medications.  He states he has transportation at discharge.  He does not have a scale but will purchase one for himself and also he will purchase a bp cuff.  He states he eats a low sodium diet. He will continue to follow up on these needs to take care of his health.  TOC filling medications for him.   Final next level of care: Home/Self Care Barriers to Discharge: No Barriers Identified   Patient Goals and CMS Choice Patient states their goals for this hospitalization and ongoing recovery are:: return home   Choice offered to / list presented to : NA  Discharge Placement                       Discharge Plan and Services                  DME Agency: NA       HH Arranged: NA          Social Determinants of Health (SDOH) Interventions     Readmission Risk Interventions No flowsheet data found.

## 2021-02-07 ENCOUNTER — Ambulatory Visit (HOSPITAL_COMMUNITY)
Admit: 2021-02-07 | Discharge: 2021-02-07 | Disposition: A | Discharge status: Home / Self Care | Payer: Self-pay | Source: Ambulatory Visit | Attending: Cardiology | Admitting: Cardiology

## 2021-02-07 ENCOUNTER — Other Ambulatory Visit: Payer: Self-pay

## 2021-02-07 ENCOUNTER — Encounter (HOSPITAL_COMMUNITY): Payer: Self-pay

## 2021-02-07 VITALS — BP 110/80 | HR 67 | Ht 70.0 in | Wt 287.0 lb

## 2021-02-07 DIAGNOSIS — Z7901 Long term (current) use of anticoagulants: Secondary | ICD-10-CM | POA: Insufficient documentation

## 2021-02-07 DIAGNOSIS — I5022 Chronic systolic (congestive) heart failure: Secondary | ICD-10-CM | POA: Insufficient documentation

## 2021-02-07 DIAGNOSIS — E669 Obesity, unspecified: Secondary | ICD-10-CM | POA: Insufficient documentation

## 2021-02-07 DIAGNOSIS — Z79899 Other long term (current) drug therapy: Secondary | ICD-10-CM | POA: Insufficient documentation

## 2021-02-07 DIAGNOSIS — I5082 Biventricular heart failure: Secondary | ICD-10-CM | POA: Insufficient documentation

## 2021-02-07 DIAGNOSIS — Z7984 Long term (current) use of oral hypoglycemic drugs: Secondary | ICD-10-CM | POA: Insufficient documentation

## 2021-02-07 DIAGNOSIS — Z8249 Family history of ischemic heart disease and other diseases of the circulatory system: Secondary | ICD-10-CM | POA: Insufficient documentation

## 2021-02-07 DIAGNOSIS — E1165 Type 2 diabetes mellitus with hyperglycemia: Secondary | ICD-10-CM | POA: Insufficient documentation

## 2021-02-07 DIAGNOSIS — Z888 Allergy status to other drugs, medicaments and biological substances status: Secondary | ICD-10-CM | POA: Insufficient documentation

## 2021-02-07 DIAGNOSIS — Z7982 Long term (current) use of aspirin: Secondary | ICD-10-CM | POA: Insufficient documentation

## 2021-02-07 DIAGNOSIS — I110 Hypertensive heart disease with heart failure: Secondary | ICD-10-CM | POA: Insufficient documentation

## 2021-02-07 DIAGNOSIS — R0602 Shortness of breath: Secondary | ICD-10-CM | POA: Insufficient documentation

## 2021-02-07 DIAGNOSIS — R42 Dizziness and giddiness: Secondary | ICD-10-CM | POA: Insufficient documentation

## 2021-02-07 DIAGNOSIS — Z794 Long term (current) use of insulin: Secondary | ICD-10-CM | POA: Insufficient documentation

## 2021-02-07 DIAGNOSIS — I10 Essential (primary) hypertension: Secondary | ICD-10-CM

## 2021-02-07 DIAGNOSIS — Z6841 Body Mass Index (BMI) 40.0 and over, adult: Secondary | ICD-10-CM | POA: Insufficient documentation

## 2021-02-07 DIAGNOSIS — E785 Hyperlipidemia, unspecified: Secondary | ICD-10-CM | POA: Insufficient documentation

## 2021-02-07 LAB — BASIC METABOLIC PANEL
Anion gap: 10 (ref 5–15)
BUN: 21 mg/dL — ABNORMAL HIGH (ref 6–20)
CO2: 25 mmol/L (ref 22–32)
Calcium: 8.9 mg/dL (ref 8.9–10.3)
Chloride: 102 mmol/L (ref 98–111)
Creatinine, Ser: 1.76 mg/dL — ABNORMAL HIGH (ref 0.61–1.24)
GFR, Estimated: 47 mL/min — ABNORMAL LOW (ref >60)
Glucose, Bld: 176 mg/dL — ABNORMAL HIGH (ref 70–99)
Potassium: 5.2 mmol/L — ABNORMAL HIGH (ref 3.5–5.1)
Sodium: 137 mmol/L (ref 135–145)

## 2021-02-07 MED ORDER — POTASSIUM CHLORIDE CRYS ER 20 MEQ PO TBCR: 20.0000 meq | EXTENDED_RELEASE_TABLET | Freq: Two times a day (BID) | ORAL | 11 refills | Status: DC

## 2021-02-07 MED ORDER — FUROSEMIDE 40 MG PO TABS: 40.0000 mg | ORAL_TABLET | Freq: Every day | ORAL | 11 refills | Status: DC

## 2021-02-07 MED ORDER — ENTRESTO 97-103 MG PO TABS: 1.0000 | ORAL_TABLET | Freq: Two times a day (BID) | ORAL | 11 refills | Status: DC

## 2021-02-07 NOTE — Progress Notes (Addendum)
HEART & VASCULAR TRANSITION OF CARE CONSULT NOTE     Referring Physician: Dr. Cecil Cranker, Hospitalist  Primary Care:Pcp, No  Primary Cardiologist: Dr. Sharyn Lull   HPI: Referred to clinic by Dr. Cecil Cranker for heart failure consultation.   50 y/o male w/ chronic biventricular systolic heart failure, HTN, poorly controlled T2DM, HLD and obesity. Previously followed by cardiology at [facility]. Per Pt report, he thinks he was diagnosed w/ CHF ~2015 though no records are available during that time. He recalls being diagnosed w/ CHF shortly after a bout w/ a viral illness, which he believes was flu. Per records in Care Everywhere, he had an Echo in 2018 w/ EF 40-45%. NST showed no ischemia. CM felt likely NICM from uncontrolled HTN, per notes from [facility]. He has never had a LHC. Uncertain if he has had a cMRI.   Recently admitted to [facility] for a/c CHF and seen by Dr. Sharyn Lull. Echo w/ severe biventricular dysfunction. LVEF down to <20%, RV severely reduced w/ global HK, GIIDD, no LVH, moderately elevated RVSP 52 mmHg w/ mod TR. Only mild MR. He was diuresed w/ IV Lasix and placed on GDMT. On Jardiance, Entresto, Spiro and Coreg. Discharge wt 297 lb. Of note, his DM is poorly controlled, recent Hgb A1c 14.   Referred to [facility] clinic for post hospital f/u. Doing well. Wt down an additional 10 lb. Denies resting dyspnea. No difficulties w/ ADLs. Still w/ mild SOB walking up stairs. No LEE, orthopnea/PND. Denies CP. BP well controlled at 110/80. Has noticed occasional positional dizziness but no syncope/ near syncope.  Family h/o is notable for CHF, CAD and Afib (father).  He denies any personal h/o ETOH use. No tobacco use.   He has been told that he snores but has never had a sleep study.   Currently not working and uninsured. 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    Cardiac Testing   Care Everywhere [facility]) Echo 2018 EF 40-45% NST 2018>> No ischemia    2D Echo 01/27/21 Left ventricular  ejection fraction, by estimation, is <20%. The left ventricle has severely decreased function. The left ventricle demonstrates global hypokinesis. The left ventricular internal cavity size was moderately dilated. Left ventricular diastolic parameters are consistent with Grade II diastolic dysfunction (pseudonormalization). Elevated left atrial pressure. 1. Right ventricular systolic function is severely reduced. The right ventricular size is mildly enlarged. There is moderately elevated pulmonary artery systolic pressure. 2. 3. Left atrial size was mildly dilated. The mitral valve is normal in structure. Mild mitral valve regurgitation. No evidence of mitral stenosis. 4. 5. Tricuspid valve regurgitation is mild to moderate. The aortic valve is tricuspid. Aortic valve regurgitation is not visualized. Mild aortic valve sclerosis is present, with no evidence of aortic valve stenosis. 6. The inferior vena cava is dilated in size with <50% respiratory variability, suggesting right atrial pressure of 15 mmHg.   Review of Systems: [y] = yes, [ ]  = no   General: Weight gain [ ] ; Weight loss [Y ]; Anorexia [ ] ; Fatigue [ ] ; Fever [ ] ; Chills [ ] ; Weakness [ ]   Cardiac: Chest pain/pressure [ ] ; Resting SOB [ ] ; Exertional SOB [Y ]; Orthopnea [ ] ; Pedal Edema [ ] ; Palpitations [ ] ; Syncope [ ] ; Presyncope [ ] ; Paroxysmal nocturnal dyspnea[ ]   Pulmonary: Cough [ ] ; Wheezing[ ] ; Hemoptysis[ ] ; Sputum [ ] ; Snoring [ Y]  GI: Vomiting[ ] ; Dysphagia[ ] ; Melena[ ] ; Hematochezia [ ] ; Heartburn[ ] ; Abdominal pain [ ] ;  Constipation [ ] ; Diarrhea [ ] ; BRBPR [ ]   GU: Hematuria[ ] ; Dysuria [ ] ; Nocturia[ ]   Vascular: Pain in legs with walking [ ] ; Pain in feet with lying flat [ ] ; Non-healing sores [ ] ; Stroke [ ] ; TIA [ ] ; Slurred speech [ ] ;  Neuro: Headaches[ ] ; Vertigo[ ] ; Seizures[ ] ; Paresthesias[ ] ;Blurred vision [ ] ; Diplopia [ ] ; Vision changes [ ]   Ortho/Skin: Arthritis [ ] ; Joint pain [ ] ; Muscle  pain [ ] ; Joint swelling [ ] ; Back Pain [ ] ; Rash [ ]   Psych: Depression[ ] ; Anxiety[ ]   Heme: Bleeding problems [ ] ; Clotting disorders [ ] ; Anemia [ ]   Endocrine: Diabetes [Y ]; Thyroid dysfunction[ ]    Past Medical History:  Diagnosis Date   CHF (congestive heart failure) (HCC)    Diabetes mellitus without complication (HCC)    Hypertension     Current Outpatient Medications  Medication Sig Dispense Refill   aspirin 81 MG EC tablet Take 1 tablet (81 mg total) by mouth daily. 90 tablet 0   atorvastatin (LIPITOR) 40 MG tablet Take 1 tablet (40 mg total) by mouth daily. 90 tablet 0   carvedilol (COREG) 6.25 MG tablet Take 1 tablet (6.25 mg total) by mouth 2 (two) times daily with a meal. 60 tablet 0   empagliflozin (JARDIANCE) 10 MG TABS tablet Take 1 tablet (10 mg total) by mouth daily. 30 tablet 2   gabapentin (NEURONTIN) 300 MG capsule Take 1 capsule (300 mg total) by mouth 3 (three) times daily. 90 capsule 0   ibuprofen (ADVIL) 200 MG tablet Take 400 mg by mouth every 6 (six) hours as needed for headache or moderate pain.     insulin glargine (LANTUS) 100 UNIT/ML Solostar Pen Inject 20 Units into the skin daily 6 mL 0   Insulin Pen Needle 32G X 4 MM MISC Use to inject insulin as directed. 100 each 0   metFORMIN (GLUCOPHAGE) 1000 MG tablet Take 1 tablet (1,000 mg total) by mouth 2 (two) times daily with a meal. 60 tablet 0   Multiple Vitamins-Minerals (CENTRUM MEN) TABS Take 1 tablet by mouth daily.     mupirocin ointment (BACTROBAN) 2 % Apply topically daily. Cleanse right great toe wound with saline and pat dry. Apply mupirocin ointment to open nail bed.  Cover with dry dressing and tape. Change daily. 30 g 1   sacubitril-valsartan (ENTRESTO) 97-103 MG Take 1 tablet by mouth 2 (two) times daily. 60 tablet 11   spironolactone (ALDACTONE) 25 MG tablet Take 1 tablet (25 mg total) by mouth daily. 30 tablet 0   furosemide (LASIX) 40 MG tablet Take 1 tablet (40 mg total) by mouth daily. 30  tablet 11   potassium chloride SA (KLOR-CON) 20 MEQ tablet Take 1 tablet (20 mEq total) by mouth 2 (two) times daily. 60 tablet 11   No current facility-administered medications for this encounter.    Allergies  Allergen Reactions   Lisinopril Cough      Social History   Socioeconomic History   Marital status: Single    Spouse name: Not on file   Number of children: Not on file   Years of education: Not on file   Highest education level: Not on file  Occupational History   Not on file  Tobacco Use   Smoking status: Never   Smokeless tobacco: Never  Substance and Sexual Activity   Alcohol use: Never   Drug use: Never   Sexual activity: Not on file  Other Topics Concern   Not on file  Social History Narrative   Not on file   Social Determinants of Health   Financial Resource Strain: Not on file  Food Insecurity: Not on file  Transportation Needs: Not on file  Physical Activity: Not on file  Stress: Not on file  Social Connections: Not on file  Intimate Partner Violence: Not on file      Family History  Problem Relation Age of Onset   Atrial fibrillation Neg Hx     Vitals:   02/07/21 1053  BP: 110/80  Pulse: 67  SpO2: 98%  Weight: 130.2 kg (287 lb)  Height: 5\' 10"  (1.778 m)    PHYSICAL EXAM: General:  Well appearing, moderately obese. No respiratory difficulty HEENT: normal Neck: supple. no JVD. Carotids 2+ bilat; no bruits. No lymphadenopathy or thryomegaly appreciated. Cor: PMI nondisplaced. Regular rate & rhythm. No rubs, gallops or murmurs. Lungs: clear Abdomen: obese, soft, nontender, nondistended. No hepatosplenomegaly. No bruits or masses. Good bowel sounds. Extremities: no cyanosis, clubbing, rash, edema Neuro: alert & oriented x 3, cranial nerves grossly intact. moves all 4 extremities w/o difficulty. Affect pleasant.  ECG: not performed    ASSESSMENT & PLAN:  1. Chronic Biventricular Systolic Heart Failure   - presumed NICM, likely  hypertensive CM but cannot r/o viral CM  - Echo 2018 ([facility]) EF 40-45%, NST low risk. Has never had LHC nor cMRI  - Echo 10/22 EF <20%, RV severely reduced  - NYHA Class II. Euvolemic on exam. Wt down 10 lb post hospital   - Continue Jardiance 10 mg  - Increase Entresto to 97-103 mg bid - Reduce Lasix to 40 mg once daily  - Continue Spironolactone 25 mg daily  - Continues Coreg 6.25 mg bid - BMP today and again in 7 days  - He will f/u w/ Dr. 11/22 and will need further gradual med titration w/ repeat echo in 3 months. Dr. Sharyn Lull progress note outlines intent to refer to EP if EF remains <35%, despite medical therapy.  - Given young age and severe biventricular dysfunction, would also recommend referral to the [facility] for consideration for advanced therapies. Will refer to Dr. [facility]. Also consider definitive LHC to r/o CAD given multiple CFRs, cMRI  + sleep study to r/o OSA.    2. Hypertension  - well controlled on current regimen - GDMT optimization per above  - BMP today + again in 7 days  - Recommend sleep study to r/o OSA  3. Type 2DM  - poorly controlled, recent A1c 14 - on Jardiance + Insulin - per PCP   4. Obesity  - Body mass index is 41.18 kg/m. - He has lost 10 lb post d/c - congratulated on efforts, further wt loss advised   5. SDOH  - uninsured and w/ difficulties affording medications, which will impact ability to remain compliant w/ HF regimen.  - SW consulted to assist w/ insurance application  - recommend referral to the [facility]   NYHA II GDMT  Diuretic- Lasix 40 mg (reduce down from bid to once daily ) BB- Coreg 6.25 mg bid  Ace/ARB/ARNI Entresto (increase to 97-103 mg bid) MRA- spiro 25 mg daily  SGLT2i -Jardiance 10 mg daily     Referred to HFSW (PCP, Medications, Transportation, ETOH Abuse, Drug Abuse, Insurance, [facility] ): Yes Refer to Pharmacy:  No Refer to Home Health:  No Refer to Advanced Heart Failure Clinic: Yes  Refer to General  Cardiology: Yes (See's  Dr. Sharyn Lull)   Follow up  in 8 weeks

## 2021-02-07 NOTE — Patient Instructions (Addendum)
INCREASE Entresto to 97/103 mg, one tab twice a day DECREASE Potassium to 20 meq, one tab twice a day DECREASE Furosemide to 40 mg, one tab daily  Labs today We will only contact you if something comes back abnormal or we need to make some changes. Otherwise no news is good news!  Labs needed in 7-10 days  Keep follow up as scheduled with cardiology    Do the following things EVERYDAY: Weigh yourself in the morning before breakfast. Write it down and keep it in a log. Take your medicines as prescribed Eat low salt foods--Limit salt (sodium) to 2000 mg per day.  Stay as active as you can everyday Limit all fluids for the day to less than 2 liters  .

## 2021-02-07 NOTE — Progress Notes (Signed)
Heart and Vascular Care Navigation  02/07/2021  Lytle Malburg Jan 14, 1971 [id number]  Reason for Referral: Patient seen in HF TOC.   Engaged with patient face to face for initial visit for Heart and Vascular Care Coordination.                                                                                                   Assessment: Patient is a 50 yo male who is single. Patient lives with his disabled brother and his nephew. Patient reports he has worked for many years although his broke his ankle in 2021 and unable to get back to work. Patient lives in an apartment and fortunately between his nephew and brothers SSI check they have been able to make ends meet with household finances.  Patient was hoping to return to work but with recent hospitalization will hold off and see about his health.  Patient has no income or insurance at this time and prior to admission had no PCP. He now has an appointment with [facility] for February 15, 2021.                                 HRT/VAS Care Coordination     Patients Home Cardiology Office --  [facility]   Outpatient Care Team Social Worker   Social Worker Name: Lasandra Beech, [place] [phone]   Living arrangements for the past 2 months Apartment   Lives with: Siblings; Relatives  67 yo disabled brother and 23 yo nephew   Patient Current Insurance Coverage Self-Pay   Patient Has Concern With Paying Medical Bills Yes   Medical Bill Referrals: Financial Counseling   Does Patient Have Prescription Coverage? No   Home Assistive Devices/Equipment None   DME Agency NA       Social History:                                                                             SDOH Screenings   Alcohol Screen: Not on file  Depression (PHQ2-9): Not on file  Financial Resource Strain: High Risk   Difficulty of Paying Living Expenses: Very hard  Food Insecurity: Food Insecurity Present   Worried About Running Out of Food in the Last Year: Sometimes true   Ran Out of  Food in the Last Year: Sometimes true  Housing: Low Risk    Last Housing Risk Score: 0  Physical Activity: Not on file  Social Connections: Not on file  Stress: Not on file  Tobacco Use: Low Risk    Smoking Tobacco Use: Never   Smokeless Tobacco Use: Never   Passive Exposure: Not on file  Transportation Needs: No Transportation Needs   Lack of Transportation (Medical): No   Lack of Transportation (  Non-Medical): No    SDOH Interventions: Financial Resources:  Corporate treasurer Interventions: Artist, Other (Comment) (financial assistance for medications) [facility] [facility] for disability and food International aid/development worker Insecurity:  Food Insecurity Interventions: Assist with [employer] Application  Housing Insecurity:  Housing Interventions: Intervention Not Indicated  Transportation:   Transportation Interventions: Intervention Not Indicated   Follow-up plan:  CSW completed an application for [employer] for disability and food stamp applications. CSW stressed the importance of following through with the [facility] appointment for additional assistance with PCP needs as well as pharmacy. Patient provided CSW contact information and verbalizes understanding of follow up with [facility] for disability application. Lasandra Beech, LCSW, CCSW-MCS [phone]

## 2021-02-15 ENCOUNTER — Ambulatory Visit: Payer: Self-pay | Attending: Family Medicine | Admitting: Family Medicine

## 2021-02-15 ENCOUNTER — Other Ambulatory Visit: Payer: Self-pay

## 2021-02-15 ENCOUNTER — Encounter: Payer: Self-pay | Admitting: Family Medicine

## 2021-02-15 VITALS — BP 124/89 | HR 88 | Ht 70.0 in | Wt 283.0 lb

## 2021-02-15 DIAGNOSIS — L603 Nail dystrophy: Secondary | ICD-10-CM

## 2021-02-15 DIAGNOSIS — R42 Dizziness and giddiness: Secondary | ICD-10-CM

## 2021-02-15 DIAGNOSIS — I5043 Acute on chronic combined systolic (congestive) and diastolic (congestive) heart failure: Secondary | ICD-10-CM

## 2021-02-15 DIAGNOSIS — E1165 Type 2 diabetes mellitus with hyperglycemia: Secondary | ICD-10-CM

## 2021-02-15 DIAGNOSIS — Z1159 Encounter for screening for other viral diseases: Secondary | ICD-10-CM

## 2021-02-15 DIAGNOSIS — Z794 Long term (current) use of insulin: Secondary | ICD-10-CM

## 2021-02-15 LAB — GLUCOSE, POCT (MANUAL RESULT ENTRY): POC Glucose: 149 mg/dl — AB (ref 70–99)

## 2021-02-15 MED ORDER — CARVEDILOL 6.25 MG PO TABS
6.2500 mg | ORAL_TABLET | Freq: Two times a day (BID) | ORAL | 1 refills | Status: DC
Start: 2021-02-15 — End: 2021-07-29
  Filled 2021-02-15: qty 180, 90d supply, fill #0
  Filled 2021-03-14: qty 60, 30d supply, fill #0
  Filled 2021-04-12: qty 60, 30d supply, fill #1
  Filled 2021-04-23: qty 60, 30d supply, fill #0
  Filled 2021-05-23: qty 60, 30d supply, fill #1
  Filled 2021-06-22: qty 60, 30d supply, fill #2
  Filled 2021-07-27: qty 60, 30d supply, fill #3

## 2021-02-15 MED ORDER — METFORMIN HCL 1000 MG PO TABS
1000.0000 mg | ORAL_TABLET | Freq: Two times a day (BID) | ORAL | 1 refills | Status: DC
  Filled 2021-02-15: qty 180, 90d supply, fill #0
  Filled 2021-03-14: qty 60, 30d supply, fill #0
  Filled 2021-04-12: qty 60, 30d supply, fill #1
  Filled 2021-04-23: qty 60, 30d supply, fill #0
  Filled 2021-05-23: qty 60, 30d supply, fill #1
  Filled 2021-07-27: qty 60, 30d supply, fill #2
  Filled 2021-08-25: qty 60, 30d supply, fill #3
  Filled 2021-10-10 – 2021-10-24 (×2): qty 60, 30d supply, fill #4

## 2021-02-15 MED ORDER — GABAPENTIN 300 MG PO CAPS
300.0000 mg | ORAL_CAPSULE | Freq: Three times a day (TID) | ORAL | 1 refills | Status: DC
  Filled 2021-02-15 – 2021-03-14 (×2): qty 90, 30d supply, fill #0
  Filled 2021-04-12: qty 90, 30d supply, fill #1
  Filled 2021-04-23: qty 90, 30d supply, fill #0

## 2021-02-15 MED ORDER — EMPAGLIFLOZIN 10 MG PO TABS
10.0000 mg | ORAL_TABLET | Freq: Every day | ORAL | 1 refills | Status: DC
  Filled 2021-02-15: qty 90, 90d supply, fill #0
  Filled 2021-03-14: qty 30, 30d supply, fill #0
  Filled 2021-04-12: qty 30, 30d supply, fill #1
  Filled 2021-04-23: qty 30, 30d supply, fill #0
  Filled 2021-05-23: qty 30, 30d supply, fill #1
  Filled 2021-06-22: qty 30, 30d supply, fill #2
  Filled 2021-07-27: qty 30, 30d supply, fill #3

## 2021-02-15 MED ORDER — ATORVASTATIN CALCIUM 40 MG PO TABS
40.0000 mg | ORAL_TABLET | Freq: Every day | ORAL | 1 refills | Status: DC
  Filled 2021-02-15: qty 90, 90d supply, fill #0
  Filled 2021-03-14: qty 30, 30d supply, fill #0
  Filled 2021-04-12: qty 30, 30d supply, fill #1
  Filled 2021-04-23: qty 30, 30d supply, fill #0
  Filled 2021-05-23: qty 30, 30d supply, fill #1
  Filled 2021-06-22: qty 30, 30d supply, fill #2
  Filled 2021-07-27: qty 30, 30d supply, fill #3

## 2021-02-15 MED ORDER — INSULIN GLARGINE 100 UNIT/ML SOLOSTAR PEN
20.0000 [IU] | PEN_INJECTOR | Freq: Every day | SUBCUTANEOUS | 0 refills | Status: DC
  Filled 2021-02-15 – 2021-03-14 (×2): qty 6, 30d supply, fill #0

## 2021-02-15 MED ORDER — SPIRONOLACTONE 25 MG PO TABS
25.0000 mg | ORAL_TABLET | Freq: Every day | ORAL | 1 refills | Status: DC
  Filled 2021-02-15: qty 90, 90d supply, fill #0
  Filled 2021-03-14: qty 30, 30d supply, fill #0
  Filled 2021-04-12: qty 30, 30d supply, fill #1
  Filled 2021-04-23: qty 30, 30d supply, fill #0
  Filled 2021-05-23: qty 30, 30d supply, fill #1
  Filled 2021-06-22: qty 30, 30d supply, fill #2
  Filled 2021-07-27: qty 30, 30d supply, fill #3

## 2021-02-15 NOTE — Patient Instructions (Signed)

## 2021-02-15 NOTE — Progress Notes (Signed)
Subjective:  Patient ID: Terry Young, male    DOB: 1971-01-22  Age: 50 y.o. MRN: [id number]  CC: New Patient (Initial Visit)   HPI Terry Young is a 50 y.o. year old male with a history of HFrEF (EF<20%,global hypokinesis from echo 01/2021), morbid obesity, type 2 diabetes mellitus (A1c 14.4) who presents to establish care today. Hospitalized 10/26/2020 through 01/31/2021 for acute CHF exacerbation  Interval History:  He has some dyspnea every now and then when he goes up steps. He has lost 70 lbs since his ED presentation till now.  When he takes his medications he gets lightheaded about an hour later.  He does not have a blood pressure monitor to check his blood pressures at home. Has also noticed some nausea with medications which resolves after he throws up and symptoms of lightheadedness improve later in the day.  Denies symptoms of vertigo. Cardiology appointment comes up next week.  With regards to his diabetes mellitus he has been compliant with his medications.  Denies presence of hypoglycemia, numbness in extremities. He has no additional concerns today. Past Medical History:  Diagnosis Date   CHF (congestive heart failure) (HCC)    Diabetes mellitus without complication ([place])    Hypertension     History reviewed. No pertinent surgical history.  Family History  Problem Relation Age of Onset   Atrial fibrillation Neg Hx     Allergies  Allergen Reactions   Lisinopril Cough    Outpatient Medications Prior to Visit  Medication Sig Dispense Refill   aspirin 81 MG EC tablet Take 1 tablet (81 mg total) by mouth daily. 90 tablet 0   furosemide (LASIX) 40 MG tablet Take 1 tablet (40 mg total) by mouth daily. 30 tablet 11   ibuprofen (ADVIL) 200 MG tablet Take 400 mg by mouth every 6 (six) hours as needed for headache or moderate pain.     Insulin Pen Needle 32G X 4 MM MISC Use to inject insulin as directed. 100 each 0   Multiple Vitamins-Minerals (CENTRUM MEN) TABS Take 1  tablet by mouth daily.     mupirocin ointment (BACTROBAN) 2 % Apply topically daily. Cleanse right great toe wound with saline and pat dry. Apply mupirocin ointment to open nail bed.  Cover with dry dressing and tape. Change daily. 30 g 1   potassium chloride SA (KLOR-CON) 20 MEQ tablet Take 1 tablet (20 mEq total) by mouth 2 (two) times daily. 60 tablet 11   sacubitril-valsartan (ENTRESTO) 97-103 MG Take 1 tablet by mouth 2 (two) times daily. 60 tablet 11   atorvastatin (LIPITOR) 40 MG tablet Take 1 tablet (40 mg total) by mouth daily. 90 tablet 0   carvedilol (COREG) 6.25 MG tablet Take 1 tablet (6.25 mg total) by mouth 2 (two) times daily with a meal. 60 tablet 0   empagliflozin (JARDIANCE) 10 MG TABS tablet Take 1 tablet (10 mg total) by mouth daily. 30 tablet 2   gabapentin (NEURONTIN) 300 MG capsule Take 1 capsule (300 mg total) by mouth 3 (three) times daily. 90 capsule 0   insulin glargine (LANTUS) 100 UNIT/ML Solostar Pen Inject 20 Units into the skin daily 6 mL 0   metFORMIN (GLUCOPHAGE) 1000 MG tablet Take 1 tablet (1,000 mg total) by mouth 2 (two) times daily with a meal. 60 tablet 0   spironolactone (ALDACTONE) 25 MG tablet Take 1 tablet (25 mg total) by mouth daily. 30 tablet 0   No facility-administered medications prior to visit.  ROS Review of Systems  Constitutional:  Negative for activity change and appetite change.  HENT:  Negative for sinus pressure and sore throat.   Eyes:  Negative for visual disturbance.  Respiratory:  Negative for cough, chest tightness and shortness of breath.   Cardiovascular:  Negative for chest pain and leg swelling.  Gastrointestinal:  Positive for nausea. Negative for abdominal distention, abdominal pain, constipation and diarrhea.  Endocrine: Negative.   Genitourinary:  Negative for dysuria.  Musculoskeletal:  Negative for joint swelling and myalgias.  Skin:  Negative for rash.  Allergic/Immunologic: Negative.   Neurological:  Positive  for light-headedness. Negative for weakness and numbness.  Psychiatric/Behavioral:  Negative for dysphoric mood and suicidal ideas.    Objective:  BP 124/89   Pulse 88   Ht 5\' 10"  (1.778 m)   Wt 283 lb (128.4 kg)   SpO2 100%   BMI 40.61 kg/m   BP/Weight 02/15/2021 02/07/2021 [id number]  Systolic BP [id number]  Diastolic BP 89 80 92  Wt. (Lbs) 283 287 298.5  BMI 40.61 41.18 42.83      Physical Exam Constitutional:      Appearance: He is well-developed. He is obese.  Cardiovascular:     Rate and Rhythm: Normal rate.     Heart sounds: Normal heart sounds. No murmur heard. Pulmonary:     Effort: Pulmonary effort is normal.     Breath sounds: Normal breath sounds. No wheezing or rales.  Chest:     Chest wall: No tenderness.  Abdominal:     General: Bowel sounds are normal. There is no distension.     Palpations: Abdomen is soft. There is no mass.     Tenderness: There is no abdominal tenderness.  Musculoskeletal:        General: Normal range of motion.     Right lower leg: No edema.     Left lower leg: No edema.  Neurological:     Mental Status: He is alert and oriented to person, place, and time.  Psychiatric:        Mood and Affect: Mood normal.   Diabetic Foot Exam - Simple   Simple Foot Form Visual Inspection See comments: Yes Sensation Testing Intact to touch and monofilament testing bilaterally: Yes Pulse Check See comments: Yes Comments Dystrophic toenails x10 bilaterally No ulcerations or skin breakdown Unable to palpate posterior tibialis and dorsalis pedis bilaterally (?  Underlying edema)      CMP Latest Ref Rng & Units 02/07/2021 01/31/2021 01/30/2021  Glucose 70 - 99 mg/dL 176(H) 145(H) 146(H)  BUN 6 - 20 mg/dL 21(H) 15 15  Creatinine 0.61 - 1.24 mg/dL 1.76(H) 1.39(H) 1.32(H)  Sodium 135 - 145 mmol/L 137 140 140  Potassium 3.5 - 5.1 mmol/L 5.2(H) 3.9 3.4(L)  Chloride 98 - 111 mmol/L 102 102 101  CO2 22 - 32 mmol/L 25 29 29   Calcium 8.9 -  10.3 mg/dL 8.9 8.6(L) 8.6(L)  Total Protein 6.5 - 8.1 g/dL - - -  Total Bilirubin 0.3 - 1.2 mg/dL - - -  Alkaline Phos 38 - 126 U/L - - -  AST 15 - 41 U/L - - -  ALT 0 - 44 U/L - - -    Lipid Panel  No results found for: CHOL, TRIG, HDL, CHOLHDL, VLDL, LDLCALC, LDLDIRECT  CBC    Component Value Date/Time   WBC 9.0 01/27/2021 0153   RBC 5.53 01/27/2021 0153   HGB 16.4 01/27/2021 0153   HCT 49.8 01/27/2021 0153  PLT 215 01/27/2021 0153   MCV 90.1 01/27/2021 0153   MCH 29.7 01/27/2021 0153   MCHC 32.9 01/27/2021 0153   RDW 13.2 01/27/2021 0153   LYMPHSABS 1.5 01/26/2021 1655   MONOABS 0.6 01/26/2021 1655   EOSABS 0.1 01/26/2021 1655   BASOSABS 0.1 01/26/2021 1655    Lab Results  Component Value Date   HGBA1C 14.4 (H) 01/27/2021    Assessment & Plan:  1. Type 2 diabetes mellitus with hyperglycemia, with long-term current use of insulin (HCC) Uncontrolled with A1c of 14.4; goal is less than 7.0 Blood sugar today is 149 No regimen change today Counseled on Diabetic diet, my plate method, [id number] minutes of moderate intensity exercise/week Blood sugar logs with fasting goals of 80-120 mg/dl, random of less than 180 and in the event of sugars less than 60 mg/dl or greater than 400 mg/dl encouraged to notify the clinic. Advised on the need for annual eye exams, annual foot exams, Pneumonia vaccine. - POCT glucose (manual entry) - insulin glargine (LANTUS) 100 UNIT/ML Solostar Pen; Inject 20 Units into the skin daily  Dispense: 6 mL; Refill: 0 - empagliflozin (JARDIANCE) 10 MG TABS tablet; Take 1 tablet (10 mg total) by mouth daily.  Dispense: 90 tablet; Refill: 1 - metFORMIN (GLUCOPHAGE) 1000 MG tablet; Take 1 tablet (1,000 mg total) by mouth 2 (two) times daily with a meal.  Dispense: 180 tablet; Refill: 1  2. Need for hepatitis C screening test - HCV Ab w Reflex to Quant PCR  3. Acute on chronic combined systolic and diastolic CHF (congestive heart failure) (HCC) EF of less  than 20% Euvolemic - Basic Metabolic Panel - atorvastatin (LIPITOR) 40 MG tablet; Take 1 tablet (40 mg total) by mouth daily.  Dispense: 90 tablet; Refill: 1 - carvedilol (COREG) 6.25 MG tablet; Take 1 tablet (6.25 mg total) by mouth 2 (two) times daily with a meal.  Dispense: 180 tablet; Refill: 1 - spironolactone (ALDACTONE) 25 MG tablet; Take 1 tablet (25 mg total) by mouth daily.  Dispense: 90 tablet; Refill: 1  4. Dystrophic nail - Ambulatory referral to Podiatry  5. Morbid obesity ([place]) Counseled on reducing portion sizes, increasing physical activity  6. Lightheadedness Advised to obtain a blood pressure monitor and keep a check of blood pressure especially when he is lightheaded Symptoms are negative for vertigo Blood pressure is normal   Meds ordered this encounter  Medications   insulin glargine (LANTUS) 100 UNIT/ML Solostar Pen    Sig: Inject 20 Units into the skin daily    Dispense:  6 mL    Refill:  0   atorvastatin (LIPITOR) 40 MG tablet    Sig: Take 1 tablet (40 mg total) by mouth daily.    Dispense:  90 tablet    Refill:  1   carvedilol (COREG) 6.25 MG tablet    Sig: Take 1 tablet (6.25 mg total) by mouth 2 (two) times daily with a meal.    Dispense:  180 tablet    Refill:  1   empagliflozin (JARDIANCE) 10 MG TABS tablet    Sig: Take 1 tablet (10 mg total) by mouth daily.    Dispense:  90 tablet    Refill:  1   gabapentin (NEURONTIN) 300 MG capsule    Sig: Take 1 capsule (300 mg total) by mouth 3 (three) times daily.    Dispense:  90 capsule    Refill:  1   metFORMIN (GLUCOPHAGE) 1000 MG tablet    Sig: Take  1 tablet (1,000 mg total) by mouth 2 (two) times daily with a meal.    Dispense:  180 tablet    Refill:  1   spironolactone (ALDACTONE) 25 MG tablet    Sig: Take 1 tablet (25 mg total) by mouth daily.    Dispense:  90 tablet    Refill:  1    Follow-up: Return in about 3 months (around 05/18/2021) for Medical conditions.       Hoy Register,  MD, FAAFP. [facility] [place], [place] [phone]   02/15/2021, 9:36 AM

## 2021-02-15 NOTE — Progress Notes (Signed)
Has dizziness when taking medications.

## 2021-02-16 ENCOUNTER — Telehealth: Payer: Self-pay

## 2021-02-16 ENCOUNTER — Other Ambulatory Visit (HOSPITAL_COMMUNITY): Payer: Self-pay

## 2021-02-16 ENCOUNTER — Other Ambulatory Visit: Payer: Self-pay

## 2021-02-16 ENCOUNTER — Other Ambulatory Visit: Payer: Self-pay | Admitting: Family Medicine

## 2021-02-16 DIAGNOSIS — E875 Hyperkalemia: Secondary | ICD-10-CM

## 2021-02-16 LAB — BASIC METABOLIC PANEL
BUN/Creatinine Ratio: 16 (ref 9–20)
BUN: 34 mg/dL — ABNORMAL HIGH (ref 6–24)
CO2: 24 mmol/L (ref 20–29)
Calcium: 9.7 mg/dL (ref 8.7–10.2)
Chloride: 99 mmol/L (ref 96–106)
Creatinine, Ser: 2.1 mg/dL — ABNORMAL HIGH (ref 0.76–1.27)
Glucose: 153 mg/dL — ABNORMAL HIGH (ref 70–99)
Potassium: 5.4 mmol/L — ABNORMAL HIGH (ref 3.5–5.2)
Sodium: 138 mmol/L (ref 134–144)
eGFR: 38 mL/min/{1.73_m2} — ABNORMAL LOW (ref >59)

## 2021-02-16 LAB — HCV AB W REFLEX TO QUANT PCR: HCV Ab: <0.1 s/co ratio (ref 0.0–0.9)

## 2021-02-16 LAB — HCV INTERPRETATION

## 2021-02-16 MED ORDER — POTASSIUM CHLORIDE CRYS ER 20 MEQ PO TBCR
20.0000 meq | EXTENDED_RELEASE_TABLET | Freq: Every day | ORAL | 1 refills | Status: DC
  Filled 2021-02-16 – 2021-04-23 (×2): qty 30, 30d supply, fill #0

## 2021-02-16 NOTE — Telephone Encounter (Signed)
-----   Message from Hoy Register, MD sent at 02/16/2021  8:50 AM EDT ----- Please advise him potassium level is elevated and he needs to decrease his potassium from 20 meq twice daily to 20 mEq once daily and I have ordered follow-up potassium for him for next week.  Kidney function is also abnormal and this could also be due to one of his heart medications.  Please advise him to follow-up with his cardiologist regarding any medication change.

## 2021-02-16 NOTE — Telephone Encounter (Signed)
Pt was called and phone is giving a busy signal.

## 2021-02-17 ENCOUNTER — Encounter (HOSPITAL_COMMUNITY): Payer: Self-pay | Admitting: *Deleted

## 2021-02-22 NOTE — Telephone Encounter (Signed)
Patient returned call- advised per PCP note. Patient to decrease potassium dosing and lab has been scheduled for recheck- 02/28/21. Patient states he just had appointment with cardiology and reports no changes in medications. Patient states his cardiologist does have access to labs.

## 2021-02-28 ENCOUNTER — Ambulatory Visit: Payer: Self-pay | Attending: Family Medicine

## 2021-02-28 ENCOUNTER — Other Ambulatory Visit: Payer: Self-pay

## 2021-02-28 DIAGNOSIS — E875 Hyperkalemia: Secondary | ICD-10-CM

## 2021-03-01 ENCOUNTER — Telehealth: Payer: Self-pay

## 2021-03-01 LAB — BASIC METABOLIC PANEL
BUN/Creatinine Ratio: 15 (ref 9–20)
BUN: 22 mg/dL (ref 6–24)
CO2: 25 mmol/L (ref 20–29)
Calcium: 9.2 mg/dL (ref 8.7–10.2)
Chloride: 102 mmol/L (ref 96–106)
Creatinine, Ser: 1.48 mg/dL — ABNORMAL HIGH (ref 0.76–1.27)
Glucose: 127 mg/dL — ABNORMAL HIGH (ref 70–99)
Potassium: 4.5 mmol/L (ref 3.5–5.2)
Sodium: 147 mmol/L — ABNORMAL HIGH (ref 134–144)
eGFR: 58 mL/min/{1.73_m2} — ABNORMAL LOW (ref >59)

## 2021-03-01 NOTE — Telephone Encounter (Signed)
-----   Message from Hoy Register, MD sent at 03/01/2021  8:27 AM EST ----- Please inform him that his kidney function is slightly abnormal but has improved significantly compared to 2 weeks ago.  Potassium is also normal.  Nothing additional needs to be done at this time

## 2021-03-01 NOTE — Telephone Encounter (Signed)
Patient was called and a voicemail was left informing patient to return phone call for lab results.   CRM created. 

## 2021-03-02 ENCOUNTER — Telehealth: Payer: Self-pay

## 2021-03-02 NOTE — Telephone Encounter (Signed)
-----   Message from Hoy Register, MD sent at 03/01/2021  8:27 AM EST ----- Please inform him that his kidney function is slightly abnormal but has improved significantly compared to 2 weeks ago.  Potassium is also normal.  Nothing additional needs to be done at this time

## 2021-03-02 NOTE — Telephone Encounter (Signed)
Patient was called and a voicemail was left informing patient to return phone call for lab results. 

## 2021-03-07 ENCOUNTER — Ambulatory Visit: Payer: Self-pay | Admitting: Podiatry

## 2021-03-07 NOTE — Telephone Encounter (Signed)
Pt given lab results per notes of Dr. Alvis Lemmings on 03/01/21. Pt verbalized understanding.

## 2021-03-14 ENCOUNTER — Other Ambulatory Visit: Payer: Self-pay

## 2021-03-15 ENCOUNTER — Other Ambulatory Visit: Payer: Self-pay

## 2021-03-15 MED ORDER — FUROSEMIDE 40 MG PO TABS
ORAL_TABLET | ORAL | 11 refills | Status: DC
  Filled 2021-03-15: qty 30, 30d supply, fill #0
  Filled 2021-04-12: qty 30, 30d supply, fill #1
  Filled 2021-04-23: qty 30, 30d supply, fill #0

## 2021-04-04 ENCOUNTER — Ambulatory Visit (HOSPITAL_COMMUNITY): Payer: Self-pay

## 2021-04-08 ENCOUNTER — Other Ambulatory Visit: Payer: Self-pay

## 2021-04-12 ENCOUNTER — Other Ambulatory Visit: Payer: Self-pay

## 2021-04-12 ENCOUNTER — Other Ambulatory Visit: Payer: Self-pay | Admitting: Family Medicine

## 2021-04-12 DIAGNOSIS — Z794 Long term (current) use of insulin: Secondary | ICD-10-CM

## 2021-04-12 MED ORDER — LANTUS SOLOSTAR 100 UNIT/ML ~~LOC~~ SOPN
20.0000 [IU] | PEN_INJECTOR | Freq: Every day | SUBCUTANEOUS | 0 refills | Status: DC
  Filled 2021-04-12 – 2021-04-23 (×2): qty 6, 30d supply, fill #0

## 2021-04-13 ENCOUNTER — Ambulatory Visit (HOSPITAL_COMMUNITY): Payer: Self-pay

## 2021-04-15 ENCOUNTER — Other Ambulatory Visit: Payer: Self-pay

## 2021-04-23 ENCOUNTER — Other Ambulatory Visit: Payer: Self-pay

## 2021-04-25 ENCOUNTER — Other Ambulatory Visit: Payer: Self-pay

## 2021-04-25 ENCOUNTER — Telehealth (HOSPITAL_COMMUNITY): Payer: Self-pay

## 2021-04-25 ENCOUNTER — Encounter (HOSPITAL_COMMUNITY): Payer: Self-pay

## 2021-04-25 ENCOUNTER — Ambulatory Visit (HOSPITAL_COMMUNITY)
Admission: RE | Admit: 2021-04-25 | Discharge: 2021-04-25 | Disposition: A | Discharge status: Home / Self Care | Payer: Self-pay | Source: Ambulatory Visit | Attending: Adult Health | Admitting: Adult Health

## 2021-04-25 VITALS — BP 180/100 | HR 103 | Wt 291.6 lb

## 2021-04-25 DIAGNOSIS — I2510 Atherosclerotic heart disease of native coronary artery without angina pectoris: Secondary | ICD-10-CM | POA: Insufficient documentation

## 2021-04-25 DIAGNOSIS — I10 Essential (primary) hypertension: Secondary | ICD-10-CM

## 2021-04-25 DIAGNOSIS — Z79899 Other long term (current) drug therapy: Secondary | ICD-10-CM | POA: Insufficient documentation

## 2021-04-25 DIAGNOSIS — I5023 Acute on chronic systolic (congestive) heart failure: Secondary | ICD-10-CM | POA: Insufficient documentation

## 2021-04-25 DIAGNOSIS — Z7984 Long term (current) use of oral hypoglycemic drugs: Secondary | ICD-10-CM | POA: Insufficient documentation

## 2021-04-25 DIAGNOSIS — I5082 Biventricular heart failure: Secondary | ICD-10-CM | POA: Insufficient documentation

## 2021-04-25 DIAGNOSIS — E1165 Type 2 diabetes mellitus with hyperglycemia: Secondary | ICD-10-CM | POA: Insufficient documentation

## 2021-04-25 DIAGNOSIS — E785 Hyperlipidemia, unspecified: Secondary | ICD-10-CM | POA: Insufficient documentation

## 2021-04-25 DIAGNOSIS — R0602 Shortness of breath: Secondary | ICD-10-CM | POA: Insufficient documentation

## 2021-04-25 DIAGNOSIS — Z6841 Body Mass Index (BMI) 40.0 and over, adult: Secondary | ICD-10-CM | POA: Insufficient documentation

## 2021-04-25 DIAGNOSIS — Z794 Long term (current) use of insulin: Secondary | ICD-10-CM | POA: Insufficient documentation

## 2021-04-25 DIAGNOSIS — M7989 Other specified soft tissue disorders: Secondary | ICD-10-CM | POA: Insufficient documentation

## 2021-04-25 DIAGNOSIS — E669 Obesity, unspecified: Secondary | ICD-10-CM | POA: Insufficient documentation

## 2021-04-25 DIAGNOSIS — I110 Hypertensive heart disease with heart failure: Secondary | ICD-10-CM | POA: Insufficient documentation

## 2021-04-25 LAB — BASIC METABOLIC PANEL
Anion gap: 8 (ref 5–15)
BUN: 13 mg/dL (ref 6–20)
CO2: 24 mmol/L (ref 22–32)
Calcium: 8.9 mg/dL (ref 8.9–10.3)
Chloride: 104 mmol/L (ref 98–111)
Creatinine, Ser: 1.07 mg/dL (ref 0.61–1.24)
GFR, Estimated: >60 mL/min (ref >60)
Glucose, Bld: 203 mg/dL — ABNORMAL HIGH (ref 70–99)
Potassium: 3.8 mmol/L (ref 3.5–5.1)
Sodium: 136 mmol/L (ref 135–145)

## 2021-04-25 NOTE — Patient Instructions (Addendum)
Good to see you today  Labs done today, your results will be available in MyChart, we will contact you for abnormal readings.  Your physician recommends that you schedule a follow-up appointment in: 4wks with Dr. Gala Romney on 05/31/2021 @9 :20 am  Please follow up with our heart failure pharmacist in in 2 wks 05/11/2021  If you have any questions or concerns before your next appointment please send 05/13/2021 a message through [place] or call our office at [phone].    TO LEAVE A MESSAGE FOR THE NURSE SELECT OPTION 2, PLEASE LEAVE A MESSAGE INCLUDING: YOUR NAME DATE OF BIRTH CALL BACK NUMBER REASON FOR CALL**this is important as we prioritize the call backs  YOU WILL RECEIVE A CALL BACK THE SAME DAY AS LONG AS YOU CALL BEFORE 4:00 PM   At the [facility], you and your health needs are our priority. As part of our continuing mission to provide you with exceptional heart care, we have created designated Provider Care Teams. These Care Teams include your primary Cardiologist (physician) and Advanced Practice Providers (APPs- Physician Assistants and Nurse Practitioners) who all work together to provide you with the care you need, when you need it.   You may see any of the following providers on your designated Care Team at your next follow up: Dr [phone] Dr Arvilla Meres, NP Carron Curie, Robbie Lis [facility] [place], [place], PharmD   Please be sure to bring in all your medications bottles to every appointment.   Do the following things EVERYDAY: Weigh yourself in the morning before breakfast. Write it down and keep it in a log. Take your medicines as prescribed Eat low salt foods--Limit salt (sodium) to 2000 mg per day.  Stay as active as you can everyday Limit all fluids for the day to less than 2 liters

## 2021-04-25 NOTE — Progress Notes (Signed)
HEART IMPACT TRANSITIONS OF CARE    PCP: Dr Margarita Rana  Primary Cardiologist: Dr Terrence Dupont HF MD: Dr Haroldine Laws  HPI: [name] is a 51 y/o male w/ chronic biventricular systolic heart failure, HTN, poorly controlled T2DM, HLD and obesity. Previously followed by cardiology at [facility]. Per Pt report, he thinks he was diagnosed w/ CHF ~2015 though no records are available during that time. He recalls being diagnosed w/ CHF shortly after a bout w/ a viral illness, which he believes was flu. Per records in [place], he had an Echo in 2018 w/ EF 40-45%. NST showed no ischemia. CM felt likely NICM from uncontrolled HTN, per notes from [facility]. He has never had a LHC. Uncertain if he has had a cMRI.    Recently admitted to [facility] for a/c CHF and seen by Dr. Terrence Dupont. Echo w/ severe biventricular dysfunction. LVEF down to <20%, RV severely reduced w/ global HK, GIIDD, no LVH, moderately elevated RVSP 52 mmHg w/ mod TR. Only mild MR. He was diuresed w/ IV Lasix and placed on GDMT. On Jardiance, Entresto, Spiro and Coreg. Discharge wt 297 lb. Of note, his DM is poorly controlled, recent Hgb A1c 14.    He was seen in the HF TOC in October. Entresto was increased 97-103 mg twice a day and lasix was cut back to 40 mg daily.   Overall feeling ok. He is having some shortness of breath and swelling because he has been off his medications for 2 weeks.  Denies /PND/Orthopnea. Appetite ok. No fever or chills. Weight at home 275-282   pounds. He has been out of all meds for 2 weeks except for entresto. He called and left voice mails at his pharmacy but just heard today that medications have been filled. Food stamps had been on hold.  He is uninsured and does not work. Marland Kitchen He lives with his brother and nephew . He completed phone application for disability with the [facility]. He plays 10 instruments.    Family h/o is notable for CHF, CAD and Afib (father).  He denies any personal h/o ETOH use. No tobacco use.      Cardiac Testing    Care Everywhere ([facility])--> Echo 2018 EF 40-45% Echo 01/27/21 EF < 20% Grade II DD RV severely reduced  Care Everywhere [facility]) Myoview 2018>> No ischemia     Echo 01/27/21 EF < 20% Grade II DD RV severely reduced  ROS: All systems negative except as listed in HPI, PMH and Problem List.  SH:  Social History   Socioeconomic History   Marital status: Single    Spouse name: Not on file   Number of children: Not on file   Years of education: Not on file   Highest education level: Not on file  Occupational History   Not on file  Tobacco Use   Smoking status: Never   Smokeless tobacco: Never  Substance and Sexual Activity   Alcohol use: Never   Drug use: Never   Sexual activity: Not on file  Other Topics Concern   Not on file  Social History Narrative   Not on file   Social Determinants of Health   Financial Resource Strain: High Risk   Difficulty of Paying Living Expenses: Very hard  Food Insecurity: Food Insecurity Present   Worried About Running Out of Food in the Last Year: Sometimes true   Ran Out of Food in the Last Year: Sometimes true  Transportation Needs: No Transportation Needs  Lack of Transportation (Medical): No   Lack of Transportation (Non-Medical): No  Physical Activity: Not on file  Stress: Not on file  Social Connections: Not on file  Intimate Partner Violence: Not on file    FH:  Family History  Problem Relation Age of Onset   Atrial fibrillation Neg Hx     Past Medical History:  Diagnosis Date   CHF (congestive heart failure) (HCC)    Diabetes mellitus without complication (HCC)    Hypertension     Current Outpatient Medications  Medication Sig Dispense Refill   acetaminophen (TYLENOL) 325 MG tablet Take 650 mg by mouth as needed.     aspirin 81 MG EC tablet Take 1 tablet (81 mg total) by mouth daily. 90 tablet 0   atorvastatin (LIPITOR) 40 MG tablet Take 1 tablet (40 mg total) by mouth daily. 90 tablet 1   carvedilol  (COREG) 6.25 MG tablet Take 1 tablet (6.25 mg total) by mouth 2 (two) times daily with a meal. 180 tablet 1   empagliflozin (JARDIANCE) 10 MG TABS tablet Take 1 tablet (10 mg total) by mouth daily. 90 tablet 1   furosemide (LASIX) 40 MG tablet Take 1 tablet (40 mg total) by mouth daily. 30 tablet 11   gabapentin (NEURONTIN) 300 MG capsule Take 1 capsule (300 mg total) by mouth 3 (three) times daily. 90 capsule 1   insulin glargine (LANTUS SOLOSTAR) 100 UNIT/ML Solostar Pen Inject 20 Units into the skin daily 6 mL 0   Insulin Pen Needle 32G X 4 MM MISC Use to inject insulin as directed. 100 each 0   metFORMIN (GLUCOPHAGE) 1000 MG tablet Take 1 tablet (1,000 mg total) by mouth 2 (two) times daily with a meal. 180 tablet 1   Multiple Vitamins-Minerals (CENTRUM MEN) TABS Take 1 tablet by mouth daily.     mupirocin ointment (BACTROBAN) 2 % Apply topically daily. Cleanse right great toe wound with saline and pat dry. Apply mupirocin ointment to open nail bed.  Cover with dry dressing and tape. Change daily. 30 g 1   potassium chloride SA (KLOR-CON M) 20 MEQ tablet Take 1 tablet (20 mEq total) by mouth daily. 30 tablet 1   sacubitril-valsartan (ENTRESTO) 97-103 MG Take 1 tablet by mouth 2 (two) times daily. 60 tablet 11   spironolactone (ALDACTONE) 25 MG tablet Take 1 tablet (25 mg total) by mouth daily. 90 tablet 1   No current facility-administered medications for this encounter.    Vitals:   04/25/21 1114  BP: (!) 180/100  Pulse: (!) 103  SpO2: 100%  Weight: 132.3 kg (291 lb 9.6 oz)   Wt Readings from Last 3 Encounters:  04/25/21 132.3 kg (291 lb 9.6 oz)  02/15/21 128.4 kg (283 lb)  02/07/21 130.2 kg (287 lb)    PHYSICAL EXAM: General:  Walked in the clinic.  No resp difficulty HEENT: normal Neck: supple. JVP 8-9 . Carotids 2+ bilaterally; no bruits. No lymphadenopathy or thryomegaly appreciated. Cor: PMI normal. TachyRegular rate & rhythm. No rubs, gallops or murmurs. Lungs:  clear Abdomen: soft, nontender, nondistended. No hepatosplenomegaly. No bruits or masses. Good bowel sounds. Extremities: no cyanosis, clubbing, rash, edema Neuro: alert & orientedx3, cranial nerves grossly intact. Moves all 4 extremities w/o difficulty. Affect pleasant.   ECG: Sinus  Tach 103 bpm QRS 132 ms    ASSESSMENT & PLAN: 1. Chronic Biventricular Systolic Heart Failure   - presumed NICM, likely hypertensive CM but cannot r/o viral CM  - Echo 2018 (  [facility]) EF 40-45%, NST low risk. Has never had LHC nor cMRI  - Echo 10/22 EF <20%, RV severely reduced . Plan to repeat ECHO in 3 months after HF meds optimized.  - NYHA III. He has been out of all medications x 2 weeks. All meds ready today at that pharmacy.   - No med changes today. Restarting them today.  - Check BMET. May need dig but will hold off until next visit.  - Continue Jardiance 10 mg  - Continue  Entresto to 97-103 mg bid - Continue Lasix to 40 mg once daily  - Continue Spironolactone 25 mg daily  - Continues Coreg 6.25 mg bid - Also consider definitive LHC to r/o CAD given multiple CFRs, cMRI  + sleep study to r/o OSA.  - Follow up pharmacy in 2 weeks and Dr Haroldine Laws in 4 weeks.   - Plan to repeat ECHO in 2 months.    2. Hypertension  -Uncontrolled but as above restarting meds.    3. Type 2DM  - poorly controlled, recent A1c 14 - on Jardiance + Insulin - per PCP . Established with Dr Evie Lacks.    4. Obesity  - Body mass index is 41.84 kg/m. - Discussed portion control   5. SDOH  - He was seen by HFSW 02/07/21  -CSW completed an application for [facility] for disability and food stamp applications. Today Kennyth Lose met with him and to f/u for food stamps.   Refer to HF Paramedicine for medication management. Suspect he will not need this long term but would like to provide as much support as we can until he has insurance and medication issues worked.   Follow up  2 weeks with pharmacy and 4 weeks with Dr  Haroldine Laws.   Terry Young 11:56 AM

## 2021-04-25 NOTE — Telephone Encounter (Signed)
Call attempted to confirm HV TOC appt today at 11AM. HIPPA appropriate VM left with callback number.   Draxton Luu, MSN, RN Heart Failure Nurse Navigator [phone]  

## 2021-04-25 NOTE — Progress Notes (Signed)
Heart and Vascular Care Navigation  04/25/2021  Terry Young 1971-01-10 [id number]  Reason for Referral: Patient seen in HF [facility]   Engaged with patient face to face for follow up visit for Heart and Vascular Care Coordination.                                                                                                   Assessment: Patient returned to [facility] clinic for follow up visit. He states that there are no changes in his family or home life since last visit. He was referred to the [facility] for a food stamp application and disability. He reports that he was told by [facility] that food stamp applications are on hold so he has not followed up with an application. Patient does confirm that he has a pending disability application with the [facility].  Patient reports that he has been out of medications due to difficulties with payment and refills at the [facility] pharmacy.                                   HRT/VAS Care Coordination     Patients Home Cardiology Office --  HF [facility]   Outpatient Care Team Social Worker   Social Worker Name: Lasandra Beech, [place] [phone]   Living arrangements for the past 2 months Apartment   Lives with: Siblings; Relatives  Brother and nephew   Patient Current Insurance Coverage Self-Pay   Patient Has Concern With Paying Medical Bills Yes   Medical Bill Referrals: Financial Counseling   Does Patient Have Prescription Coverage? No   Home Assistive Devices/Equipment None   DME Agency NA       Social History:                                                                             SDOH Screenings   Alcohol Screen: Not on file  Depression (PHQ2-9): Low Risk    PHQ-2 Score: 4  Financial Resource Strain: High Risk   Difficulty of Paying Living Expenses: Very hard  Food Insecurity: Food Insecurity Present   Worried About Programme researcher, broadcasting/film/video in the Last Year: Sometimes true   Barista in the Last Year: Sometimes true  Housing: Low  Risk    Last Housing Risk Score: 0  Physical Activity: Not on file  Social Connections: Not on file  Stress: Not on file  Tobacco Use: Low Risk    Smoking Tobacco Use: Never   Smokeless Tobacco Use: Never   Passive Exposure: Not on file  Transportation Needs: No Transportation Needs   Lack of Transportation (Medical): No   Lack of Transportation (Non-Medical): No    SDOH Interventions: Financial Resources:  Surveyor, quantity  Strain Interventions: Other (Comment) (Pending Disability) Assisted with financial support for medications at [facility]  Food Insecurity:  Food Insecurity Interventions: Assist with [employer] Application  Housing Insecurity:   N/a  Transportation:    N/a    Follow-up plan:  Patient will be referred to [employer] for added support and assistance with financial barriers. CSW assisted with financial assistance for medications at the [facility] pharmacy and patient verbalizes understanding of medication adherence. Lasandra Beech, LCSW, CCSW-MCS [phone]

## 2021-04-28 ENCOUNTER — Telehealth (HOSPITAL_COMMUNITY): Payer: Self-pay | Admitting: Licensed Clinical Social Worker

## 2021-04-28 NOTE — Telephone Encounter (Signed)
CSW consulted to add pt to paramedicine program.  Below questions reviewed with pt in person on 04/27/21.  Paramedicine Initial Assessment:  Housing:  In what kind of housing do you live? House/apt/trailer/shelter? apartment  Do you rent/pay a mortgage/own? rent  Do you live with anyone? Nephew and brother  Are you currently worried about losing your housing? no  Food:  Within the past 12 months were you ever worried that food would run out before you got money to buy more? no  Within the past 49months have you run out of food and didn't have money to buy more? no  [employer]  Income:  What is your current source of income? None- disability case pending  Insurance:  Are you currently insured? no  Do you have prescription coverage? No working with [employer]:  Do you have transportation to your medical appointments? Yes gets ride from nephew   Daily Health Needs: How do you manage your medications at home? Takes them out of the bottle  Do you ever take your medications differently than prescribed? No intentionally  Do you have issues affording your medications? yes  If yes, has this ever prevented you from obtaining medications? Yes  Do you have a PCP? Dr. Margarita Rana  Are there any additional barriers you see to getting the care you need? Not at this time  CSW will continue to follow through paramedicine program and assist as needed.  Jorge Ny, LCSW Clinical Social Worker [facility] Desk#: [phone] Cell#: [phone]

## 2021-05-03 ENCOUNTER — Telehealth (HOSPITAL_COMMUNITY): Payer: Self-pay

## 2021-05-03 NOTE — Telephone Encounter (Signed)
Left message for Terry Young to return my call in reference to setting up paramedicine visits. I will continue to reach out.

## 2021-05-04 ENCOUNTER — Other Ambulatory Visit (HOSPITAL_COMMUNITY): Payer: Self-pay

## 2021-05-04 ENCOUNTER — Telehealth (HOSPITAL_COMMUNITY): Payer: Self-pay | Admitting: Licensed Clinical Social Worker

## 2021-05-04 ENCOUNTER — Telehealth (HOSPITAL_COMMUNITY): Payer: Self-pay | Admitting: Surgery

## 2021-05-04 MED ORDER — ENTRESTO 97-103 MG PO TABS: 1.0000 | ORAL_TABLET | Freq: Two times a day (BID) | ORAL | 3 refills | Status: DC

## 2021-05-04 NOTE — Telephone Encounter (Signed)
I attempted to reach [name] about a message he left with clinic LCSW regarding his Sherryll Burger and a possible refill.  Patient did not answer the phone and there was no ability to leave a message.

## 2021-05-04 NOTE — Telephone Encounter (Signed)
CSW informed pt almost out of Entresto and can't reorder through [employer] [facility].  Spoke with pt who confirms he has been taking double his dose as his prescription strength changed and this was never fixed with [employer] so he is almost out and can't reorder.  CSW spoke with [employer] and confirmed we need to send in new prescriber portion as we have taken over management of this medication from his other Cardiologist Dr. Sharyn Lull.  CSW completed prescriber portion and had reviewed an signed by physician- sent this and new script in to [facility] for processing.  Pt has maybe 2-3 days left- offered for him to pick up samples from clinic- pt will call CSW tomorrow when he knows when he can come  Will continue to follow and assist as needed  Burna Sis, LCSW Clinical Social Worker [facility] Desk#: [phone] Cell#: [phone]

## 2021-05-05 ENCOUNTER — Telehealth (HOSPITAL_COMMUNITY): Payer: Self-pay | Admitting: Licensed Clinical Social Worker

## 2021-05-05 ENCOUNTER — Telehealth (HOSPITAL_COMMUNITY): Payer: Self-pay | Admitting: Vascular Surgery

## 2021-05-05 NOTE — Telephone Encounter (Signed)
Left pt VM with the time change for pharmacy appt 1/25 from 4 pm to 12 noon asked pt to call back to confirm appt change

## 2021-05-05 NOTE — Telephone Encounter (Signed)
Pt called CSW to confirm he could come pick up samples of Entresto today while we await [employer] getting his dose changed in their system and sent out to patient.  CSW providing 1 bottle of entresto for pt to hold him over until new script can be filled at [facility]  Lot: [id number] Exp 11/24  Jorge Ny, [place] [facility] Desk#: [phone] Cell#: [phone]

## 2021-05-06 NOTE — Progress Notes (Incomplete)
***In Progress***    Advanced Heart Failure Clinic Note   PCP: Dr Margarita Rana  Primary Cardiologist: Dr Terrence Dupont HF MD: Dr Haroldine Laws  HPI:  Mr Rimel is a 51 y/o male w/ chronic biventricular systolic heart failure, HTN, poorly controlled T2DM, HLD and obesity. Previously followed by cardiology at [facility]. Per patient report, he thinks he was diagnosed w/ CHF ~2015 though no records are available during that time. He recalls being diagnosed with CHF shortly after a bout with a viral illness, which he believes was flu. Per records in [place], he had an Echo in 2018 w/ EF 40-45%. NST showed no ischemia. CM felt likely to be NICM from uncontrolled HTN, per notes from [facility]. He has never had a LHC. Uncertain if he has had a cMRI.    Recently admitted to [facility] for A/C CHF and seen by Dr. Terrence Dupont. Echo w/ severe biventricular dysfunction. LVEF down to <20%, RV severely reduced w/ global HK, GIIDD, no LVH, moderately elevated RVSP 52 mmHg w/ mod TR. Only mild MR. He was diuresed w/ IV Lasix and placed on GDMT. On Jardiance, Entresto, spironolactone and carvedilol. Discharge weight was 297 lbs. Of note, his DM is poorly controlled, recent Hgb A1c 14%.    He was seen in the HF TOC in October. Entresto was increased 97-103 mg twice a day and Lasix was cut back to 40 mg daily.    Presented to AHF Clinic on 04/25/21. Overall he was feeling ok. He reported some shortness of breath and swelling because he had been off his medications for 2 weeks.  Denied PND/Orthopnea. Appetite was ok. No fever or chills. Weight at home was 275-282 pounds. He had been out of all medications for 2 weeks except for Entresto. He called and left voice mails at his pharmacy but just heard today that medications have been filled. Food stamps had been on hold.  He is uninsured and does not work. He lives with his brother and nephew . He completed phone application for disability with the [facility]. He plays 10 instruments.   Today he  returns to HF clinic for pharmacist medication titration. At last visit with APP, his heart failure medications were all restarted as he had been out of them all except Entresto.   Shortness of breath/dyspnea on exertion? {YES NO:22349}  Orthopnea/PND? {YES NO:22349} Edema? {YES NO:22349} Lightheadedness/dizziness? {YES NO:22349} Daily weights at home? {YES NO:22349} Blood pressure/heart rate monitoring at home? {YES P5382123 Following low-sodium/fluid-restricted diet? {YES NO:22349}  HF Medications: Carvedilol 6.25 mg BID Entresto 97/103 mg BID Spironolactone 25 mg daily Jardiance 10 mg daily Lasix 40 mg daily Potassium chloride 20 mEq daily    Has the patient been experiencing any side effects to the medications prescribed?  {YES NO:22349}  Does the patient have any problems obtaining medications due to transportation or finances?   {YES NO:22349}  Understanding of regimen: {excellent/good/fair/poor:19665} Understanding of indications: {excellent/good/fair/poor:19665} Potential of compliance: {excellent/good/fair/poor:19665} Patient understands to avoid NSAIDs. Patient understands to avoid decongestants.    Pertinent Lab Values: Serum creatinine ***, BUN ***, Potassium ***, Sodium ***, BNP ***, Magnesium ***, Digoxin ***   Vital Signs: Weight: *** (last clinic weight: ***) Blood pressure: ***  Heart rate: ***   Assessment/Plan: 1. Chronic Biventricular Systolic Heart Failure   - presumed NICM, likely hypertensive CM but cannot r/o viral CM  - Echo 2018 ([facility]) EF 40-45%, NST low risk. Has never had LHC nor cMRI  - Echo 01/2021 EF <20%, RV severely reduced .  Plan to repeat ECHO in 3 months after HF meds optimized.  - NYHA III. Volume status *** - Continue Lasix 40 mg daily - Continue carvedilol 6.25 mg BID - Continue  Entresto to 97-103 mg BID - Continue spironolactone 25 mg daily  - Continue Jardiance 10 mg daily - Also consider definitive LHC to r/o CAD given  multiple CFRs, cMRI  + sleep study to r/o OSA.  - Plan to repeat ECHO in 2 months.    2. Hypertension  -Uncontrolled but as above restarting meds. ***   3. Type 2DM  - poorly controlled, recent A1c 14 - on Jardiance + Insulin - per PCP . Established with Dr Evie Lacks.    4. Obesity  - Body mass index is 41.84 kg/m. - Discussed portion control   5. SDOH  - He was seen by HFSW 02/07/21  -CSW completed an application for [facility] for disability and food stamp applications. Today Kennyth Lose met with him and to f/u for food stamps.     Follow up ***   Audry Riles, PharmD, BCPS, BCCP, CPP Heart Failure Clinic Pharmacist [phone]

## 2021-05-10 ENCOUNTER — Telehealth (HOSPITAL_COMMUNITY): Payer: Self-pay

## 2021-05-10 NOTE — Telephone Encounter (Signed)
Called and left Terry Young a voicemail advising him of my absence tomorrow and reminding him of his pharmacy clinic visit at 12.   I will follow up for initial paramedicine meeting next week.

## 2021-05-11 ENCOUNTER — Inpatient Hospital Stay (HOSPITAL_COMMUNITY): Admission: RE | Admit: 2021-05-11 | Payer: Self-pay | Source: Ambulatory Visit

## 2021-05-11 ENCOUNTER — Telehealth (HOSPITAL_COMMUNITY): Payer: Self-pay | Admitting: Licensed Clinical Social Worker

## 2021-05-11 NOTE — Telephone Encounter (Signed)
CSW called pt to check in if he had heard about getting shipment of Entresto from [facility]- phone went straight to VM- left message requesting return call  Jorge Ny, [facility] Desk#: [phone] Cell#: [phone]

## 2021-05-11 NOTE — Progress Notes (Signed)
Advanced Heart Failure Clinic Note   PCP: Dr Alvis Lemmings  Primary Cardiologist: Dr Sharyn Lull HF MD: Dr Gala Romney  HPI:  Terry Young is a 51 y/o male w/ chronic biventricular systolic heart failure, HTN, poorly controlled T2DM, HLD and obesity. Previously followed by cardiology at [facility]. Per patient report, he thinks he was diagnosed w/ CHF ~2015 though no records are available during that time. He recalls being diagnosed with CHF shortly after a bout with a viral illness, which he believes was flu. Per records in Care Everywhere, he had an Echo in 2018 w/ EF 40-45%. NST showed no ischemia. CM felt likely to be NICM from uncontrolled HTN, per notes from [facility]. He has never had a LHC. Uncertain if he has had a cMRI.    Recently admitted to [facility] for A/C CHF and seen by Dr. Sharyn Lull. Echo w/ severe biventricular dysfunction. LVEF down to <20%, RV severely reduced w/ global HK, GIIDD, no LVH, moderately elevated RVSP 52 mmHg w/ mod TR. Only mild Terry. He was diuresed w/ IV Lasix and placed on GDMT. On Jardiance, Entresto, spironolactone and carvedilol. Discharge weight was 297 lbs. Of note, his DM is poorly controlled, recent Hgb A1c 14%.    He was seen in the HF TOC in October. Entresto was increased 97-103 mg twice a day and Lasix was cut back to 40 mg daily.    Presented to AHF Clinic on 04/25/21. Overall he was feeling ok. He reported some shortness of breath and swelling because he had been off his medications for 2 weeks.  Denied PND/Orthopnea. Appetite was ok. No fever or chills. Weight at home was 275-282 pounds. He had been out of all medications for 2 weeks except for Entresto. He called and left voice mails at his pharmacy but just heard today that medications have been filled. Food stamps had been on hold.  He is uninsured and does not work. He lives with his brother and nephew . He completed phone application for disability with the [facility]. He plays 10 instruments.   Today he returns to HF  clinic for pharmacist medication titration. At last visit with APP, his heart failure medications were all restarted as he had been out of them all except Entresto. Overall he is feeling well today. Was able to obtain all his medications and has been taking them as prescribed. Notes he only has one day left of Entresto. He receives [employer] from [facility], but in November when dose was increased in heart failure clinic, he was told to double up on the 49/51 mg strength to equal 97/103 mg. Unfortunately, he was already receiving the 97/103 mg strength from [facility] (prescribed by another provider) and was inadvertently taking double 97/103 mg strength twice daily. [employer] is processing his order currently but have provided him 1 bottle of Entresto samples until shipment arrives (Lot: [id number], Exp: 02/2023).   States he is feeling a lot better since all his medications were restarted. Has occaisional dizziness after taking Entresto but sits down for a few minutes and this resolves. No more chest tightness since fluid has improved. Weight down 8 lbs from last office visit. Main complaint is that he still feels like he has some extra fluid. Says his hands really hurt with the extra fluid. Trace edema in lower extremities. Says leg swelling increases throughout the day but usually improves the following morning. No PND/orthopnea.     HF Medications: Carvedilol 6.25 mg BID Entresto 97/103 mg BID Spironolactone 25 mg  daily Jardiance 10 mg daily Lasix 40 mg daily Potassium chloride 20 mEq daily    Has the patient been experiencing any side effects to the medications prescribed?  no  Does the patient have any problems obtaining medications due to transportation or finances?   No insurance. Approved for [employer] PAP for Entresto. Receives medications from [facility].   Understanding of regimen: good Understanding of indications: good Potential of compliance: good Patient understands to avoid NSAIDs. Patient  understands to avoid decongestants.    Pertinent Lab Values: 04/25/21: Serum creatinine 1.07, BUN 13, Potassium 3.8, Sodium 136  Vital Signs: Weight: 283 lbs (last clinic weight: 291.6 lbs) Blood pressure: 154/98  Heart rate: 88   Assessment/Plan: 1. Chronic Biventricular Systolic Heart Failure   - presumed NICM, likely hypertensive CM but cannot r/o viral CM  - Echo 2018 ([facility]) EF 40-45%, NST low risk. Has never had LHC nor cMRI  - Echo 01/2021 EF <20%, RV severely reduced . Plan to repeat ECHO in 3 months after HF meds optimized.  - NYHA II-III. Volume status is much improved though still slightly elevated. - BMET today pending - Take Lasix 40 mg BID x2 days, then take Lasix 40 mg every morning and 20 mg every evening.  - Continue carvedilol 6.25 mg BID - Continue Entresto 97-103 mg BID - Continue spironolactone 25 mg daily  - Continue Jardiance 10 mg daily - Start hydralazine 25 mg TID and isosorbide mononitrate 30 mg daily. - Also consider definitive LHC to r/o CAD given multiple CFRs, cMRI  + sleep study to r/o OSA.  - Plan to repeat ECHO in 2 months.    2. Hypertension  -Improved but still elevated. Start hydralazine and isosorbide mononitrate as above.    3. Type 2DM  - poorly controlled, recent A1c 14 - on Jardiance + Insulin - per PCP . Established with Dr Sherian Rein.    4. Obesity  - Body mass index is 41.84 kg/m. - Discussed portion control   5. SDOH  - He was seen by HFSW 02/07/21  -CSW completed an application for [facility] for disability and food stamp applications.      Follow up 3 weeks with Dr. Gala Romney.    Karle Plumber, PharmD, BCPS, BCCP, CPP Heart Failure Clinic Pharmacist [phone]

## 2021-05-13 ENCOUNTER — Ambulatory Visit (HOSPITAL_COMMUNITY)
Admission: RE | Admit: 2021-05-13 | Discharge: 2021-05-13 | Disposition: A | Discharge status: Home / Self Care | Payer: Self-pay | Source: Ambulatory Visit | Attending: Internal Medicine | Admitting: Internal Medicine

## 2021-05-13 ENCOUNTER — Other Ambulatory Visit: Payer: Self-pay

## 2021-05-13 ENCOUNTER — Telehealth (HOSPITAL_COMMUNITY): Payer: Self-pay | Admitting: Pharmacist

## 2021-05-13 VITALS — BP 154/98 | HR 88 | Wt 283.0 lb

## 2021-05-13 DIAGNOSIS — Z5941 Food insecurity: Secondary | ICD-10-CM | POA: Insufficient documentation

## 2021-05-13 DIAGNOSIS — Z733 Stress, not elsewhere classified: Secondary | ICD-10-CM | POA: Insufficient documentation

## 2021-05-13 DIAGNOSIS — Z79899 Other long term (current) drug therapy: Secondary | ICD-10-CM | POA: Insufficient documentation

## 2021-05-13 DIAGNOSIS — E669 Obesity, unspecified: Secondary | ICD-10-CM | POA: Insufficient documentation

## 2021-05-13 DIAGNOSIS — Z597 Insufficient social insurance and welfare support: Secondary | ICD-10-CM | POA: Insufficient documentation

## 2021-05-13 DIAGNOSIS — E785 Hyperlipidemia, unspecified: Secondary | ICD-10-CM | POA: Insufficient documentation

## 2021-05-13 DIAGNOSIS — E1165 Type 2 diabetes mellitus with hyperglycemia: Secondary | ICD-10-CM | POA: Insufficient documentation

## 2021-05-13 DIAGNOSIS — Z6841 Body Mass Index (BMI) 40.0 and over, adult: Secondary | ICD-10-CM | POA: Insufficient documentation

## 2021-05-13 DIAGNOSIS — Z7984 Long term (current) use of oral hypoglycemic drugs: Secondary | ICD-10-CM | POA: Insufficient documentation

## 2021-05-13 DIAGNOSIS — Z596 Low income: Secondary | ICD-10-CM | POA: Insufficient documentation

## 2021-05-13 DIAGNOSIS — I340 Nonrheumatic mitral (valve) insufficiency: Secondary | ICD-10-CM | POA: Insufficient documentation

## 2021-05-13 DIAGNOSIS — I110 Hypertensive heart disease with heart failure: Secondary | ICD-10-CM | POA: Insufficient documentation

## 2021-05-13 DIAGNOSIS — I50814 Right heart failure due to left heart failure: Secondary | ICD-10-CM | POA: Insufficient documentation

## 2021-05-13 DIAGNOSIS — I5022 Chronic systolic (congestive) heart failure: Secondary | ICD-10-CM

## 2021-05-13 LAB — BASIC METABOLIC PANEL
Anion gap: 9 (ref 5–15)
BUN: 14 mg/dL (ref 6–20)
CO2: 26 mmol/L (ref 22–32)
Calcium: 9.1 mg/dL (ref 8.9–10.3)
Chloride: 102 mmol/L (ref 98–111)
Creatinine, Ser: 1.35 mg/dL — ABNORMAL HIGH (ref 0.61–1.24)
GFR, Estimated: >60 mL/min (ref >60)
Glucose, Bld: 140 mg/dL — ABNORMAL HIGH (ref 70–99)
Potassium: 3.5 mmol/L (ref 3.5–5.1)
Sodium: 137 mmol/L (ref 135–145)

## 2021-05-13 MED ORDER — ISOSORBIDE MONONITRATE ER 30 MG PO TB24
30.0000 mg | ORAL_TABLET | Freq: Every day | ORAL | 5 refills | Status: DC
  Filled 2021-05-13: qty 30, 30d supply, fill #0
  Filled 2021-06-22: qty 30, 30d supply, fill #1
  Filled 2021-07-27: qty 30, 30d supply, fill #2

## 2021-05-13 MED ORDER — FUROSEMIDE 40 MG PO TABS
ORAL_TABLET | ORAL | 11 refills | Status: DC
  Filled 2021-05-13: qty 45, 30d supply, fill #0

## 2021-05-13 MED ORDER — HYDRALAZINE HCL 25 MG PO TABS
25.0000 mg | ORAL_TABLET | Freq: Three times a day (TID) | ORAL | 5 refills | Status: DC
  Filled 2021-05-13: qty 90, 30d supply, fill #0
  Filled 2021-06-22: qty 90, 30d supply, fill #1
  Filled 2021-07-27 (×2): qty 90, 30d supply, fill #2

## 2021-05-13 NOTE — Patient Instructions (Addendum)
It was a pleasure seeing you today!  MEDICATIONS: -We are changing your medications today - Take furosemide (Lasix) 40 mg (1 tablet) twice daily for 2 days, then take Lasix 40 mg (1 tablet) every morning and 20 mg (1/2 tab) every evening.  -Start hydralazine 25 mg (1 tablet) three times daily -Start isosorbide mononitrate (Imdur) 30 mg (1 tablet) daily -Call if you have questions about your medications.  LABS: -We will call you if your labs need attention.  NEXT APPOINTMENT: Return to clinic in 3 weeks with Dr. Gala Romney.  In general, to take care of your heart failure: -Limit your fluid intake to 2 Liters (half-gallon) per day.   -Limit your salt intake to ideally 2-3 grams (2000-3000 mg) per day. -Weigh yourself daily and record, and bring that "weight diary" to your next appointment.  (Weight gain of 2-3 pounds in 1 day typically means fluid weight.) -The medications for your heart are to help your heart and help you live longer.   -Please contact us before stopping any of your heart medications.  Call the clinic at [phone] with questions or to reschedule future appointments.

## 2021-05-13 NOTE — Telephone Encounter (Signed)
Medication Samples have been provided to the patient.   Drug name: Sherryll Burger      Strength: 49/51 mg      Qty: 1 bottle                 LOT: [id number] Exp.Date: 02/2023   Dosing instructions: 2 tablets twice daily (patient's current dose is 97/103 mg BID)   The patient has been instructed regarding the correct time, dose, and frequency of taking this medication, including desired effects and most common side effects.   Karle Plumber, PharmD, BCPS, CPP Heart Failure Clinic Pharmacist [phone]

## 2021-05-18 ENCOUNTER — Telehealth (HOSPITAL_COMMUNITY): Payer: Self-pay

## 2021-05-18 NOTE — Telephone Encounter (Signed)
Attempted to reach Terry Young for a home visit with no success. I will continue to attempt to reach out.

## 2021-05-19 NOTE — Telephone Encounter (Signed)
CSW called pt to check in regarding [employer] application status.  Pt reports he has not heard back yet- spoke to them Monday and was told the script is still under process.  CSW called [employer] who confirmed the script is in the final stages- CSW had them mark it as Urgent as pt will run out of medications Saturday.  They report he should be called today or tomorrow to arrange shipment.  Pt updated- he will let CSW know the outcome of this.  Will continue to follow and assist as needed  Burna Sis, LCSW Clinical Social Worker Advanced Heart Failure Clinic Desk#: [phone] Cell#: [phone]

## 2021-05-23 ENCOUNTER — Other Ambulatory Visit: Payer: Self-pay | Admitting: Family Medicine

## 2021-05-23 ENCOUNTER — Other Ambulatory Visit: Payer: Self-pay

## 2021-05-23 DIAGNOSIS — Z794 Long term (current) use of insulin: Secondary | ICD-10-CM

## 2021-05-23 DIAGNOSIS — E1165 Type 2 diabetes mellitus with hyperglycemia: Secondary | ICD-10-CM

## 2021-05-24 ENCOUNTER — Other Ambulatory Visit: Payer: Self-pay

## 2021-05-24 MED ORDER — GABAPENTIN 300 MG PO CAPS
300.0000 mg | ORAL_CAPSULE | Freq: Three times a day (TID) | ORAL | 1 refills | Status: DC
  Filled 2021-05-24: qty 90, 30d supply, fill #0
  Filled 2021-06-22: qty 90, 30d supply, fill #1

## 2021-05-24 MED ORDER — LANTUS SOLOSTAR 100 UNIT/ML ~~LOC~~ SOPN
20.0000 [IU] | PEN_INJECTOR | Freq: Every day | SUBCUTANEOUS | 0 refills | Status: DC
  Filled 2021-05-24: qty 6, 30d supply, fill #0

## 2021-05-24 NOTE — Telephone Encounter (Signed)
Requested medications are due for refill today.  yes  Requested medications are on the active medications list.  yes  Last refill.   Lantus refilled 04/12/2021 - Rx expires 05/25/2021. Gabapentin refilled 02/15/2021  Future visit scheduled.   no  Notes to clinic.  Chart does not list any PCP. Pt was to [facility] 05/18/2021 for follow up.    Requested Prescriptions  Pending Prescriptions Disp Refills   insulin glargine (LANTUS SOLOSTAR) 100 UNIT/ML Solostar Pen 6 mL 0    Sig: Inject 20 Units into the skin daily     Endocrinology:  Diabetes - Insulins Failed - 05/23/2021  8:58 AM      Failed - HBA1C is between 0 and 7.9 and within 180 days    Hgb A1c MFr Bld  Date Value Ref Range Status  01/27/2021 14.4 (H) 4.8 - 5.6 % Final    Comment:    (NOTE) Pre diabetes:          5.7%-6.4%  Diabetes:              >6.4%  Glycemic control for   <7.0% adults with diabetes           Passed - Valid encounter within last 6 months    Recent Outpatient Visits           3 months ago Type 2 diabetes mellitus with hyperglycemia, with long-term current use of insulin (HCC)   [facility] Edmonson, [place], MD               gabapentin (NEURONTIN) 300 MG capsule 90 capsule 1    Sig: Take 1 capsule (300 mg total) by mouth 3 (three) times daily.     Neurology: Anticonvulsants - gabapentin Failed - 05/23/2021  8:58 AM      Failed - Cr in normal range and within 360 days    Creatinine, Ser  Date Value Ref Range Status  05/13/2021 1.35 (H) 0.61 - 1.24 mg/dL Final          Passed - Completed PHQ-2 or PHQ-9 in the last 360 days      Passed - Valid encounter within last 12 months    Recent Outpatient Visits           3 months ago Type 2 diabetes mellitus with hyperglycemia, with long-term current use of insulin (HCC)   [facility] Hoy Register, MD

## 2021-05-25 ENCOUNTER — Telehealth (HOSPITAL_COMMUNITY): Payer: Self-pay

## 2021-05-25 ENCOUNTER — Other Ambulatory Visit: Payer: Self-pay

## 2021-05-25 NOTE — Telephone Encounter (Signed)
Called Terry Young to set up home paramedicine visit and to remind him of his upcoming clinic visit on Tuesday. He did not answer. I left a message. I will continue to reach out.

## 2021-05-30 ENCOUNTER — Telehealth (HOSPITAL_COMMUNITY): Payer: Self-pay

## 2021-05-30 NOTE — Telephone Encounter (Signed)
Called and left patient a voice message to confirm/remind patient of their appointment at the Advanced Heart Failure Clinic on 05/31/21.  ° ° °

## 2021-05-30 NOTE — Progress Notes (Signed)
ADVANCED HF CLINIC CONSULT NOTE  Referring Physician: Darrick Grinder, NP Primary Care: Charlott Rakes, MD HF Cardiologist: Dr. Haroldine Laws  HPI: Terry Young is a 51 y.o. male w/ chronic biventricular systolic heart failure, HTN, poorly controlled T2DM, HLD and obesity.   Previously followed by cardiology at [facility]. Per Pt report, he thinks he was diagnosed w/ CHF ~2015 though no records are available during that time. He recalls being diagnosed w/ CHF shortly after a bout w/ a viral illness, which he believes was flu. Per records in [place], he had an Echo in 2018 w/ EF 40-45%. NST showed no ischemia. CM felt likely NICM from uncontrolled HTN, per notes from [facility]. He has never had a LHC. Uncertain if he has had a cMRI.    Admitted 10/22 to [facility] for a/c CHF and seen by Dr. Terrence Dupont. Echo w/ severe biventricular dysfunction. LVEF down to <20%, RV severely reduced w/ global HK, GIIDD, no LVH, moderately elevated RVSP 52 mmHg w/ mod TR. Only mild Terry. He was diuresed w/ IV Lasix and placed on GDMT. On Jardiance, Entresto, Spiro and Coreg. Discharge wt 297 lb. Of note, his DM is poorly controlled, recent Hgb A1c 14.    He was seen in the HF [facility] in October 2022. Entresto was increased 97-103 mg bid and lasix was cut back to 40 mg daily. Follow up in [facility] 1/23 and out of all meds x 2 weeks except Entresto. Meds restarted and referred to paramedicine.   Today he returns for HF follow up. Overall feeling fine. He is doing some exercising at home and walking. He is not SOB with stairs, limited by leg weakness mostly. He has some foot swelling. He is concerned about his hand pain, thinks they are swollen. Having difficulty opening soda cans. Denies CP, dizziness, edema, or PND/Orthopnea. Appetite ok. No fever or chills. Weight at home 270 pounds. Taking all medications. Food stamps had been on hold.  He is uninsured and does not work. He lives with his brother and nephew. His application for disability with the  [facility] is pending. He plays 10 instruments. Paramedicine unable to arrange home visit yet. No ETOH or tobacco use.    Cardiac Testing    - Care Everywhere ([facility])--> Echo (2018): EF 40-45%  - Echo (01/27/21): EF < 20% Grade II DD RV severely reduced   - Care Everywhere [facility]) Myoview (2018)>> No ischemia    - Echo (01/27/21): EF < 20% Grade II DD RV severely reduced  Review of Systems: [y] = yes, [ ]  = no   General: Weight gain [ ] ; Weight loss [ ] ; Anorexia [ ] ; Fatigue [ ] ; Fever [ ] ; Chills [ ] ; Weakness Blue.Reese ]  Cardiac: Chest pain/pressure [ ] ; Resting SOB [ ] ; Exertional SOB [ ] ; Orthopnea [ ] ; Pedal Edema [ y]; Palpitations [ ] ; Syncope [ ] ; Presyncope [ ] ; Paroxysmal nocturnal dyspnea[ ]   Pulmonary: Cough [ ] ; Wheezing[ ] ; Hemoptysis[ ] ; Sputum [ ] ; Snoring Blue.Reese ]  GI: Vomiting[ ] ; Dysphagia[ ] ; Melena[ ] ; Hematochezia [ ] ; Heartburn[ ] ; Abdominal pain [ ] ; Constipation [ ] ; Diarrhea [ ] ; BRBPR [ ]   GU: Hematuria[ ] ; Dysuria [ ] ; Nocturia[ ]   Vascular: Pain in legs with walking [ ] ; Pain in feet with lying flat [ ] ; Non-healing sores [ ] ; Stroke [ ] ; TIA [ ] ; Slurred speech [ ] ;  Neuro: Headaches[ ] ; Vertigo[ ] ; Seizures[ ] ; Paresthesias[ ] ;Blurred vision [ ] ; Diplopia [ ] ; Vision changes [ ]   Ortho/Skin: Arthritis [ ] ; Joint pain [ ] ; Muscle pain [ ] ; Joint swelling [ ] ; Back Pain [ ] ; Rash [ ]   Psych: Depression[ ] ; Anxiety[ ]   Heme: Bleeding problems [ ] ; Clotting disorders [ ] ; Anemia [ ]   Endocrine: Diabetes Blue.Reese ]; Thyroid dysfunction[ ]   Past Medical History:  Diagnosis Date   CHF (congestive heart failure) (HCC)    Diabetes mellitus without complication (HCC)    Hypertension    Current Outpatient Medications  Medication Sig Dispense Refill   acetaminophen (TYLENOL) 325 MG tablet Take 650 mg by mouth as needed.     aspirin 81 MG EC tablet Take 1 tablet (81 mg total) by mouth daily. 90 tablet 0   atorvastatin (LIPITOR) 40 MG tablet Take 1 tablet (40 mg total) by mouth  daily. 90 tablet 1   carvedilol (COREG) 6.25 MG tablet Take 1 tablet (6.25 mg total) by mouth 2 (two) times daily with a meal. 180 tablet 1   empagliflozin (JARDIANCE) 10 MG TABS tablet Take 1 tablet (10 mg total) by mouth daily. 90 tablet 1   furosemide (LASIX) 40 MG tablet Take 40 mg (1 tablet) every morning and 20 mg (1/2 tablet) every evening. 45 tablet 11   gabapentin (NEURONTIN) 300 MG capsule Take 1 capsule (300 mg total) by mouth 3 (three) times daily. 90 capsule 1   hydrALAZINE (APRESOLINE) 25 MG tablet Take 1 tablet (25 mg total) by mouth 3 (three) times daily. 90 tablet 5   insulin glargine (LANTUS SOLOSTAR) 100 UNIT/ML Solostar Pen Inject 20 Units into the skin daily 6 mL 0   Insulin Pen Needle 32G X 4 MM MISC Use to inject insulin as directed. 100 each 0   isosorbide mononitrate (IMDUR) 30 MG 24 hr tablet Take 1 tablet (30 mg total) by mouth daily. 30 tablet 5   metFORMIN (GLUCOPHAGE) 1000 MG tablet Take 1 tablet (1,000 mg total) by mouth 2 (two) times daily with a meal. 180 tablet 1   Multiple Vitamins-Minerals (CENTRUM MEN) TABS Take 1 tablet by mouth daily.     potassium chloride SA (KLOR-CON M) 20 MEQ tablet Take 1 tablet (20 mEq total) by mouth daily. 30 tablet 1   sacubitril-valsartan (ENTRESTO) 97-103 MG Take 1 tablet by mouth 2 (two) times daily. 180 tablet 3   spironolactone (ALDACTONE) 25 MG tablet Take 1 tablet (25 mg total) by mouth daily. 90 tablet 1   No current facility-administered medications for this encounter.   Allergies  Allergen Reactions   Lisinopril Cough   Social History   Socioeconomic History   Marital status: Single    Spouse name: Not on file   Number of children: Not on file   Years of education: Not on file   Highest education level: Not on file  Occupational History   Not on file  Tobacco Use   Smoking status: Never   Smokeless tobacco: Never  Substance and Sexual Activity   Alcohol use: Never   Drug use: Never   Sexual activity: Not  on file  Other Topics Concern   Not on file  Social History Narrative   Not on file   Social Determinants of Health   Financial Resource Strain: High Risk   Difficulty of Paying Living Expenses: Very hard  Food Insecurity: Food Insecurity Present   Worried About Running Out of Food in the Last Year: Sometimes true   Ran Out of Food in the Last Year: Sometimes true  Transportation Needs: No Transportation  Needs   Lack of Transportation (Medical): No   Lack of Transportation (Non-Medical): No  Physical Activity: Not on file  Stress: Not on file  Social Connections: Not on file  Intimate Partner Violence: Not on file   Family History  Problem Relation Age of Onset   Atrial fibrillation Neg Hx    Family h/o is notable for CHF, CAD and Afib (father).   BP 120/70 (BP Location: Right Arm, Patient Position: Sitting)    Pulse 98    Wt 124.2 kg (273 lb 12.8 oz)    SpO2 100%    BMI 39.29 kg/m   Wt Readings from Last 3 Encounters:  05/31/21 124.2 kg (273 lb 12.8 oz)  05/13/21 128.4 kg (283 lb)  04/25/21 132.3 kg (291 lb 9.6 oz)   PHYSICAL EXAM: General:  NAD. No resp difficulty HEENT: Normal Neck: Supple. No JVD. Carotids 2+ bilat; no bruits. No lymphadenopathy or thryomegaly appreciated. Cor: PMI nondisplaced. Regular rate & rhythm. No rubs, gallops or murmurs. Lungs: Clear Abdomen: Obese, nontender, nondistended. No hepatosplenomegaly. No bruits or masses. Good bowel sounds. Extremities: No cyanosis, clubbing, rash, edema Neuro: Alert & oriented x 3, cranial nerves grossly intact. Moves all 4 extremities w/o difficulty. Affect pleasant.  ASSESSMENT & PLAN: 1. Chronic Biventricular Systolic Heart Failure   - presumed NICM, likely hypertensive CM but cannot r/o viral CM  - Echo 2018 ([facility]) EF 40-45%, NST low risk. Has never had LHC nor cMRI  - Echo 10/22 EF <20%, RV severely reduced. - NYHA II, he is not volume overloaded on exam. - I do not think he needs digoxin.  - Continue  Jardiance 10 mg daily.  - Continue Entresto 97-103 mg bid. - Continue Lasix 40 mg q AM/20  mg q PM.   - Continue spironolactone 25 mg daily.  - Continue Coreg 6.25 mg bid. - Continue hydralazine 25 mg tid + Imdur 30 mg daily. - Also consider definitive LHC to r/o CAD given multiple CFRs, cMRI  + sleep study to r/o OSA.  - Plan to repeat ECHO in 2 months.  - Labs today. Continue paramedicine.   2. Hypertension  - Controlled. - GDMT as above. - Needs sleep study to r/o OSA, await insurance.   3. Type 2DM  - Poorly controlled, A1c 14. 4 (10/22). - On Jardiance + Insulin. - Per PCP.   4. Obesity  - Body mass index is 39.29 kg/m. - Discussed portion control.   5. Bilateral hand pain - Hands are not visibly swollen. - Sounds like Carpal tunnel vs neuropathy (Phalen's test negative). - Advised PCP follow up.  6. SDOH  - He needs PCP follow up. - CSW completed an application for [facility] for disability and food stamp applications.   - He has been referred to HF Paramedicine for medication management. Suspect he will not need this long term but would like to provide as much support as we can until he has insurance and medication issues worked.    Follow up with Dr. Haroldine Laws in 2 months + echo.  Allena Katz, FNP-BC 05/31/21

## 2021-05-31 ENCOUNTER — Encounter (HOSPITAL_COMMUNITY): Payer: Self-pay

## 2021-05-31 ENCOUNTER — Other Ambulatory Visit: Payer: Self-pay

## 2021-05-31 ENCOUNTER — Telehealth (HOSPITAL_COMMUNITY): Payer: Self-pay | Admitting: Surgery

## 2021-05-31 ENCOUNTER — Ambulatory Visit (HOSPITAL_COMMUNITY)
Admission: RE | Admit: 2021-05-31 | Discharge: 2021-05-31 | Disposition: A | Discharge status: Home / Self Care | Payer: Self-pay | Source: Ambulatory Visit | Attending: Family Medicine | Admitting: Family Medicine

## 2021-05-31 VITALS — BP 120/70 | HR 98 | Wt 273.8 lb

## 2021-05-31 DIAGNOSIS — E119 Type 2 diabetes mellitus without complications: Secondary | ICD-10-CM

## 2021-05-31 DIAGNOSIS — I110 Hypertensive heart disease with heart failure: Secondary | ICD-10-CM | POA: Insufficient documentation

## 2021-05-31 DIAGNOSIS — E669 Obesity, unspecified: Secondary | ICD-10-CM | POA: Insufficient documentation

## 2021-05-31 DIAGNOSIS — Z7984 Long term (current) use of oral hypoglycemic drugs: Secondary | ICD-10-CM | POA: Insufficient documentation

## 2021-05-31 DIAGNOSIS — M79642 Pain in left hand: Secondary | ICD-10-CM

## 2021-05-31 DIAGNOSIS — I5023 Acute on chronic systolic (congestive) heart failure: Secondary | ICD-10-CM

## 2021-05-31 DIAGNOSIS — I10 Essential (primary) hypertension: Secondary | ICD-10-CM

## 2021-05-31 DIAGNOSIS — Z7901 Long term (current) use of anticoagulants: Secondary | ICD-10-CM | POA: Insufficient documentation

## 2021-05-31 DIAGNOSIS — Z79899 Other long term (current) drug therapy: Secondary | ICD-10-CM | POA: Insufficient documentation

## 2021-05-31 DIAGNOSIS — Z6839 Body mass index (BMI) 39.0-39.9, adult: Secondary | ICD-10-CM | POA: Insufficient documentation

## 2021-05-31 DIAGNOSIS — I5022 Chronic systolic (congestive) heart failure: Secondary | ICD-10-CM

## 2021-05-31 DIAGNOSIS — Z794 Long term (current) use of insulin: Secondary | ICD-10-CM

## 2021-05-31 DIAGNOSIS — E785 Hyperlipidemia, unspecified: Secondary | ICD-10-CM | POA: Insufficient documentation

## 2021-05-31 DIAGNOSIS — I5082 Biventricular heart failure: Secondary | ICD-10-CM | POA: Insufficient documentation

## 2021-05-31 DIAGNOSIS — M79641 Pain in right hand: Secondary | ICD-10-CM

## 2021-05-31 DIAGNOSIS — M7989 Other specified soft tissue disorders: Secondary | ICD-10-CM | POA: Insufficient documentation

## 2021-05-31 DIAGNOSIS — E1165 Type 2 diabetes mellitus with hyperglycemia: Secondary | ICD-10-CM | POA: Insufficient documentation

## 2021-05-31 LAB — BASIC METABOLIC PANEL
Anion gap: 11 (ref 5–15)
BUN: 17 mg/dL (ref 6–20)
CO2: 25 mmol/L (ref 22–32)
Calcium: 8.8 mg/dL — ABNORMAL LOW (ref 8.9–10.3)
Chloride: 102 mmol/L (ref 98–111)
Creatinine, Ser: 1.61 mg/dL — ABNORMAL HIGH (ref 0.61–1.24)
GFR, Estimated: 52 mL/min — ABNORMAL LOW (ref >60)
Glucose, Bld: 145 mg/dL — ABNORMAL HIGH (ref 70–99)
Potassium: 3.9 mmol/L (ref 3.5–5.1)
Sodium: 138 mmol/L (ref 135–145)

## 2021-05-31 LAB — BRAIN NATRIURETIC PEPTIDE: B Natriuretic Peptide: 43.3 pg/mL (ref 0.0–100.0)

## 2021-05-31 LAB — URIC ACID: Uric Acid, Serum: 10.7 mg/dL — ABNORMAL HIGH (ref 3.7–8.6)

## 2021-05-31 NOTE — Patient Instructions (Signed)
Thank you for your visit.  There has been no changes to your medications today.  Labs done today, your results will be available in MyChart, we will contact you for abnormal readings.  Your physician has requested that you have an echocardiogram. Echocardiography is a painless test that uses sound waves to create images of your heart. It provides your doctor with information about the size and shape of your heart and how well your hearts chambers and valves are working. This procedure takes approximately one hour. There are no restrictions for this procedure. Your physician recommends that you schedule a follow-up appointment in: with your PCP For your hand pain.  Your physician recommends that you schedule a follow-up appointment in: 3 months with Dr. Adalberto Ill with and Echocardiogram.  If you have any questions or concerns before your next appointment please send [place] a message through [facility] or call our office at [phone].    TO LEAVE A MESSAGE FOR THE NURSE SELECT OPTION 2, PLEASE LEAVE A MESSAGE INCLUDING: YOUR NAME DATE OF BIRTH CALL BACK NUMBER REASON FOR CALL**this is important as we prioritize the call backs  YOU WILL RECEIVE A CALL BACK THE SAME DAY AS LONG AS YOU CALL BEFORE 4:00 PM  At the Advanced Heart Failure Clinic, you and your health needs are our priority. As part of our continuing mission to provide you with exceptional heart care, we have created designated Provider Care Teams. These Care Teams include your primary Cardiologist (physician) and Advanced Practice Providers (APPs- Physician Assistants and Nurse Practitioners) who all work together to provide you with the care you need, when you need it.   You may see any of the following providers on your designated Care Team at your next follow up: Dr Arvilla Meres Dr Carron Curie, NP Robbie Lis, [place] [facility] [place], [place] Karle Plumber, PharmD   Please be sure to bring in all  your medications bottles to every appointment.

## 2021-05-31 NOTE — Telephone Encounter (Signed)
Patient called in reference to results and recommendations per Jessica Milford NP.  I left a message for a return call. °

## 2021-05-31 NOTE — Telephone Encounter (Signed)
-----   Message from Jessica M Milford, FNP sent at 05/31/2021  3:39 PM EST ----- °Kidney function elevated. Decrease Lasix to 40 mg daily. Repeat BMET in 2 weeks. °

## 2021-06-01 ENCOUNTER — Telehealth (HOSPITAL_COMMUNITY): Payer: Self-pay | Admitting: Licensed Clinical Social Worker

## 2021-06-01 NOTE — Telephone Encounter (Signed)
HF Paramedicine Team Based Care Meeting  HF MD- NA  HF NP - Terry Clegg NP-C   [facility] HF Paramedicine  Terry Young  [facility] admit within the last 30 days for heart failure? no  Unsure what current needs or deficits are because have been unable to complete home visit.  Have had phone issues making it hard to communicate with.  Eligible for discharge? Will continue to attempt contact with pt and complete initial visit- if communication barriers continue with consider discharge  Burna Sis, LCSW Clinical Social Worker Advanced Heart Failure Clinic Desk#: [phone] Cell#: [phone]

## 2021-06-02 ENCOUNTER — Other Ambulatory Visit: Payer: Self-pay

## 2021-06-02 ENCOUNTER — Telehealth (HOSPITAL_COMMUNITY): Payer: Self-pay | Admitting: Cardiology

## 2021-06-02 DIAGNOSIS — I5022 Chronic systolic (congestive) heart failure: Secondary | ICD-10-CM

## 2021-06-02 MED ORDER — FUROSEMIDE 40 MG PO TABS
40.0000 mg | ORAL_TABLET | Freq: Every day | ORAL | 11 refills | Status: DC
  Filled 2021-06-02 – 2021-06-22 (×2): qty 30, 30d supply, fill #0
  Filled 2021-07-27: qty 30, 30d supply, fill #1

## 2021-06-02 NOTE — Telephone Encounter (Signed)
Patient called.  Patient aware. Repeat labs 3/2

## 2021-06-02 NOTE — Telephone Encounter (Signed)
-----   Message from Rafael Bihari, [place] sent at 05/31/2021  3:39 PM EST ----- Kidney function elevated. Decrease Lasix to 40 mg daily. Repeat BMET in 2 weeks.

## 2021-06-09 ENCOUNTER — Telehealth (HOSPITAL_COMMUNITY): Payer: Self-pay

## 2021-06-09 NOTE — Telephone Encounter (Signed)
Reached out to Terry Young about his interest in paramedicine as I have been unable to reach him for >3 weeks. He did not answer so message was left on VM. I will send to HF clinic team for assessing next steps. Call complete.

## 2021-06-13 ENCOUNTER — Telehealth (HOSPITAL_COMMUNITY): Payer: Self-pay | Admitting: Licensed Clinical Social Worker

## 2021-06-13 NOTE — Telephone Encounter (Signed)
CSW consulted by [employer] Paramedic to help reach out to pt to discuss continued involvement with Paramedicine program as paramedic had been unable to reach pt for 3weeks.  CSW spoke with pt who acknowledges he has been hard to get a hold of due to 2 recent losses in the family.  Pt confirms his continued desire to establish with paramedicine- reports he has [facility] number and will reach out to schedule initial visit.  No further needs at this time- will continue to follow and assist as needed  Jorge Ny, [place] Worker [facility] Desk#: [phone] Cell#: [phone]

## 2021-06-16 ENCOUNTER — Telehealth (HOSPITAL_COMMUNITY): Payer: Self-pay

## 2021-06-16 ENCOUNTER — Other Ambulatory Visit: Payer: Self-pay

## 2021-06-16 ENCOUNTER — Ambulatory Visit (HOSPITAL_COMMUNITY)
Admission: RE | Admit: 2021-06-16 | Discharge: 2021-06-16 | Disposition: A | Discharge status: Home / Self Care | Payer: Self-pay | Source: Ambulatory Visit | Attending: Cardiology | Admitting: Cardiology

## 2021-06-16 DIAGNOSIS — I5022 Chronic systolic (congestive) heart failure: Secondary | ICD-10-CM | POA: Insufficient documentation

## 2021-06-16 LAB — BASIC METABOLIC PANEL
Anion gap: 10 (ref 5–15)
BUN: 17 mg/dL (ref 6–20)
CO2: 26 mmol/L (ref 22–32)
Calcium: 9.2 mg/dL (ref 8.9–10.3)
Chloride: 103 mmol/L (ref 98–111)
Creatinine, Ser: 1.25 mg/dL — ABNORMAL HIGH (ref 0.61–1.24)
GFR, Estimated: >60 mL/min (ref >60)
Glucose, Bld: 119 mg/dL — ABNORMAL HIGH (ref 70–99)
Potassium: 4.1 mmol/L (ref 3.5–5.1)
Sodium: 139 mmol/L (ref 135–145)

## 2021-06-16 NOTE — Telephone Encounter (Signed)
Spoke to Terry Young who agreed to home paramedicine visit on Wednesday 3/8 at 2:30. Call complete.  ?

## 2021-06-22 ENCOUNTER — Other Ambulatory Visit: Payer: Self-pay | Admitting: Family Medicine

## 2021-06-22 ENCOUNTER — Other Ambulatory Visit (HOSPITAL_COMMUNITY): Payer: Self-pay

## 2021-06-22 DIAGNOSIS — E1165 Type 2 diabetes mellitus with hyperglycemia: Secondary | ICD-10-CM

## 2021-06-22 DIAGNOSIS — Z794 Long term (current) use of insulin: Secondary | ICD-10-CM

## 2021-06-22 NOTE — Progress Notes (Signed)
Paramedicine Encounter ? ? ? Patient ID: Terry Young, male    DOB: 07-29-1970, 51 y.o.   MRN: [id number] ? ? ?Met with Mr. Whitenight today who reports feeling well with no complaints of shortness of breath, dizziness, chest pain or trouble taking his medications. He has pill bottles which are mostly empty as he is needing refills. I checked and verified fill dates and he has been taking them as prescribed except his Potassium he reports he has not been taking because his multi-vitamin has Potassium in it. I explained that he should be taking the Potassium supplement as prescribed. He agreed with plan.  ? ?I obtained vitals and assessment and confirmed he is weighing daily. Todays weight is 272lbs.  ? ?No lower leg edema noted, lung sounds clear. He denied shortness of breath while walking.  ? ?He reports some bilateral hand pain making it hard to open certain things or pick things up. I will assist him in getting a PCP apt for follow up of this.  ? ?CBG- 138  ? ?Brylen reports he lives in his apartment with his nephew who works at [employer] and his disabled brother who has autism in which he cares for full time. Avner reports he does not have an income and he applied for disability and returned some documents they needed last week. I will have Jenna follow up.  ? ?He also reports Kennyth Lose assisted with insurance sign up, I will follow up with this.  ? ?He reports he and his nephew share a vehicle and appointments are best scheduled on Mondays and Tuesdays as this is when he has the vehicle.  ? ?He reports their rent is 1000 a month now and it is being paid but it gets hard sometimes with fixed income and utilities.  ? ?He needs food assistance. I will see if Eliezer Lofts can refer him to [facility] and assist with food stamps.  ? ?I plan to see Amad in two weeks. He understands if he needs anything to call clinic. He reports he will pick up prescriptions I called in and continue to take them as prescribed. Home visit  complete. ? ? ?Patient Care Team: ?Charlott Rakes, MD as PCP - General (Family Medicine) ? ?Patient Active Problem List  ? Diagnosis Date Noted  ? Hypokalemia 01/29/2021  ? Obesity, Class III, BMI 40-49.9 (morbid obesity) ([place]) 01/29/2021  ? Systolic CHF, acute on chronic ([place]) 01/27/2021  ? Acute on chronic combined systolic and diastolic CHF (congestive heart failure) ([place]) 01/26/2021  ? DM2 (diabetes mellitus, type 2) ([place]) 01/26/2021  ? HTN (hypertension) 01/26/2021  ? ? ?Current Outpatient Medications:  ?  acetaminophen (TYLENOL) 325 MG tablet, Take 650 mg by mouth as needed., Disp: , Rfl:  ?  aspirin 81 MG EC tablet, Take 1 tablet (81 mg total) by mouth daily., Disp: 90 tablet, Rfl: 0 ?  atorvastatin (LIPITOR) 40 MG tablet, Take 1 tablet (40 mg total) by mouth daily., Disp: 90 tablet, Rfl: 1 ?  carvedilol (COREG) 6.25 MG tablet, Take 1 tablet (6.25 mg total) by mouth 2 (two) times daily with a meal., Disp: 180 tablet, Rfl: 1 ?  empagliflozin (JARDIANCE) 10 MG TABS tablet, Take 1 tablet (10 mg total) by mouth daily., Disp: 90 tablet, Rfl: 1 ?  furosemide (LASIX) 40 MG tablet, Take 1 tablet (40 mg total) by mouth daily., Disp: 30 tablet, Rfl: 11 ?  gabapentin (NEURONTIN) 300 MG capsule, Take 1 capsule (300 mg total) by mouth 3 (three) times  daily., Disp: 90 capsule, Rfl: 1 ?  hydrALAZINE (APRESOLINE) 25 MG tablet, Take 1 tablet (25 mg total) by mouth 3 (three) times daily., Disp: 90 tablet, Rfl: 5 ?  insulin glargine (LANTUS SOLOSTAR) 100 UNIT/ML Solostar Pen, Inject 20 Units into the skin daily, Disp: 6 mL, Rfl: 0 ?  Insulin Pen Needle 32G X 4 MM MISC, Use to inject insulin as directed., Disp: 100 each, Rfl: 0 ?  isosorbide mononitrate (IMDUR) 30 MG 24 hr tablet, Take 1 tablet (30 mg total) by mouth daily., Disp: 30 tablet, Rfl: 5 ?  metFORMIN (GLUCOPHAGE) 1000 MG tablet, Take 1 tablet (1,000 mg total) by mouth 2 (two) times daily with a meal., Disp: 180 tablet, Rfl: 1 ?  Multiple Vitamins-Minerals (CENTRUM  MEN) TABS, Take 1 tablet by mouth daily., Disp: , Rfl:  ?  potassium chloride SA (KLOR-CON M) 20 MEQ tablet, Take 1 tablet (20 mEq total) by mouth daily., Disp: 30 tablet, Rfl: 1 ?  sacubitril-valsartan (ENTRESTO) 97-103 MG, Take 1 tablet by mouth 2 (two) times daily., Disp: 180 tablet, Rfl: 3 ?  spironolactone (ALDACTONE) 25 MG tablet, Take 1 tablet (25 mg total) by mouth daily., Disp: 90 tablet, Rfl: 1 ?Allergies  ?Allergen Reactions  ? Lisinopril Cough  ? ? ? ?Social History  ? ?Socioeconomic History  ? Marital status: Single  ?  Spouse name: Not on file  ? Number of children: Not on file  ? Years of education: Not on file  ? Highest education level: Not on file  ?Occupational History  ? Not on file  ?Tobacco Use  ? Smoking status: Never  ? Smokeless tobacco: Never  ?Substance and Sexual Activity  ? Alcohol use: Never  ? Drug use: Never  ? Sexual activity: Not on file  ?Other Topics Concern  ? Not on file  ?Social History Narrative  ? Not on file  ? ?Social Determinants of Health  ? ?Financial Resource Strain: High Risk  ? Difficulty of Paying Living Expenses: Very hard  ?Food Insecurity: Food Insecurity Present  ? Worried About Charity fundraiser in the Last Year: Sometimes true  ? Ran Out of Food in the Last Year: Sometimes true  ?Transportation Needs: No Transportation Needs  ? Lack of Transportation (Medical): No  ? Lack of Transportation (Non-Medical): No  ?Physical Activity: Not on file  ?Stress: Not on file  ?Social Connections: Not on file  ?Intimate Partner Violence: Not on file  ? ? ?Physical Exam ?Vitals reviewed.  ?Constitutional:   ?   Appearance: Normal appearance. He is obese.  ?HENT:  ?   Head: Normocephalic.  ?   Nose: Nose normal.  ?   Mouth/Throat:  ?   Mouth: Mucous membranes are moist.  ?   Pharynx: Oropharynx is clear.  ?Eyes:  ?   Conjunctiva/sclera: Conjunctivae normal.  ?   Pupils: Pupils are equal, round, and reactive to light.  ?Cardiovascular:  ?   Rate and Rhythm: Normal rate and  regular rhythm.  ?   Pulses: Normal pulses.  ?   Heart sounds: Normal heart sounds.  ?Pulmonary:  ?   Effort: Pulmonary effort is normal.  ?   Breath sounds: Normal breath sounds.  ?Abdominal:  ?   General: Abdomen is flat.  ?   Palpations: Abdomen is soft.  ?Musculoskeletal:     ?   General: No swelling. Normal range of motion.  ?   Cervical back: Normal range of motion.  ?   Right  lower leg: No edema.  ?   Left lower leg: No edema.  ?Skin: ?   General: Skin is warm and dry.  ?   Capillary Refill: Capillary refill takes less than 2 seconds.  ?Neurological:  ?   General: No focal deficit present.  ?   Mental Status: He is alert. Mental status is at baseline.  ?Psychiatric:     ?   Mood and Affect: Mood normal.  ? ? ? ? ? ? ?Future Appointments  ?Date Time Provider [place]  ?09/02/2021  9:00 AM [facility] OP 1 [facility]  ?09/02/2021 10:00 AM Bensimhon, Shaune Pascal, MD [facility] None  ? ? ? ?ACTION: ?Home visit completed ? ? ? ? ? ? ?

## 2021-06-23 ENCOUNTER — Other Ambulatory Visit: Payer: Self-pay

## 2021-06-24 ENCOUNTER — Other Ambulatory Visit: Payer: Self-pay

## 2021-06-28 ENCOUNTER — Other Ambulatory Visit: Payer: Self-pay

## 2021-06-29 ENCOUNTER — Telehealth (HOSPITAL_COMMUNITY): Payer: Self-pay | Admitting: Licensed Clinical Social Worker

## 2021-06-29 ENCOUNTER — Other Ambulatory Visit: Payer: Self-pay

## 2021-06-29 NOTE — Telephone Encounter (Signed)
Community paramedic informed CSW that pt had concerns with getting enough food and requesting help with food stamps and referral to food pantry. ? ?CSW spoke with pt who reports no immediate concerns with being out of food but would appreciate food pantry referral- completed and put in mail to patient ? ?CSW instructed pt how to apply for food stamps online- pt states he feels comfortable using the Internet and will plan to follow up  ? ?Jorge Ny, LCSW ?Clinical Social Worker ?[facility] ?Desk#: [phone] ?Cell#: [phone] ? ?

## 2021-07-06 ENCOUNTER — Telehealth (HOSPITAL_COMMUNITY): Payer: Self-pay

## 2021-07-06 ENCOUNTER — Other Ambulatory Visit (HOSPITAL_COMMUNITY): Payer: Self-pay

## 2021-07-06 DIAGNOSIS — E1165 Type 2 diabetes mellitus with hyperglycemia: Secondary | ICD-10-CM

## 2021-07-06 MED ORDER — LANTUS SOLOSTAR 100 UNIT/ML ~~LOC~~ SOPN
20.0000 [IU] | PEN_INJECTOR | Freq: Every day | SUBCUTANEOUS | 0 refills | Status: DC
  Filled 2021-07-06: qty 30, 150d supply, fill #0

## 2021-07-06 NOTE — Telephone Encounter (Signed)
Spoke to Terry Young who reports he needs refills sent to pharmacy for insulin. I will forward to PCP Dr. Alvis Lemmings for same.  ?

## 2021-07-06 NOTE — Progress Notes (Signed)
Paramedicine Encounter ? ? ? Patient ID: Terry Young, male    DOB: 24-Feb-1971, 51 y.o.   MRN: [id number] ? ? ?Arrived for home visit for Terry Young who reports feeling good with no complaints aside from some bilateral hand pain. He states this started after he started [place]. I will forward this to [place] pharmacist at HF clinic and we will follow up with PCP on same. He states he picked up all medications we called in two weeks ago and has been taking them as prescribed. He reports needing refill on insulin. [place] denied chest pain, shortness of breath, dizziness.  ? ?Vitals obtained and assessment as noted. No lower leg edema. Lungs clear. He denied difficulty walking or shortness of breath with movement or being seated.  ? ?Meds reviewed and confirmed. He does not use a pill box.  ? ?Home visit complete.  ? ?I will send in refill request for Lantus.  ? ?I will assist in getting PCP appointment.  ? ?Patient Care Team: ?Charlott Rakes, MD as PCP - General (Family Medicine) ? ?Patient Active Problem List  ? Diagnosis Date Noted  ? Hypokalemia 01/29/2021  ? Obesity, Class III, BMI 40-49.9 (morbid obesity) ([place]) 01/29/2021  ? Systolic CHF, acute on chronic ([place]) 01/27/2021  ? Acute on chronic combined systolic and diastolic CHF (congestive heart failure) ([place]) 01/26/2021  ? DM2 (diabetes mellitus, type 2) ([place]) 01/26/2021  ? HTN (hypertension) 01/26/2021  ? ? ?Current Outpatient Medications:  ?  acetaminophen (TYLENOL) 325 MG tablet, Take 650 mg by mouth as needed., Disp: , Rfl:  ?  aspirin 81 MG EC tablet, Take 1 tablet (81 mg total) by mouth daily., Disp: 90 tablet, Rfl: 0 ?  atorvastatin (LIPITOR) 40 MG tablet, Take 1 tablet (40 mg total) by mouth daily., Disp: 90 tablet, Rfl: 1 ?  carvedilol (COREG) 6.25 MG tablet, Take 1 tablet (6.25 mg total) by mouth 2 (two) times daily with a meal., Disp: 180 tablet, Rfl: 1 ?  empagliflozin (JARDIANCE) 10 MG TABS tablet, Take 1 tablet (10 mg total) by mouth daily., Disp: 90 tablet,  Rfl: 1 ?  furosemide (LASIX) 40 MG tablet, Take 1 tablet (40 mg total) by mouth daily., Disp: 30 tablet, Rfl: 11 ?  gabapentin (NEURONTIN) 300 MG capsule, Take 1 capsule (300 mg total) by mouth 3 (three) times daily., Disp: 90 capsule, Rfl: 1 ?  hydrALAZINE (APRESOLINE) 25 MG tablet, Take 1 tablet (25 mg total) by mouth 3 (three) times daily., Disp: 90 tablet, Rfl: 5 ?  insulin glargine (LANTUS SOLOSTAR) 100 UNIT/ML Solostar Pen, Inject 20 Units into the skin daily, Disp: 6 mL, Rfl: 0 ?  Insulin Pen Needle 32G X 4 MM MISC, Use to inject insulin as directed., Disp: 100 each, Rfl: 0 ?  isosorbide mononitrate (IMDUR) 30 MG 24 hr tablet, Take 1 tablet (30 mg total) by mouth daily., Disp: 30 tablet, Rfl: 5 ?  metFORMIN (GLUCOPHAGE) 1000 MG tablet, Take 1 tablet (1,000 mg total) by mouth 2 (two) times daily with a meal., Disp: 180 tablet, Rfl: 1 ?  Multiple Vitamins-Minerals (CENTRUM MEN) TABS, Take 1 tablet by mouth daily., Disp: , Rfl:  ?  potassium chloride SA (KLOR-CON M) 20 MEQ tablet, Take 1 tablet (20 mEq total) by mouth daily., Disp: 30 tablet, Rfl: 1 ?  sacubitril-valsartan (ENTRESTO) 97-103 MG, Take 1 tablet by mouth 2 (two) times daily., Disp: 180 tablet, Rfl: 3 ?  spironolactone (ALDACTONE) 25 MG tablet, Take 1 tablet (25 mg total) by mouth  daily., Disp: 90 tablet, Rfl: 1 ?Allergies  ?Allergen Reactions  ? Lisinopril Cough  ? ? ? ?Social History  ? ?Socioeconomic History  ? Marital status: Single  ?  Spouse name: Not on file  ? Number of children: Not on file  ? Years of education: Not on file  ? Highest education level: Not on file  ?Occupational History  ? Not on file  ?Tobacco Use  ? Smoking status: Never  ? Smokeless tobacco: Never  ?Substance and Sexual Activity  ? Alcohol use: Never  ? Drug use: Never  ? Sexual activity: Not on file  ?Other Topics Concern  ? Not on file  ?Social History Narrative  ? Not on file  ? ?Social Determinants of Health  ? ?Financial Resource Strain: High Risk  ? Difficulty of  Paying Living Expenses: Very hard  ?Food Insecurity: Food Insecurity Present  ? Worried About Charity fundraiser in the Last Year: Sometimes true  ? Ran Out of Food in the Last Year: Never true  ?Transportation Needs: No Transportation Needs  ? Lack of Transportation (Medical): No  ? Lack of Transportation (Non-Medical): No  ?Physical Activity: Not on file  ?Stress: Not on file  ?Social Connections: Not on file  ?Intimate Partner Violence: Not on file  ? ? ?Physical Exam ?Vitals reviewed.  ?Constitutional:   ?   Appearance: Normal appearance. He is normal weight.  ?HENT:  ?   Head: Normocephalic.  ?   Nose: Nose normal.  ?   Mouth/Throat:  ?   Mouth: Mucous membranes are moist.  ?   Pharynx: Oropharynx is clear.  ?Eyes:  ?   Conjunctiva/sclera: Conjunctivae normal.  ?   Pupils: Pupils are equal, round, and reactive to light.  ?Cardiovascular:  ?   Rate and Rhythm: Normal rate and regular rhythm.  ?   Pulses: Normal pulses.  ?   Heart sounds: Normal heart sounds.  ?Pulmonary:  ?   Effort: Pulmonary effort is normal.  ?   Breath sounds: Normal breath sounds.  ?Abdominal:  ?   General: Abdomen is flat.  ?   Palpations: Abdomen is soft.  ?Musculoskeletal:     ?   General: No swelling. Normal range of motion.  ?   Cervical back: Normal range of motion.  ?   Right lower leg: No edema.  ?   Left lower leg: No edema.  ?Skin: ?   General: Skin is warm and dry.  ?   Capillary Refill: Capillary refill takes less than 2 seconds.  ?Neurological:  ?   General: No focal deficit present.  ?   Mental Status: He is alert. Mental status is at baseline.  ?Psychiatric:     ?   Mood and Affect: Mood normal.  ? ? ? ? ? ? ?Future Appointments  ?Date Time Provider [place]  ?09/02/2021  9:00 AM [facility] OP 1 [facility]  ?09/02/2021 10:00 AM Bensimhon, Shaune Pascal, MD [facility] None  ? ? ? ?ACTION: ?Home visit completed ? ? ? ? ? ? ?

## 2021-07-06 NOTE — Telephone Encounter (Signed)
Refill sent to pharmacy.  He is due for an office visit. ?

## 2021-07-06 NOTE — Addendum Note (Signed)
Addended byHoy Register on: 07/06/2021 05:29 PM ? ? Modules accepted: Orders ? ?

## 2021-07-07 ENCOUNTER — Other Ambulatory Visit: Payer: Self-pay | Admitting: Pharmacist

## 2021-07-07 ENCOUNTER — Other Ambulatory Visit: Payer: Self-pay

## 2021-07-07 MED ORDER — BASAGLAR KWIKPEN 100 UNIT/ML ~~LOC~~ SOPN
20.0000 [IU] | PEN_INJECTOR | Freq: Every day | SUBCUTANEOUS | 0 refills | Status: DC
  Filled 2021-07-07: qty 6, 30d supply, fill #0

## 2021-07-11 ENCOUNTER — Other Ambulatory Visit: Payer: Self-pay

## 2021-07-15 ENCOUNTER — Other Ambulatory Visit: Payer: Self-pay

## 2021-07-18 ENCOUNTER — Other Ambulatory Visit: Payer: Self-pay

## 2021-07-19 ENCOUNTER — Other Ambulatory Visit: Payer: Self-pay

## 2021-07-21 ENCOUNTER — Telehealth (HOSPITAL_COMMUNITY): Payer: Self-pay

## 2021-07-21 NOTE — Telephone Encounter (Signed)
Left message for Terry Young to set up home visit and no answer. I will follow up next week.  ?

## 2021-07-25 ENCOUNTER — Other Ambulatory Visit: Payer: Self-pay

## 2021-07-27 ENCOUNTER — Telehealth (HOSPITAL_COMMUNITY): Payer: Self-pay

## 2021-07-27 ENCOUNTER — Other Ambulatory Visit: Payer: Self-pay | Admitting: Family Medicine

## 2021-07-27 ENCOUNTER — Other Ambulatory Visit: Payer: Self-pay

## 2021-07-27 MED ORDER — GABAPENTIN 300 MG PO CAPS
300.0000 mg | ORAL_CAPSULE | Freq: Three times a day (TID) | ORAL | 0 refills | Status: DC
  Filled 2021-07-27: qty 90, 30d supply, fill #0

## 2021-07-27 NOTE — Telephone Encounter (Signed)
Left message for Terry Young to set up home paramedicine visit for tomorrow. I will continue to reach out.  ?

## 2021-07-28 ENCOUNTER — Other Ambulatory Visit: Payer: Self-pay

## 2021-07-28 ENCOUNTER — Other Ambulatory Visit (HOSPITAL_COMMUNITY): Payer: Self-pay

## 2021-07-28 NOTE — Progress Notes (Signed)
Paramedicine Encounter ? ? ? Patient ID: Terry Young, male    DOB: 07/28/1970, 51 y.o.   MRN: [id number] ? ? ?Arrived for home visit for Terry Young who reports feeling good with no complaints of chest painCleotilde Young, dizziness or shortness of breath. He has been med compliant. He does report last Thursday he had an episode of dizziness with vomiting and feels like it was his BP dropping. He checked his sugar at the time and it was normal. He said his dizziness at time worsened when he stood up. Today orthostatic vitals were checked and were normal. No dizziness reported either. I advised him to keep a log of when this happens and to report it to me or Terry Young. [facility]) He agreed with plan. I will [facility] if [facility] or [facility] clinic can assist with getting him a BP machine.  ? ?Vital and assessment as noted.  ? ?Meds reviewed. He just ran out of [facility] and [facility] informed him that he needs to apply for patient assistance for this as he is un-insured and they cannot give it to him from their pharmacy due to this and high cost. I will message HF clinic and have them set some samples to the side for him and assist with [employer] patient assistance.  ? ?We discussed his disability status and he is waiting to be approved. Terry Young cares for his disabled handicap brother who lives with him and he is his legal guardian. I advised Terry Young to apply for medicaid as he qualifies due to caring for his disabled family member and having no income. He plans to apply ASAP. I sent him link for EP[employer] and steps to apply.  ? ?Terry Young will be Terry Young community health paramedic going forward but I will continue to assist as needed.  ? ?Appointments reviewed and confirmed.  ? ?Home visit complete.  ? ? ?Patient Care Team: ?Hoy RegisterNewlin, Enobong, MD as PCP - General (Family Medicine) ? ?Patient Active Problem List  ? Diagnosis Date Noted  ? Hypokalemia 01/29/2021  ? Obesity, Class III, BMI 40-49.9 (morbid obesity) ([facility]) 01/29/2021  ? Systolic CHF,  acute on chronic ([facility]) 01/27/2021  ? Acute on chronic combined systolic and diastolic CHF (congestive heart failure) ([facility]) 01/26/2021  ? DM2 (diabetes mellitus, type 2) ([facility]) 01/26/2021  ? HTN (hypertension) 01/26/2021  ? ? ?Current Outpatient Medications:  ?  acetaminophen (TYLENOL) 325 MG tablet, Take 650 mg by mouth as needed., Disp: , Rfl:  ?  aspirin 81 MG EC tablet, Take 1 tablet (81 mg total) by mouth daily., Disp: 90 tablet, Rfl: 0 ?  atorvastatin (LIPITOR) 40 MG tablet, Take 1 tablet (40 mg total) by mouth daily., Disp: 90 tablet, Rfl: 1 ?  carvedilol (COREG) 6.25 MG tablet, Take 1 tablet (6.25 mg total) by mouth 2 (two) times daily with a meal., Disp: 180 tablet, Rfl: 1 ?  empagliflozin (JARDIANCE) 10 MG TABS tablet, Take 1 tablet (10 mg total) by mouth daily., Disp: 90 tablet, Rfl: 1 ?  furosemide (LASIX) 40 MG tablet, Take 1 tablet (40 mg total) by mouth daily., Disp: 30 tablet, Rfl: 11 ?  gabapentin (NEURONTIN) 300 MG capsule, Take 1 capsule (300 mg total) by mouth 3 (three) times daily., Disp: 90 capsule, Rfl: 0 ?  hydrALAZINE (APRESOLINE) 25 MG tablet, Take 1 tablet (25 mg total) by mouth 3 (three) times daily., Disp: 90 tablet, Rfl: 5 ?  Insulin Glargine (BASAGLAR KWIKPEN) 100 UNIT/ML, Inject 20 Units into the  skin daily., Disp: 6 mL, Rfl: 0 ?  Insulin Pen Needle 32G X 4 MM MISC, Use to inject insulin as directed., Disp: 100 each, Rfl: 0 ?  isosorbide mononitrate (IMDUR) 30 MG 24 hr tablet, Take 1 tablet (30 mg total) by mouth daily., Disp: 30 tablet, Rfl: 5 ?  metFORMIN (GLUCOPHAGE) 1000 MG tablet, Take 1 tablet (1,000 mg total) by mouth 2 (two) times daily with a meal., Disp: 180 tablet, Rfl: 1 ?  Multiple Vitamins-Minerals (CENTRUM MEN) TABS, Take 1 tablet by mouth daily., Disp: , Rfl:  ?  potassium chloride SA (KLOR-CON M) 20 MEQ tablet, Take 1 tablet (20 mEq total) by mouth daily., Disp: 30 tablet, Rfl: 1 ?  sacubitril-valsartan (ENTRESTO) 97-103 MG, Take 1 tablet by mouth 2 (two) times daily.,  Disp: 180 tablet, Rfl: 3 ?  spironolactone (ALDACTONE) 25 MG tablet, Take 1 tablet (25 mg total) by mouth daily., Disp: 90 tablet, Rfl: 1 ?Allergies  ?Allergen Reactions  ? Lisinopril Cough  ? ? ? ?Social History  ? ?Socioeconomic History  ? Marital status: Single  ?  Spouse name: Not on file  ? Number of children: Not on file  ? Years of education: Not on file  ? Highest education level: Not on file  ?Occupational History  ? Not on file  ?Tobacco Use  ? Smoking status: Never  ? Smokeless tobacco: Never  ?Substance and Sexual Activity  ? Alcohol use: Never  ? Drug use: Never  ? Sexual activity: Not on file  ?Other Topics Concern  ? Not on file  ?Social History Narrative  ? Not on file  ? ?Social Determinants of Health  ? ?Financial Resource Strain: High Risk  ? Difficulty of Paying Living Expenses: Very hard  ?Food Insecurity: Food Insecurity Present  ? Worried About Programme researcher, broadcasting/film/video in the Last Year: Sometimes true  ? Ran Out of Food in the Last Year: Never true  ?Transportation Needs: No Transportation Needs  ? Lack of Transportation (Medical): No  ? Lack of Transportation (Non-Medical): No  ?Physical Activity: Not on file  ?Stress: Not on file  ?Social Connections: Not on file  ?Intimate Partner Violence: Not on file  ? ? ?Physical Exam ?Vitals reviewed.  ?Constitutional:   ?   Appearance: Normal appearance. He is normal weight.  ?HENT:  ?   Head: Normocephalic.  ?   Nose: Nose normal.  ?   Mouth/Throat:  ?   Mouth: Mucous membranes are moist.  ?   Pharynx: Oropharynx is clear.  ?Eyes:  ?   Pupils: Pupils are equal, round, and reactive to light.  ?Cardiovascular:  ?   Rate and Rhythm: Normal rate and regular rhythm.  ?   Pulses: Normal pulses.  ?   Heart sounds: Normal heart sounds.  ?Pulmonary:  ?   Effort: Pulmonary effort is normal.  ?   Breath sounds: Normal breath sounds.  ?Abdominal:  ?   General: Abdomen is flat.  ?   Palpations: Abdomen is soft.  ?Musculoskeletal:     ?   General: No swelling. Normal  range of motion.  ?   Cervical back: Normal range of motion.  ?   Right lower leg: No edema.  ?   Left lower leg: No edema.  ?Skin: ?   General: Skin is warm and dry.  ?   Capillary Refill: Capillary refill takes less than 2 seconds.  ?Neurological:  ?   General: No focal deficit present.  ?  Mental Status: He is alert. Mental status is at baseline.  ?Psychiatric:     ?   Mood and Affect: Mood normal.  ? ? ? ? ? ? ?Future Appointments  ?Date Time Provider Department Center  ?08/02/2021 10:30 AM Hoy Register, MD [facility] None  ?09/02/2021  9:00 AM [facility] OP 1 [facility]  ?09/02/2021 10:00 AM Bensimhon, Bevelyn Buckles, MD [facility] None  ? ? ? ?ACTION: ?Home visit completed ? ? ? ? ? ? ?

## 2021-07-29 ENCOUNTER — Other Ambulatory Visit (HOSPITAL_COMMUNITY): Payer: Self-pay | Admitting: *Deleted

## 2021-07-29 ENCOUNTER — Other Ambulatory Visit (HOSPITAL_COMMUNITY): Payer: Self-pay

## 2021-07-29 DIAGNOSIS — I5043 Acute on chronic combined systolic (congestive) and diastolic (congestive) heart failure: Secondary | ICD-10-CM

## 2021-07-29 DIAGNOSIS — E1165 Type 2 diabetes mellitus with hyperglycemia: Secondary | ICD-10-CM

## 2021-07-29 MED ORDER — HYDRALAZINE HCL 25 MG PO TABS
25.0000 mg | ORAL_TABLET | Freq: Three times a day (TID) | ORAL | 3 refills | Status: DC
  Filled 2021-07-29 – 2021-08-25 (×2): qty 90, 30d supply, fill #0
  Filled 2021-09-29: qty 90, 30d supply, fill #1
  Filled 2021-11-29: qty 90, 30d supply, fill #2
  Filled 2022-01-01: qty 90, 30d supply, fill #3

## 2021-07-29 MED ORDER — ISOSORBIDE MONONITRATE ER 30 MG PO TB24
30.0000 mg | ORAL_TABLET | Freq: Every day | ORAL | 3 refills | Status: DC
  Filled 2021-07-29 – 2021-08-25 (×2): qty 30, 30d supply, fill #0
  Filled 2021-09-29: qty 30, 30d supply, fill #1
  Filled 2021-10-31: qty 30, 30d supply, fill #2
  Filled 2021-11-29: qty 30, 30d supply, fill #3

## 2021-07-29 MED ORDER — EMPAGLIFLOZIN 10 MG PO TABS
10.0000 mg | ORAL_TABLET | Freq: Every day | ORAL | 0 refills | Status: DC
  Filled 2021-07-29: qty 30, 30d supply, fill #0

## 2021-07-29 MED ORDER — POTASSIUM CHLORIDE CRYS ER 20 MEQ PO TBCR
20.0000 meq | EXTENDED_RELEASE_TABLET | Freq: Every day | ORAL | 3 refills | Status: DC
  Filled 2021-07-29: qty 30, 30d supply, fill #0

## 2021-07-29 MED ORDER — FUROSEMIDE 40 MG PO TABS
40.0000 mg | ORAL_TABLET | Freq: Every day | ORAL | 3 refills | Status: DC
  Filled 2021-07-29 – 2021-08-25 (×2): qty 30, 30d supply, fill #0
  Filled 2021-09-29: qty 30, 30d supply, fill #1
  Filled 2021-10-31: qty 30, 30d supply, fill #2
  Filled 2021-11-29: qty 30, 30d supply, fill #3

## 2021-07-29 MED ORDER — CARVEDILOL 6.25 MG PO TABS
6.2500 mg | ORAL_TABLET | Freq: Two times a day (BID) | ORAL | 3 refills | Status: DC
  Filled 2021-07-29 – 2021-08-25 (×2): qty 60, 30d supply, fill #0
  Filled 2021-09-29: qty 60, 30d supply, fill #1
  Filled 2021-10-31: qty 60, 30d supply, fill #2
  Filled 2021-11-29: qty 60, 30d supply, fill #3

## 2021-07-29 MED ORDER — ATORVASTATIN CALCIUM 40 MG PO TABS
40.0000 mg | ORAL_TABLET | Freq: Every day | ORAL | 3 refills | Status: DC
  Filled 2021-07-29 – 2021-08-25 (×2): qty 30, 30d supply, fill #0
  Filled 2021-09-29: qty 30, 30d supply, fill #1
  Filled 2021-10-31: qty 30, 30d supply, fill #2
  Filled 2021-11-29: qty 30, 30d supply, fill #3

## 2021-07-29 MED ORDER — SPIRONOLACTONE 25 MG PO TABS
25.0000 mg | ORAL_TABLET | Freq: Every day | ORAL | 3 refills | Status: DC
  Filled 2021-07-29 – 2021-08-25 (×2): qty 30, 30d supply, fill #0
  Filled 2021-09-29: qty 30, 30d supply, fill #1
  Filled 2021-10-31: qty 30, 30d supply, fill #2
  Filled 2021-11-29: qty 30, 30d supply, fill #3

## 2021-08-01 ENCOUNTER — Telehealth (HOSPITAL_COMMUNITY): Payer: Self-pay | Admitting: Licensed Clinical Social Worker

## 2021-08-01 NOTE — Telephone Encounter (Signed)
Pt identified to need BP cuff at home and is unable to afford one at this time. ? ?BP cuff purchased through Medical Equipment program and sent to pt home ? ?Will continue to follow and assist as needed ? ?Burna Sis, LCSW ?Clinical Social Worker ?[facility] ?Desk#: [phone] ?Cell#: [phone] ? ?

## 2021-08-02 ENCOUNTER — Other Ambulatory Visit: Payer: Self-pay

## 2021-08-02 ENCOUNTER — Other Ambulatory Visit (HOSPITAL_COMMUNITY): Payer: Self-pay

## 2021-08-02 ENCOUNTER — Other Ambulatory Visit (HOSPITAL_COMMUNITY): Payer: Self-pay | Admitting: *Deleted

## 2021-08-02 ENCOUNTER — Ambulatory Visit: Payer: Self-pay | Attending: Family Medicine | Admitting: Family Medicine

## 2021-08-02 ENCOUNTER — Encounter: Payer: Self-pay | Admitting: Family Medicine

## 2021-08-02 VITALS — BP 149/90 | HR 92 | Ht 70.0 in | Wt 276.4 lb

## 2021-08-02 DIAGNOSIS — E1165 Type 2 diabetes mellitus with hyperglycemia: Secondary | ICD-10-CM

## 2021-08-02 DIAGNOSIS — Z794 Long term (current) use of insulin: Secondary | ICD-10-CM

## 2021-08-02 DIAGNOSIS — M79641 Pain in right hand: Secondary | ICD-10-CM

## 2021-08-02 DIAGNOSIS — I5042 Chronic combined systolic (congestive) and diastolic (congestive) heart failure: Secondary | ICD-10-CM

## 2021-08-02 DIAGNOSIS — Z1211 Encounter for screening for malignant neoplasm of colon: Secondary | ICD-10-CM

## 2021-08-02 DIAGNOSIS — M79642 Pain in left hand: Secondary | ICD-10-CM

## 2021-08-02 LAB — POCT GLYCOSYLATED HEMOGLOBIN (HGB A1C): HbA1c, POC (controlled diabetic range): 7.2 % — AB (ref 0.0–7.0)

## 2021-08-02 LAB — GLUCOSE, POCT (MANUAL RESULT ENTRY): POC Glucose: 139 mg/dl — AB (ref 70–99)

## 2021-08-02 MED ORDER — EMPAGLIFLOZIN 10 MG PO TABS
10.0000 mg | ORAL_TABLET | Freq: Every day | ORAL | 3 refills | Status: DC
  Filled 2021-08-02: qty 30, 30d supply, fill #0

## 2021-08-02 MED ORDER — BASAGLAR KWIKPEN 100 UNIT/ML ~~LOC~~ SOPN
15.0000 [IU] | PEN_INJECTOR | Freq: Every day | SUBCUTANEOUS | 3 refills | Status: DC
  Filled 2021-08-02: qty 6, 40d supply, fill #0
  Filled 2021-09-29: qty 6, 40d supply, fill #1
  Filled 2021-11-17: qty 6, 40d supply, fill #2
  Filled 2021-12-29: qty 6, 40d supply, fill #3

## 2021-08-02 MED ORDER — DICLOFENAC SODIUM 1 % EX GEL
4.0000 g | Freq: Four times a day (QID) | CUTANEOUS | 1 refills | Status: DC
  Filled 2021-08-02: qty 100, 6d supply, fill #0

## 2021-08-02 MED ORDER — OZEMPIC (0.25 OR 0.5 MG/DOSE) 2 MG/1.5ML ~~LOC~~ SOPN
0.2500 mg | PEN_INJECTOR | SUBCUTANEOUS | 0 refills | Status: DC
  Filled 2021-08-02: qty 2, 70d supply, fill #0

## 2021-08-02 NOTE — Progress Notes (Signed)
Having pain in both hands. ?

## 2021-08-02 NOTE — Patient Instructions (Signed)
Semaglutide Injection ?What is this medication? ?SEMAGLUTIDE (SEM a GLOO tide) treats type 2 diabetes. It works by increasing insulin levels in your body, which decreases your blood sugar (glucose). It also reduces the amount of sugar released into the blood and slows down your digestion. It can also be used to lower the risk of heart attack and stroke in people with type 2 diabetes. Changes to diet and exercise are often combined with this medication. ?This medicine may be used for other purposes; ask your health care provider or pharmacist if you have questions. ?COMMON BRAND NAME(S): OZEMPIC ?What should I tell my care team before I take this medication? ?They need to know if you have any of these conditions: ?Endocrine tumors (MEN 2) or if someone in your family had these tumors ?Eye disease, vision problems ?History of pancreatitis ?Kidney disease ?Stomach problems ?Thyroid cancer or if someone in your family had thyroid cancer ?An unusual or allergic reaction to semaglutide, other medications, foods, dyes, or preservatives ?Pregnant or trying to get pregnant ?Breast-feeding ?How should I use this medication? ?This medication is for injection under the skin of your upper leg (thigh), stomach area, or upper arm. It is given once every week (every 7 days). You will be taught how to prepare and give this medication. Use exactly as directed. Take your medication at regular intervals. Do not take it more often than directed. ?If you use this medication with insulin, you should inject this medication and the insulin separately. Do not mix them together. Do not give the injections right next to each other. Change (rotate) injection sites with each injection. ?It is important that you put your used needles and syringes in a special sharps container. Do not put them in a trash can. If you do not have a sharps container, call your pharmacist or care team to get one. ?A special MedGuide will be given to you by the  pharmacist with each prescription and refill. Be sure to read this information carefully each time. ?This medication comes with INSTRUCTIONS FOR USE. Ask your pharmacist for directions on how to use this medication. Read the information carefully. Talk to your pharmacist or care team if you have questions. ?Talk to your care team about the use of this medication in children. Special care may be needed. ?Overdosage: If you think you have taken too much of this medicine contact a poison control center or emergency room at once. ?NOTE: This medicine is only for you. Do not share this medicine with others. ?What if I miss a dose? ?If you miss a dose, take it as soon as you can within 5 days after the missed dose. Then take your next dose at your regular weekly time. If it has been longer than 5 days after the missed dose, do not take the missed dose. Take the next dose at your regular time. Do not take double or extra doses. If you have questions about a missed dose, contact your care team for advice. ?What may interact with this medication? ?Other medications for diabetes ?Many medications may cause changes in blood sugar, these include: ?Alcohol containing beverages ?Antiviral medications for HIV or AIDS ?Aspirin and aspirin-like medications ?Certain medications for blood pressure, heart disease, irregular heart beat ?Chromium ?Diuretics ?Male hormones, such as estrogens or progestins, birth control pills ?Fenofibrate ?Gemfibrozil ?Isoniazid ?Lanreotide ?Male hormones or anabolic steroids ?MAOIs like Carbex, Eldepryl, Marplan, Nardil, and Parnate ?Medications for weight loss ?Medications for allergies, asthma, cold, or cough ?Medications for depression,   anxiety, or psychotic disturbances ?Niacin ?Nicotine ?NSAIDs, medications for pain and inflammation, like ibuprofen or naproxen ?Octreotide ?Pasireotide ?Pentamidine ?Phenytoin ?Probenecid ?Quinolone antibiotics such as ciprofloxacin, levofloxacin, ofloxacin ?Some  herbal dietary supplements ?Steroid medications such as prednisone or cortisone ?Sulfamethoxazole; trimethoprim ?Thyroid hormones ?Some medications can hide the warning symptoms of low blood sugar (hypoglycemia). You may need to monitor your blood sugar more closely if you are taking one of these medications. These include: ?Beta-blockers, often used for high blood pressure or heart problems (examples include atenolol, metoprolol, propranolol) ?Clonidine ?Guanethidine ?Reserpine ?This list may not describe all possible interactions. Give your health care provider a list of all the medicines, herbs, non-prescription drugs, or dietary supplements you use. Also tell them if you smoke, drink alcohol, or use illegal drugs. Some items may interact with your medicine. ?What should I watch for while using this medication? ?Visit your care team for regular checks on your progress. ?Drink plenty of fluids while taking this medication. Check with your care team if you get an attack of severe diarrhea, nausea, and vomiting. The loss of too much body fluid can make it dangerous for you to take this medication. ?A test called the HbA1C (A1C) will be monitored. This is a simple blood test. It measures your blood sugar control over the last 2 to 3 months. You will receive this test every 3 to 6 months. ?Learn how to check your blood sugar. Learn the symptoms of low and high blood sugar and how to manage them. ?Always carry a quick-source of sugar with you in case you have symptoms of low blood sugar. Examples include hard sugar candy or glucose tablets. Make sure others know that you can choke if you eat or drink when you develop serious symptoms of low blood sugar, such as seizures or unconsciousness. They must get medical help at once. ?Tell your care team if you have high blood sugar. You might need to change the dose of your medication. If you are sick or exercising more than usual, you might need to change the dose of your  medication. ?Do not skip meals. Ask your care team if you should avoid alcohol. Many nonprescription cough and cold products contain sugar or alcohol. These can affect blood sugar. ?Pens should never be shared. Even if the needle is changed, sharing may result in passing of viruses like hepatitis or HIV. ?Wear a medical ID bracelet or chain, and carry a card that describes your disease and details of your medication and dosage times. ?Do not become pregnant while taking this medication. Women should inform their care team if they wish to become pregnant or think they might be pregnant. There is a potential for serious side effects to an unborn child. Talk to your care team for more information. ?What side effects may I notice from receiving this medication? ?Side effects that you should report to your care team as soon as possible: ?Allergic reactions--skin rash, itching, hives, swelling of the face, lips, tongue, or throat ?Change in vision ?Dehydration--increased thirst, dry mouth, feeling faint or lightheaded, headache, dark yellow or brown urine ?Gallbladder problems--severe stomach pain, nausea, vomiting, fever ?Heart palpitations--rapid, pounding, or irregular heartbeat ?Kidney injury--decrease in the amount of urine, swelling of the ankles, hands, or feet ?Pancreatitis--severe stomach pain that spreads to your back or gets worse after eating or when touched, fever, nausea, vomiting ?Thyroid cancer--new mass or lump in the neck, pain or trouble swallowing, trouble breathing, hoarseness ?Side effects that usually do not require medical   attention (report to your care team if they continue or are bothersome): ?Diarrhea ?Loss of appetite ?Nausea ?Stomach pain ?Vomiting ?This list may not describe all possible side effects. Call your doctor for medical advice about side effects. You may report side effects to FDA at 1-800-FDA-1088. ?Where should I keep my medication? ?Keep out of the reach of children. ?Store  unopened pens in a refrigerator between 2 and 8 degrees C (36 and 46 degrees F). Do not freeze. Protect from light and heat. After you first use the pen, it can be stored for 56 days at room temperature between 15 and

## 2021-08-02 NOTE — Progress Notes (Signed)
? ?Subjective:  ?Patient ID: Terry Young, male    DOB: 1970/07/06  Age: 51 y.o. MRN: [id number] ? ?CC: Diabetes ? ? ?HPI ?Terry Young is a 51 y.o. year old male with a history of HFrEF (EF<20%,global hypokinesis from echo 01/2021), morbid obesity, type 2 diabetes mellitus (A1c 7.2). ? ?Interval History: ?A1c is 7.2 down from 14.4 previously ?Fasting sugars have been around 135 ?Endorses sporadic hypoglycemic symptoms and sugar was 115. ?Currently on gabapentin for neuropathy but still experiences intermittent shooting pains once in a while.  Not up-to-date on annual eye exam. ? ?From a cardiac standpoint he reports doing well.  Last cardiology visit was in 05/2021. ?Denies presence of dyspnea, weight gain, pedal edema, orthopnea. ? ?He complains of pain in both hands for the last 3 months  and it feels like an achy pain worse when he tries to sleep and this started when he was hospitalized and placed on several medications.  He describes it as when one places one's hands in cold water. He is R hand dominant ?Past Medical History:  ?Diagnosis Date  ? CHF (congestive heart failure) ([place])   ? Diabetes mellitus without complication ([place])   ? Hypertension   ? ? ?History reviewed. No pertinent surgical history. ? ?Family History  ?Problem Relation Age of Onset  ? Atrial fibrillation Neg Hx   ? ? ?Social History  ? ?Socioeconomic History  ? Marital status: Single  ?  Spouse name: Not on file  ? Number of children: Not on file  ? Years of education: Not on file  ? Highest education level: Not on file  ?Occupational History  ? Not on file  ?Tobacco Use  ? Smoking status: Never  ? Smokeless tobacco: Never  ?Substance and Sexual Activity  ? Alcohol use: Never  ? Drug use: Never  ? Sexual activity: Not on file  ?Other Topics Concern  ? Not on file  ?Social History Narrative  ? Not on file  ? ?Social Determinants of Health  ? ?Financial Resource Strain: High Risk  ? Difficulty of Paying Living Expenses: Very hard  ?Food Insecurity:  Food Insecurity Present  ? Worried About Charity fundraiser in the Last Year: Sometimes true  ? Ran Out of Food in the Last Year: Never true  ?Transportation Needs: No Transportation Needs  ? Lack of Transportation (Medical): No  ? Lack of Transportation (Non-Medical): No  ?Physical Activity: Not on file  ?Stress: Not on file  ?Social Connections: Not on file  ? ? ?Allergies  ?Allergen Reactions  ? Lisinopril Cough  ? ? ?Outpatient Medications Prior to Visit  ?Medication Sig Dispense Refill  ? acetaminophen (TYLENOL) 325 MG tablet Take 650 mg by mouth as needed.    ? aspirin 81 MG EC tablet Take 1 tablet (81 mg total) by mouth daily. 90 tablet 0  ? atorvastatin (LIPITOR) 40 MG tablet Take 1 tablet (40 mg total) by mouth daily. 30 tablet 3  ? carvedilol (COREG) 6.25 MG tablet Take 1 tablet (6.25 mg total) by mouth 2 (two) times daily with a meal. 60 tablet 3  ? empagliflozin (JARDIANCE) 10 MG TABS tablet Take 1 tablet (10 mg total) by mouth daily. 30 tablet 0  ? furosemide (LASIX) 40 MG tablet Take 1 tablet (40 mg total) by mouth daily. 30 tablet 3  ? gabapentin (NEURONTIN) 300 MG capsule Take 1 capsule (300 mg total) by mouth 3 (three) times daily. 90 capsule 0  ? hydrALAZINE (APRESOLINE) 25 MG  tablet Take 1 tablet (25 mg total) by mouth 3 (three) times daily. 90 tablet 3  ? Insulin Pen Needle 32G X 4 MM MISC Use to inject insulin as directed. 100 each 0  ? isosorbide mononitrate (IMDUR) 30 MG 24 hr tablet Take 1 tablet (30 mg total) by mouth daily. 30 tablet 3  ? metFORMIN (GLUCOPHAGE) 1000 MG tablet Take 1 tablet (1,000 mg total) by mouth 2 (two) times daily with a meal. 180 tablet 1  ? Multiple Vitamins-Minerals (CENTRUM MEN) TABS Take 1 tablet by mouth daily.    ? potassium chloride SA (KLOR-CON M) 20 MEQ tablet Take 1 tablet (20 mEq total) by mouth daily. 30 tablet 3  ? sacubitril-valsartan (ENTRESTO) 97-103 MG Take 1 tablet by mouth 2 (two) times daily. 180 tablet 3  ? spironolactone (ALDACTONE) 25 MG tablet  Take 1 tablet (25 mg total) by mouth daily. 30 tablet 3  ? Insulin Glargine (BASAGLAR KWIKPEN) 100 UNIT/ML Inject 20 Units into the skin daily. 6 mL 0  ? ?No facility-administered medications prior to visit.  ? ? ? ?ROS ?Review of Systems  ?Constitutional:  Negative for activity change and appetite change.  ?HENT:  Negative for sinus pressure and sore throat.   ?Respiratory:  Negative for chest tightness, shortness of breath and wheezing.   ?Cardiovascular:  Negative for chest pain and palpitations.  ?Gastrointestinal:  Negative for abdominal distention, abdominal pain and constipation.  ?Genitourinary: Negative.   ?Musculoskeletal:   ?     See HPI  ?Neurological:  Positive for numbness.  ?Psychiatric/Behavioral:  Negative for behavioral problems and dysphoric mood.   ? ?Objective:  ?BP (!) 149/90   Pulse 92   Ht 5\' 10"  (1.778 m)   Wt 276 lb 6.4 oz (125.4 kg)   SpO2 99%   BMI 39.66 kg/m?  ? ? ?  08/02/2021  ? 10:39 AM 07/28/2021  ?  5:46 PM 07/06/2021  ?  3:30 PM  ?BP/Weight  ?Systolic BP 149 120 140  ?Diastolic BP 90 98 90  ?Wt. (Lbs) 276.4 270 271  ?BMI 39.66 kg/m2 38.74 kg/m2 38.88 kg/m2  ? ? ? ? ?Physical Exam ?Constitutional:   ?   Appearance: He is well-developed. He is obese.  ?Cardiovascular:  ?   Rate and Rhythm: Normal rate.  ?   Heart sounds: Normal heart sounds. No murmur heard. ?Pulmonary:  ?   Effort: Pulmonary effort is normal.  ?   Breath sounds: Normal breath sounds. No wheezing or rales.  ?Chest:  ?   Chest wall: No tenderness.  ?Abdominal:  ?   General: Bowel sounds are normal. There is no distension.  ?   Palpations: Abdomen is soft. There is no mass.  ?   Tenderness: There is no abdominal tenderness.  ?Musculoskeletal:     ?   General: Normal range of motion.  ?   Right lower leg: No edema.  ?   Left lower leg: No edema.  ?   Comments: Normal appearance of both hands, able to make a fist bilaterally ?Negative Phalen and Tinel signs  ?Neurological:  ?   Mental Status: He is alert and oriented  to person, place, and time.  ?Psychiatric:     ?   Mood and Affect: Mood normal.  ? ? ? ?  Latest Ref Rng & Units 06/16/2021  ?  9:31 AM 05/31/2021  ? 10:39 AM 05/13/2021  ? 10:18 AM  ?CMP  ?Glucose 70 - 99 mg/dL 932  145   140    ?BUN 6 - 20 mg/dL 17   17   14     ?Creatinine 0.61 - 1.24 mg/dL 1.25   1.61   1.35    ?Sodium 135 - 145 mmol/L 139   138   137    ?Potassium 3.5 - 5.1 mmol/L 4.1   3.9   3.5    ?Chloride 98 - 111 mmol/L 103   102   102    ?CO2 22 - 32 mmol/L 26   25   26     ?Calcium 8.9 - 10.3 mg/dL 9.2   8.8   9.1    ? ? ?Lipid Panel  ?No results found for: CHOL, TRIG, HDL, CHOLHDL, VLDL, LDLCALC, LDLDIRECT ? ?CBC ?   ?Component Value Date/Time  ? WBC 9.0 01/27/2021 0153  ? RBC 5.53 01/27/2021 0153  ? HGB 16.4 01/27/2021 0153  ? HCT 49.8 01/27/2021 0153  ? PLT 215 01/27/2021 0153  ? MCV 90.1 01/27/2021 0153  ? MCH 29.7 01/27/2021 0153  ? MCHC 32.9 01/27/2021 0153  ? RDW 13.2 01/27/2021 0153  ? LYMPHSABS 1.5 01/26/2021 1655  ? MONOABS 0.6 01/26/2021 1655  ? EOSABS 0.1 01/26/2021 1655  ? BASOSABS 0.1 01/26/2021 1655  ? ? ?Lab Results  ?Component Value Date  ? HGBA1C 7.2 (A) 08/02/2021  ? ? ?Assessment & Plan:  ?1. Type 2 diabetes mellitus with hyperglycemia, with long-term current use of insulin ([place]) ?Commended on significant improvement with A1c of 7.2 down from 14.4 previously ?Goal A1c is less than 7.0 ?We will add on GLP-1 RA due to cardiovascular and weight loss benefit with a goal of titrating up at his next visit with the clinical pharmacist to achieve greatest weight benefits. ?I have reduced Basaglar from 20 units to 15 units and at his next visit this can also be decreased further as we uptitrate his Ozempic ?Counseled on Diabetic diet, my plate method, [id number] minutes of moderate intensity exercise/week ?Blood sugar logs with fasting goals of 80-120 mg/dl, random of less than 180 and in the event of sugars less than 60 mg/dl or greater than 400 mg/dl encouraged to notify the clinic. ?Advised on the  need for annual eye exams, annual foot exams, Pneumonia vaccine. ?- POCT glucose (manual entry) ?- POCT glycosylated hemoglobin (Hb A1C) ?- Semaglutide,0.25 or 0.5MG /DOS, (OZEMPIC, 0.25 OR 0.5 MG/DOSE,) 2 MG/1

## 2021-08-03 ENCOUNTER — Telehealth: Payer: Self-pay | Admitting: Pharmacist

## 2021-08-03 ENCOUNTER — Other Ambulatory Visit: Payer: Self-pay

## 2021-08-03 LAB — MAGNESIUM: Magnesium: 1.9 mg/dL (ref 1.6–2.3)

## 2021-08-03 LAB — POTASSIUM: Potassium: 4.6 mmol/L (ref 3.5–5.2)

## 2021-08-03 NOTE — Telephone Encounter (Signed)
Patient has been noncompliant with completing needed patient assistance forms. Ozempic added to his regimen recently for weight loss and CV benefit. I attempted to call him twice yesterday and once today with no answer. I spoke with Tresa Endo in our pharmacy and we cannot file his needed paperwork to get Ozempic approved until he comes by our pharmacy. Once he does, it usually takes ~6 weeks to get Ozempic approved. Therefore, we would need to adjust his insulin in the interim to betting control his blood sugar until he receives the Ozempic. His A1c was 7.2 yesterday. I left a detailed VM explaining that we may need to adjust his insulin until he receives his Ozempic. Waiting on a call-back. Of note, I attempted to contact his sister as she is listed as a patient contact and received an automated message that her phone is no longer in service.  ?

## 2021-08-17 ENCOUNTER — Other Ambulatory Visit (HOSPITAL_COMMUNITY): Payer: Self-pay | Admitting: Emergency Medicine

## 2021-08-17 NOTE — Progress Notes (Signed)
Paramedicine Encounter ? ? ? Patient ID: Terry Young, male    DOB: [birth year], 51 y.o.   MRN: [id number] ? ?CBG 141 ? ?Made an unscheduled home visit with Mr. Lazar today as I have not been able to reach him by phone call or text which is highly unusual.  Pt. States that his sister has been sick and he has been in [place] taking care of her and he had left his phone at home and that's why I had been unable to reach him.  Pt denies chest pain or SOB.  Lung sounds clear and equal bilaterally.  No peripheral edema noted.  Pt does well in taking his medications and is able to repeat doses of each without difficulty.   ?Mr. Larrabee states that he has been prescribed Ozempic but that it is too expensive at this time and Franky Macho with [employer] is working on getting him assistance with same.   ?Mr. Guymon states he has no other specific needs at this time.  I advised him to contact me when he hears back from his application for Medicaid.    ? ?There were no vitals taken for this visit. ?Weight yesterday-271 lbs ?Last visit weight-276 ? ?Patient Care Team: ?Hoy Register, MD as PCP - General (Family Medicine) ? ?Patient Active Problem List  ? Diagnosis Date Noted  ? Hypokalemia 01/29/2021  ? Obesity, Class III, BMI 40-49.9 (morbid obesity) ([facility]) 01/29/2021  ? Systolic CHF, acute on chronic ([facility]) 01/27/2021  ? Acute on chronic combined systolic and diastolic CHF (congestive heart failure) ([facility]) 01/26/2021  ? DM2 (diabetes mellitus, type 2) ([facility]) 01/26/2021  ? HTN (hypertension) 01/26/2021  ? ? ?Current Outpatient Medications:  ?  acetaminophen (TYLENOL) 325 MG tablet, Take 650 mg by mouth as needed., Disp: , Rfl:  ?  aspirin 81 MG EC tablet, Take 1 tablet (81 mg total) by mouth daily., Disp: 90 tablet, Rfl: 0 ?  atorvastatin (LIPITOR) 40 MG tablet, Take 1 tablet (40 mg total) by mouth daily., Disp: 30 tablet, Rfl: 3 ?  carvedilol (COREG) 6.25 MG tablet, Take 1 tablet (6.25 mg total) by mouth 2 (two) times daily  with a meal., Disp: 60 tablet, Rfl: 3 ?  diclofenac Sodium (VOLTAREN) 1 % GEL, Apply 4 g topically 4 (four) times daily., Disp: 100 g, Rfl: 1 ?  empagliflozin (JARDIANCE) 10 MG TABS tablet, Take 1 tablet (10 mg total) by mouth daily., Disp: 30 tablet, Rfl: 3 ?  furosemide (LASIX) 40 MG tablet, Take 1 tablet (40 mg total) by mouth daily., Disp: 30 tablet, Rfl: 3 ?  gabapentin (NEURONTIN) 300 MG capsule, Take 1 capsule (300 mg total) by mouth 3 (three) times daily., Disp: 90 capsule, Rfl: 0 ?  hydrALAZINE (APRESOLINE) 25 MG tablet, Take 1 tablet (25 mg total) by mouth 3 (three) times daily., Disp: 90 tablet, Rfl: 3 ?  Insulin Glargine (BASAGLAR KWIKPEN) 100 UNIT/ML, Inject 15 Units into the skin daily., Disp: 6 mL, Rfl: 3 ?  Insulin Pen Needle 32G X 4 MM MISC, Use to inject insulin as directed., Disp: 100 each, Rfl: 0 ?  isosorbide mononitrate (IMDUR) 30 MG 24 hr tablet, Take 1 tablet (30 mg total) by mouth daily., Disp: 30 tablet, Rfl: 3 ?  metFORMIN (GLUCOPHAGE) 1000 MG tablet, Take 1 tablet (1,000 mg total) by mouth 2 (two) times daily with a meal., Disp: 180 tablet, Rfl: 1 ?  Multiple Vitamins-Minerals (CENTRUM MEN) TABS, Take 1 tablet by mouth daily., Disp: ,  Rfl:  ?  potassium chloride SA (KLOR-CON M) 20 MEQ tablet, Take 1 tablet (20 mEq total) by mouth daily., Disp: 30 tablet, Rfl: 3 ?  sacubitril-valsartan (ENTRESTO) 97-103 MG, Take 1 tablet by mouth 2 (two) times daily., Disp: 180 tablet, Rfl: 3 ?  spironolactone (ALDACTONE) 25 MG tablet, Take 1 tablet (25 mg total) by mouth daily., Disp: 30 tablet, Rfl: 3 ?  empagliflozin (JARDIANCE) 10 MG TABS tablet, Take 1 tablet (10 mg total) by mouth daily. (Patient not taking: Reported on 08/17/2021), Disp: 30 tablet, Rfl: 0 ?  Semaglutide,0.25 or 0.5MG /DOS, (OZEMPIC, 0.25 OR 0.5 MG/DOSE,) 2 MG/1.5ML SOPN, Inject 0.25 mg into the skin once a week. For 4 weeks then increase to 0.5 mg/week (Patient not taking: Reported on 08/17/2021), Disp: 2 mL, Rfl: 0 ?Allergies  ?Allergen  Reactions  ? Lisinopril Cough  ? ? ? ? ?Social History  ? ?Socioeconomic History  ? Marital status: Single  ?  Spouse name: Not on file  ? Number of children: Not on file  ? Years of education: Not on file  ? Highest education level: Not on file  ?Occupational History  ? Not on file  ?Tobacco Use  ? Smoking status: Never  ? Smokeless tobacco: Never  ?Substance and Sexual Activity  ? Alcohol use: Never  ? Drug use: Never  ? Sexual activity: Not on file  ?Other Topics Concern  ? Not on file  ?Social History Narrative  ? Not on file  ? ?Social Determinants of Health  ? ?Financial Resource Strain: High Risk  ? Difficulty of Paying Living Expenses: Very hard  ?Food Insecurity: Food Insecurity Present  ? Worried About Programme researcher, broadcasting/film/video in the Last Year: Sometimes true  ? Ran Out of Food in the Last Year: Never true  ?Transportation Needs: No Transportation Needs  ? Lack of Transportation (Medical): No  ? Lack of Transportation (Non-Medical): No  ?Physical Activity: Not on file  ?Stress: Not on file  ?Social Connections: Not on file  ?Intimate Partner Violence: Not on file  ? ? ?Physical Exam ? ? ? ? ? ?Future Appointments  ?Date Time Provider Department Center  ?09/02/2021  9:00 AM [facility] 1 [facility]  ?09/02/2021 10:00 AM Bensimhon, Bevelyn Buckles, MD [facility] None  ? ? ? ? ? ?Beatrix Shipper, EMT-Paramedic ?[phone] ?[facility]  ?08/17/21  ?

## 2021-08-17 NOTE — Progress Notes (Signed)
Created this encounter in error.   ?

## 2021-08-23 ENCOUNTER — Telehealth (HOSPITAL_COMMUNITY): Payer: Self-pay | Admitting: Emergency Medicine

## 2021-08-23 NOTE — Telephone Encounter (Signed)
Called @ 11:10 today to schedule appointment.  No answer, left voice mail. ?

## 2021-08-25 ENCOUNTER — Other Ambulatory Visit: Payer: Self-pay

## 2021-08-25 ENCOUNTER — Other Ambulatory Visit (HOSPITAL_COMMUNITY): Payer: Self-pay | Admitting: Internal Medicine

## 2021-08-25 ENCOUNTER — Other Ambulatory Visit: Payer: Self-pay | Admitting: Internal Medicine

## 2021-08-25 ENCOUNTER — Other Ambulatory Visit (HOSPITAL_COMMUNITY): Payer: Self-pay

## 2021-08-25 DIAGNOSIS — E1165 Type 2 diabetes mellitus with hyperglycemia: Secondary | ICD-10-CM

## 2021-08-26 ENCOUNTER — Other Ambulatory Visit (HOSPITAL_COMMUNITY): Payer: Self-pay | Admitting: Emergency Medicine

## 2021-08-26 ENCOUNTER — Other Ambulatory Visit: Payer: Self-pay

## 2021-08-26 ENCOUNTER — Other Ambulatory Visit (HOSPITAL_COMMUNITY): Payer: Self-pay

## 2021-08-26 MED ORDER — GABAPENTIN 300 MG PO CAPS
300.0000 mg | ORAL_CAPSULE | Freq: Three times a day (TID) | ORAL | 0 refills | Status: DC
  Filled 2021-08-26: qty 90, 30d supply, fill #0

## 2021-08-26 MED ORDER — EMPAGLIFLOZIN 10 MG PO TABS
10.0000 mg | ORAL_TABLET | Freq: Every day | ORAL | 0 refills | Status: DC
  Filled 2021-08-26: qty 30, 30d supply, fill #0

## 2021-08-26 NOTE — Telephone Encounter (Signed)
Requested Prescriptions  ?Pending Prescriptions Disp Refills  ?? gabapentin (NEURONTIN) 300 MG capsule 90 capsule 0  ?  Sig: Take 1 capsule (300 mg total) by mouth 3 (three) times daily.  ?  ? Neurology: Anticonvulsants - gabapentin Failed - 08/25/2021  5:13 PM  ?  ?  Failed - Cr in normal range and within 360 days  ?  Creatinine, Ser  ?Date Value Ref Range Status  ?06/16/2021 1.25 (H) 0.61 - 1.24 mg/dL Final  ?   ?  ?  Passed - Completed PHQ-2 or PHQ-9 in the last 360 days  ?  ?  Passed - Valid encounter within last 12 months  ?  Recent Outpatient Visits   ?      ? 3 weeks ago Type 2 diabetes mellitus with hyperglycemia, with long-term current use of insulin ([place])  ? Parker, Charlane Ferretti, MD  ? 6 months ago Type 2 diabetes mellitus with hyperglycemia, with long-term current use of insulin ([place])  ? [place] Charlott Rakes, MD  ?  ?  ? ?  ?  ?  ? ? ?

## 2021-08-30 NOTE — Progress Notes (Signed)
Paramedicine Encounter ? ? ? Patient ID: Terry Young, male    DOB: 1970-12-30, 51 y.o.   MRN: [id number] ? ?Made a drop in home visit with Terry Young today as I have not been able to reach him by phone or text.  Terry Young reports to be doing well today.  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  Terry Young denies chest pain or SOB.  No edema to his lower extremities.  Lung sounds clear and equal bilaterally.  Terry Young does complain of intermittent  "soreness to my hands".  He is currently using Diclofenac gel 1% for this issue prescribed for his PCP but says he can't tell that it is helping.  There was no redness or rash noted to his hands and distal PMS is intact.  Reviewed medications w/ pt and everything seems to be going well. ? ?BP 120/90 (BP Location: Left Arm, Patient Position: Sitting, Cuff Size: Normal)   Pulse 99   Resp 16   Wt 275 lb (124.7 kg)   SpO2 98%   BMI 39.46 kg/m?  ?CBG 140 ?Weight yesterday-275 lbs  ?Last visit weight-271 lbs ? ?Patient Care Team: ?Hoy Register, MD as PCP - General (Family Medicine) ? ?Patient Active Problem List  ? Diagnosis Date Noted  ? Hypokalemia 01/29/2021  ? Obesity, Class III, BMI 40-49.9 (morbid obesity) (HCC) 01/29/2021  ? Systolic CHF, acute on chronic (HCC) 01/27/2021  ? Acute on chronic combined systolic and diastolic CHF (congestive heart failure) (HCC) 01/26/2021  ? DM2 (diabetes mellitus, type 2) (HCC) 01/26/2021  ? HTN (hypertension) 01/26/2021  ? ? ?Current Outpatient Medications:  ?  acetaminophen (TYLENOL) 325 MG tablet, Take 650 mg by mouth as needed., Disp: , Rfl:  ?  aspirin 81 MG EC tablet, Take 1 tablet (81 mg total) by mouth daily., Disp: 90 tablet, Rfl: 0 ?  atorvastatin (LIPITOR) 40 MG tablet, Take 1 tablet (40 mg total) by mouth daily., Disp: 30 tablet, Rfl: 3 ?  carvedilol (COREG) 6.25 MG tablet, Take 1 tablet (6.25 mg total) by mouth 2 (two) times daily  with a meal., Disp: 60 tablet, Rfl: 3 ?  diclofenac Sodium (VOLTAREN) 1 % GEL, Apply 4 g topically 4 (four) times daily., Disp: 100 g, Rfl: 1 ?  empagliflozin (JARDIANCE) 10 MG TABS tablet, Take 1 tablet (10 mg total) by mouth daily., Disp: 30 tablet, Rfl: 0 ?  furosemide (LASIX) 40 MG tablet, Take 1 tablet (40 mg total) by mouth daily., Disp: 30 tablet, Rfl: 3 ?  gabapentin (NEURONTIN) 300 MG capsule, Take 1 capsule (300 mg total) by mouth 3 (three) times daily., Disp: 90 capsule, Rfl: 0 ?  hydrALAZINE (APRESOLINE) 25 MG tablet, Take 1 tablet (25 mg total) by mouth 3 (three) times daily., Disp: 90 tablet, Rfl: 3 ?  Insulin Glargine (BASAGLAR KWIKPEN) 100 UNIT/ML, Inject 15 Units into the skin daily., Disp: 6 mL, Rfl: 3 ?  Insulin Pen Needle 32G X 4 MM MISC, Use to inject insulin as directed., Disp: 100 each, Rfl: 0 ?  isosorbide mononitrate (IMDUR) 30 MG 24 hr tablet, Take 1 tablet (30 mg total) by mouth daily., Disp: 30 tablet, Rfl: 3 ?  metFORMIN (GLUCOPHAGE) 1000 MG tablet, Take 1 tablet (1,000 mg total) by mouth 2 (two) times daily with a meal., Disp: 180 tablet, Rfl: 1 ?  Multiple Vitamins-Minerals (CENTRUM MEN) TABS, Take 1  tablet by mouth daily., Disp: , Rfl:  ?  potassium chloride SA (KLOR-CON M) 20 MEQ tablet, Take 1 tablet (20 mEq total) by mouth daily., Disp: 30 tablet, Rfl: 3 ?  sacubitril-valsartan (ENTRESTO) 97-103 MG, Take 1 tablet by mouth 2 (two) times daily., Disp: 180 tablet, Rfl: 3 ?  Semaglutide,0.25 or 0.5MG /DOS, (OZEMPIC, 0.25 OR 0.5 MG/DOSE,) 2 MG/1.5ML SOPN, Inject 0.25 mg into the skin once a week. For 4 weeks then increase to 0.5 mg/week (Patient not taking: Reported on 08/17/2021), Disp: 2 mL, Rfl: 0 ?  spironolactone (ALDACTONE) 25 MG tablet, Take 1 tablet (25 mg total) by mouth daily., Disp: 30 tablet, Rfl: 3 ?Allergies  ?Allergen Reactions  ? Lisinopril Cough  ? ? ? ? ?Social History  ? ?Socioeconomic History  ? Marital status: Single  ?  Spouse name: Not on file  ? Number of children:  Not on file  ? Years of education: Not on file  ? Highest education level: Not on file  ?Occupational History  ? Not on file  ?Tobacco Use  ? Smoking status: Never  ? Smokeless tobacco: Never  ?Substance and Sexual Activity  ? Alcohol use: Never  ? Drug use: Never  ? Sexual activity: Not on file  ?Other Topics Concern  ? Not on file  ?Social History Narrative  ? Not on file  ? ?Social Determinants of Health  ? ?Financial Resource Strain: High Risk  ? Difficulty of Paying Living Expenses: Very hard  ?Food Insecurity: Food Insecurity Present  ? Worried About Programme researcher, broadcasting/film/video in the Last Year: Sometimes true  ? Ran Out of Food in the Last Year: Never true  ?Transportation Needs: No Transportation Needs  ? Lack of Transportation (Medical): No  ? Lack of Transportation (Non-Medical): No  ?Physical Activity: Not on file  ?Stress: Not on file  ?Social Connections: Not on file  ?Intimate Partner Violence: Not on file  ? ? ?Physical Exam ? ? ? ? ? ?Future Appointments  ?Date Time Provider Department Center  ?09/02/2021  9:00 AM [facility] OP 1 [facility]  ?09/02/2021 10:00 AM Bensimhon, Bevelyn Buckles, MD [facility] None  ? ? ? ? ? ?Beatrix Shipper, EMT-Paramedic ?[phone] ?[facility] Par[facility]  ?08/30/21  ?

## 2021-09-02 ENCOUNTER — Encounter (HOSPITAL_COMMUNITY): Payer: Self-pay | Admitting: Internal Medicine

## 2021-09-02 ENCOUNTER — Ambulatory Visit (HOSPITAL_COMMUNITY): Payer: Self-pay

## 2021-09-13 ENCOUNTER — Other Ambulatory Visit: Payer: Self-pay

## 2021-09-15 ENCOUNTER — Telehealth (HOSPITAL_COMMUNITY): Payer: Self-pay | Admitting: Emergency Medicine

## 2021-09-15 NOTE — Telephone Encounter (Signed)
Will contact on Tuesday to schedule visit.

## 2021-09-20 ENCOUNTER — Telehealth (HOSPITAL_COMMUNITY): Payer: Self-pay | Admitting: Emergency Medicine

## 2021-09-20 NOTE — Telephone Encounter (Signed)
Called to schedule home visit. No answer.  Sent text for him to call me back.  This usually is most effective way to reach him.

## 2021-09-22 ENCOUNTER — Other Ambulatory Visit (HOSPITAL_COMMUNITY): Payer: Self-pay | Admitting: Emergency Medicine

## 2021-09-22 NOTE — Progress Notes (Signed)
Paramedicine Encounter    Patient ID: Terry Young, male    DOB: 1971/01/31, 51 y.o.   MRN: [id number]  Terry Young was his normal jovial self.   He has recently traveled to see [place] in [place] and [place].  He reports he has had no chest pain or SOB.  Lung sounds clear and equal bilat.  He does complain that he had some increased swelling in his legs which he correlates with riding for long periods in the car for his trip.  He has some edema in his right ankle and the left ankle was unremarkable.  He does say his right foot swelling is improving daily.  He is hypertensive today @ 140/110.  All medications reviewed and he is able to confirm correct dosages for all his meds.   I phoned [facility] and spoke with Meredith Staggers and she advised that for the time being just have him check his pressures daily.  I advised him to reach out to [employer] or Florentina Addison if he should need anything before Tuesday of next week.  I also advised him to obviously call 911 should he have an emergency.  Will follow up with him on Tuesday 6/12.  BP (!) 140/110 (BP Location: Left Arm, Patient Position: Sitting, Cuff Size: Normal)   Pulse 80   Resp 16   Wt 274 lb (124.3 kg)   SpO2 99%   BMI 39.31 kg/m  Weight yesterday-274lb Last visit weight-275lb  Patient Care Team: Hoy Register, MD as PCP - General (Family Medicine)  Patient Active Problem List   Diagnosis Date Noted   Hypokalemia 01/29/2021   Obesity, Class III, BMI 40-49.9 (morbid obesity) (HCC) 01/29/2021   Systolic CHF, acute on chronic (HCC) 01/27/2021   Acute on chronic combined systolic and diastolic CHF (congestive heart failure) (HCC) 01/26/2021   DM2 (diabetes mellitus, type 2) (HCC) 01/26/2021   HTN (hypertension) 01/26/2021    Current Outpatient Medications:    acetaminophen (TYLENOL) 325 MG tablet, Take 650 mg by mouth as needed., Disp: , Rfl:    aspirin 81 MG EC tablet, Take 1 tablet (81 mg total) by mouth daily., Disp: 90 tablet, Rfl: 0    atorvastatin (LIPITOR) 40 MG tablet, Take 1 tablet (40 mg total) by mouth daily., Disp: 30 tablet, Rfl: 3   carvedilol (COREG) 6.25 MG tablet, Take 1 tablet (6.25 mg total) by mouth 2 (two) times daily with a meal., Disp: 60 tablet, Rfl: 3   diclofenac Sodium (VOLTAREN) 1 % GEL, Apply 4 g topically 4 (four) times daily., Disp: 100 g, Rfl: 1   empagliflozin (JARDIANCE) 10 MG TABS tablet, Take 1 tablet (10 mg total) by mouth daily., Disp: 30 tablet, Rfl: 0   furosemide (LASIX) 40 MG tablet, Take 1 tablet (40 mg total) by mouth daily., Disp: 30 tablet, Rfl: 3   gabapentin (NEURONTIN) 300 MG capsule, Take 1 capsule (300 mg total) by mouth 3 (three) times daily., Disp: 90 capsule, Rfl: 0   hydrALAZINE (APRESOLINE) 25 MG tablet, Take 1 tablet (25 mg total) by mouth 3 (three) times daily., Disp: 90 tablet, Rfl: 3   Insulin Glargine (BASAGLAR KWIKPEN) 100 UNIT/ML, Inject 15 Units into the skin daily., Disp: 6 mL, Rfl: 3   Insulin Pen Needle 32G X 4 MM MISC, Use to inject insulin as directed., Disp: 100 each, Rfl: 0   isosorbide mononitrate (IMDUR) 30 MG 24 hr tablet, Take 1 tablet (30 mg total) by mouth daily., Disp: 30 tablet, Rfl: 3  metFORMIN (GLUCOPHAGE) 1000 MG tablet, Take 1 tablet (1,000 mg total) by mouth 2 (two) times daily with a meal., Disp: 180 tablet, Rfl: 1   Multiple Vitamins-Minerals (CENTRUM MEN) TABS, Take 1 tablet by mouth daily., Disp: , Rfl:    potassium chloride SA (KLOR-CON M) 20 MEQ tablet, Take 1 tablet (20 mEq total) by mouth daily., Disp: 30 tablet, Rfl: 3   sacubitril-valsartan (ENTRESTO) 97-103 MG, Take 1 tablet by mouth 2 (two) times daily., Disp: 180 tablet, Rfl: 3   Semaglutide,0.25 or 0.5MG /DOS, (OZEMPIC, 0.25 OR 0.5 MG/DOSE,) 2 MG/1.5ML SOPN, Inject 0.25 mg into the skin once a week. For 4 weeks then increase to 0.5 mg/week (Patient not taking: Reported on 08/17/2021), Disp: 2 mL, Rfl: 0   spironolactone (ALDACTONE) 25 MG tablet, Take 1 tablet (25 mg total) by mouth daily.,  Disp: 30 tablet, Rfl: 3 Allergies  Allergen Reactions   Lisinopril Cough      Social History   Socioeconomic History   Marital status: Single    Spouse name: Not on file   Number of children: Not on file   Years of education: Not on file   Highest education level: Not on file  Occupational History   Not on file  Tobacco Use   Smoking status: Never   Smokeless tobacco: Never  Substance and Sexual Activity   Alcohol use: Never   Drug use: Never   Sexual activity: Not on file  Other Topics Concern   Not on file  Social History Narrative   Not on file   Social Determinants of Health   Financial Resource Strain: High Risk (02/07/2021)   Overall Financial Resource Strain (CARDIA)    Difficulty of Paying Living Expenses: Very hard  Food Insecurity: Food Insecurity Present (06/29/2021)   Hunger Vital Sign    Worried About Running Out of Food in the Last Year: Sometimes true    Ran Out of Food in the Last Year: Never true  Transportation Needs: No Transportation Needs (02/07/2021)   PRAPARE - Administrator, Civil Service (Medical): No    Lack of Transportation (Non-Medical): No  Physical Activity: Not on file  Stress: Not on file  Social Connections: Not on file  Intimate Partner Violence: Not on file    Physical Exam      Future Appointments  Date Time Provider Department Center  12/14/2021  1:00 PM [facility] ECHO OP 1 [facility]  12/14/2021  2:00 PM Bensimhon, Bevelyn Buckles, MD [facility] None       Beatrix Shipper, EMT-Paramedic [phone] [facility] Paramedic  09/22/21

## 2021-09-29 ENCOUNTER — Other Ambulatory Visit: Payer: Self-pay

## 2021-09-29 ENCOUNTER — Other Ambulatory Visit (HOSPITAL_COMMUNITY): Payer: Self-pay

## 2021-10-04 ENCOUNTER — Other Ambulatory Visit (HOSPITAL_COMMUNITY): Payer: Self-pay

## 2021-10-04 ENCOUNTER — Other Ambulatory Visit: Payer: Self-pay

## 2021-10-06 ENCOUNTER — Other Ambulatory Visit: Payer: Self-pay

## 2021-10-06 ENCOUNTER — Telehealth: Payer: Self-pay

## 2021-10-06 NOTE — Telephone Encounter (Signed)
[facility]IANCE **WENT AHEAD AND SUBMITTED THE PATIENT ASSISTANCE APPLICATION ON BEHALF OF PATIENT TODAY TO [employer] [facility] WITHOUT PROOF OF INCOME.  PROOF OF INCOME WAS REQUESTED ON 07/25/21 BUT PATIENT HAS BEEN GETTING MEDS FILLED AT [facility] THROUGH THE CHF FUND SINCE THEN.

## 2021-10-10 ENCOUNTER — Other Ambulatory Visit: Payer: Self-pay | Admitting: Internal Medicine

## 2021-10-10 ENCOUNTER — Other Ambulatory Visit (HOSPITAL_COMMUNITY): Payer: Self-pay | Admitting: Internal Medicine

## 2021-10-10 DIAGNOSIS — E1165 Type 2 diabetes mellitus with hyperglycemia: Secondary | ICD-10-CM

## 2021-10-11 ENCOUNTER — Other Ambulatory Visit: Payer: Self-pay

## 2021-10-11 ENCOUNTER — Other Ambulatory Visit (HOSPITAL_COMMUNITY): Payer: Self-pay

## 2021-10-11 MED ORDER — EMPAGLIFLOZIN 10 MG PO TABS
10.0000 mg | ORAL_TABLET | Freq: Every day | ORAL | 3 refills | Status: DC
  Filled 2021-10-11: qty 30, 30d supply, fill #0
  Filled 2021-11-06: qty 30, 30d supply, fill #1
  Filled 2021-12-09: qty 30, 30d supply, fill #2
  Filled 2022-01-05: qty 30, 30d supply, fill #3
  Filled 2022-02-02 – 2022-02-08 (×2): qty 30, 30d supply, fill #4

## 2021-10-14 ENCOUNTER — Other Ambulatory Visit (HOSPITAL_COMMUNITY): Payer: Self-pay | Admitting: Emergency Medicine

## 2021-10-14 NOTE — Progress Notes (Unsigned)
Paramedicine Encounter    Patient ID: Terry Young, male    DOB: 12-29-1970, 51 y.o.   MRN: [id number]   There were no vitals taken for this visit. Weight yesterday-275lb Last visit weight-274lb  Patient Care Team: Hoy Register, MD as PCP - General (Family Medicine)  Patient Active Problem List   Diagnosis Date Noted   Hypokalemia 01/29/2021   Obesity, Class III, BMI 40-49.9 (morbid obesity) (HCC) 01/29/2021   Systolic CHF, acute on chronic (HCC) 01/27/2021   Acute on chronic combined systolic and diastolic CHF (congestive heart failure) (HCC) 01/26/2021   DM2 (diabetes mellitus, type 2) (HCC) 01/26/2021   HTN (hypertension) 01/26/2021    Current Outpatient Medications:    acetaminophen (TYLENOL) 325 MG tablet, Take 650 mg by mouth as needed., Disp: , Rfl:    aspirin 81 MG EC tablet, Take 1 tablet (81 mg total) by mouth daily., Disp: 90 tablet, Rfl: 0   atorvastatin (LIPITOR) 40 MG tablet, Take 1 tablet (40 mg total) by mouth daily., Disp: 30 tablet, Rfl: 3   carvedilol (COREG) 6.25 MG tablet, Take 1 tablet (6.25 mg total) by mouth 2 (two) times daily with a meal., Disp: 60 tablet, Rfl: 3   diclofenac Sodium (VOLTAREN) 1 % GEL, Apply 4 g topically 4 (four) times daily. (Patient not taking: Reported on 09/22/2021), Disp: 100 g, Rfl: 1   empagliflozin (JARDIANCE) 10 MG TABS tablet, Take 1 tablet (10 mg total) by mouth daily., Disp: 90 tablet, Rfl: 3   furosemide (LASIX) 40 MG tablet, Take 1 tablet (40 mg total) by mouth daily., Disp: 30 tablet, Rfl: 3   gabapentin (NEURONTIN) 300 MG capsule, Take 1 capsule (300 mg total) by mouth 3 (three) times daily., Disp: 90 capsule, Rfl: 0   hydrALAZINE (APRESOLINE) 25 MG tablet, Take 1 tablet (25 mg total) by mouth 3 (three) times daily., Disp: 90 tablet, Rfl: 3   Insulin Glargine (BASAGLAR KWIKPEN) 100 UNIT/ML, Inject 15 Units into the skin daily., Disp: 6 mL, Rfl: 3   Insulin Pen Needle 32G X 4 MM MISC, Use to inject insulin as directed., Disp:  100 each, Rfl: 0   isosorbide mononitrate (IMDUR) 30 MG 24 hr tablet, Take 1 tablet (30 mg total) by mouth daily., Disp: 30 tablet, Rfl: 3   metFORMIN (GLUCOPHAGE) 1000 MG tablet, Take 1 tablet (1,000 mg total) by mouth 2 (two) times daily with a meal., Disp: 180 tablet, Rfl: 1   Multiple Vitamins-Minerals (CENTRUM MEN) TABS, Take 1 tablet by mouth daily., Disp: , Rfl:    potassium chloride SA (KLOR-CON M) 20 MEQ tablet, Take 1 tablet (20 mEq total) by mouth daily., Disp: 30 tablet, Rfl: 3   sacubitril-valsartan (ENTRESTO) 97-103 MG, Take 1 tablet by mouth 2 (two) times daily., Disp: 180 tablet, Rfl: 3   Semaglutide,0.25 or 0.5MG /DOS, (OZEMPIC, 0.25 OR 0.5 MG/DOSE,) 2 MG/1.5ML SOPN, Inject 0.25 mg into the skin once a week. For 4 weeks then increase to 0.5 mg/week (Patient not taking: Reported on 08/17/2021), Disp: 2 mL, Rfl: 0   spironolactone (ALDACTONE) 25 MG tablet, Take 1 tablet (25 mg total) by mouth daily., Disp: 30 tablet, Rfl: 3 Allergies  Allergen Reactions   Lisinopril Cough      Social History   Socioeconomic History   Marital status: Single    Spouse name: Not on file   Number of children: Not on file   Years of education: Not on file   Highest education level: Not on file  Occupational History  Not on file  Tobacco Use   Smoking status: Never   Smokeless tobacco: Never  Substance and Sexual Activity   Alcohol use: Never   Drug use: Never   Sexual activity: Not on file  Other Topics Concern   Not on file  Social History Narrative   Not on file   Social Determinants of Health   Financial Resource Strain: High Risk (02/07/2021)   Overall Financial Resource Strain (CARDIA)    Difficulty of Paying Living Expenses: Very hard  Food Insecurity: Food Insecurity Present (06/29/2021)   Hunger Vital Sign    Worried About Running Out of Food in the Last Year: Sometimes true    Ran Out of Food in the Last Year: Never true  Transportation Needs: No Transportation Needs  (02/07/2021)   PRAPARE - Administrator, Civil Service (Medical): No    Lack of Transportation (Non-Medical): No  Physical Activity: Not on file  Stress: Not on file  Social Connections: Not on file  Intimate Partner Violence: Not on file    Physical Exam      Future Appointments  Date Time Provider Department Center  12/14/2021  1:00 PM [facility] ECHO OP 1 [facility]  12/14/2021  2:00 PM Bensimhon, Bevelyn Buckles, MD [facility] None       Beatrix Shipper, EMT-Paramedic [phone] [facility] Paramedic  10/14/21

## 2021-10-19 ENCOUNTER — Other Ambulatory Visit: Payer: Self-pay

## 2021-10-24 ENCOUNTER — Other Ambulatory Visit: Payer: Self-pay

## 2021-10-25 ENCOUNTER — Other Ambulatory Visit: Payer: Self-pay

## 2021-10-26 ENCOUNTER — Other Ambulatory Visit: Payer: Self-pay

## 2021-10-31 ENCOUNTER — Other Ambulatory Visit: Payer: Self-pay

## 2021-10-31 MED ORDER — GABAPENTIN 300 MG PO CAPS
300.0000 mg | ORAL_CAPSULE | Freq: Three times a day (TID) | ORAL | 0 refills | Status: DC
  Filled 2021-10-31: qty 90, 30d supply, fill #0

## 2021-11-01 ENCOUNTER — Other Ambulatory Visit (HOSPITAL_COMMUNITY): Payer: Self-pay

## 2021-11-02 ENCOUNTER — Telehealth (HOSPITAL_COMMUNITY): Payer: Self-pay | Admitting: Licensed Clinical Social Worker

## 2021-11-02 ENCOUNTER — Other Ambulatory Visit (HOSPITAL_COMMUNITY): Payer: Self-pay

## 2021-11-02 NOTE — Telephone Encounter (Signed)
HF Paramedicine Team Based Care Meeting  HF MD- NA  HF NP - Amy Clegg NP-C   [facility] HF Paramedicine  Terry Young  [facility] admit within the last 30 days for heart failure? no  Medications concerns? Has good understanding of his medications  Education needs? Is aware of when he should contact clinic regarding health concerns.  SDOH -no  Eligible for discharge? Feel as if pt is ready for DC- paramedic to discuss with pt  Burna Sis, LCSW Clinical Social Worker [facility] Desk#: [phone] Cell#: [phone]

## 2021-11-07 ENCOUNTER — Other Ambulatory Visit (HOSPITAL_COMMUNITY): Payer: Self-pay

## 2021-11-07 ENCOUNTER — Other Ambulatory Visit: Payer: Self-pay

## 2021-11-18 ENCOUNTER — Other Ambulatory Visit: Payer: Self-pay

## 2021-11-18 ENCOUNTER — Other Ambulatory Visit (HOSPITAL_COMMUNITY): Payer: Self-pay

## 2021-11-29 ENCOUNTER — Other Ambulatory Visit: Payer: Self-pay | Admitting: Family Medicine

## 2021-11-30 ENCOUNTER — Other Ambulatory Visit: Payer: Self-pay

## 2021-11-30 ENCOUNTER — Other Ambulatory Visit (HOSPITAL_COMMUNITY): Payer: Self-pay

## 2021-11-30 MED ORDER — GABAPENTIN 300 MG PO CAPS
300.0000 mg | ORAL_CAPSULE | Freq: Three times a day (TID) | ORAL | 0 refills | Status: DC
  Filled 2021-11-30: qty 90, 30d supply, fill #0

## 2021-12-07 ENCOUNTER — Other Ambulatory Visit (HOSPITAL_COMMUNITY): Payer: Self-pay | Admitting: Emergency Medicine

## 2021-12-07 NOTE — Progress Notes (Signed)
Paramedicine Encounter    Patient ID: Terry Young, male    DOB: March 07, 1971, 51 y.o.   MRN: [id number]   BP (!) 130/100 (BP Location: Left Arm, Patient Position: Sitting, Cuff Size: Normal)   Pulse 87   Resp 16   Wt 274 lb (124.3 kg)   SpO2 99%   BMI 39.31 kg/m  Weight yesterday-275lb  Last visit weight-275lb  Met with Terry Young today for a final discharge visit.  He reports to be feeling well w/ no chest pain or SOB.  He has no edema noted to his extremities.  No obvious JVD.  Lung sounds clear and equal bilat.  We reviewed his med list which he is very fluent with.  He does say that he doesn't always take his meds in a timely fashion.  He is aware of the importance of taking meds at the same time daily and he say he will work to do better.  I advised him that he was being discharged from the program as he has proven to be able to function independently with his meds, appointments, nutrition, etc.  I reminded him he has an upcoming appointment with Dr. Haroldine Laws @ 2:00 9/30 and he was aware of same.   I reassured him that I would be available for him in the future should he have further needs. Visit complete.  Renee Ramus, [place] 12/07/2021     Patient Care Team: Charlott Rakes, MD as PCP - General (Family Medicine)  Patient Active Problem List   Diagnosis Date Noted   Hypokalemia 01/29/2021   Obesity, Class III, BMI 40-49.9 (morbid obesity) ([place]) 46/80/3212   Systolic CHF, acute on chronic ([place]) 01/27/2021   Acute on chronic combined systolic and diastolic CHF (congestive heart failure) ([place]) 01/26/2021   DM2 (diabetes mellitus, type 2) ([place]) 01/26/2021   HTN (hypertension) 01/26/2021    Current Outpatient Medications:    acetaminophen (TYLENOL) 325 MG tablet, Take 650 mg by mouth as needed., Disp: , Rfl:    aspirin 81 MG EC tablet, Take 1 tablet (81 mg total) by mouth daily., Disp: 90 tablet, Rfl: 0   atorvastatin (LIPITOR) 40 MG tablet, Take 1 tablet (40  mg total) by mouth daily., Disp: 30 tablet, Rfl: 3   carvedilol (COREG) 6.25 MG tablet, Take 1 tablet (6.25 mg total) by mouth 2 (two) times daily with a meal., Disp: 60 tablet, Rfl: 3   empagliflozin (JARDIANCE) 10 MG TABS tablet, Take 1 tablet (10 mg total) by mouth daily., Disp: 90 tablet, Rfl: 3   furosemide (LASIX) 40 MG tablet, Take 1 tablet (40 mg total) by mouth daily., Disp: 30 tablet, Rfl: 3   gabapentin (NEURONTIN) 300 MG capsule, Take 1 capsule (300 mg total) by mouth 3 (three) times daily., Disp: 90 capsule, Rfl: 0   hydrALAZINE (APRESOLINE) 25 MG tablet, Take 1 tablet (25 mg total) by mouth 3 (three) times daily., Disp: 90 tablet, Rfl: 3   Insulin Glargine (BASAGLAR KWIKPEN) 100 UNIT/ML, Inject 15 Units into the skin daily., Disp: 6 mL, Rfl: 3   Insulin Pen Needle 32G X 4 MM MISC, Use to inject insulin as directed., Disp: 100 each, Rfl: 0   isosorbide mononitrate (IMDUR) 30 MG 24 hr tablet, Take 1 tablet (30 mg total) by mouth daily., Disp: 30 tablet, Rfl: 3   metFORMIN (GLUCOPHAGE) 1000 MG tablet, Take 1 tablet (1,000 mg total) by mouth 2 (two) times daily with a meal., Disp: 180 tablet, Rfl: 1   Multiple Vitamins-Minerals (  CENTRUM MEN) TABS, Take 1 tablet by mouth daily., Disp: , Rfl:    potassium chloride SA (KLOR-CON M) 20 MEQ tablet, Take 1 tablet (20 mEq total) by mouth daily., Disp: 30 tablet, Rfl: 3   sacubitril-valsartan (ENTRESTO) 97-103 MG, Take 1 tablet by mouth 2 (two) times daily., Disp: 180 tablet, Rfl: 3   spironolactone (ALDACTONE) 25 MG tablet, Take 1 tablet (25 mg total) by mouth daily., Disp: 30 tablet, Rfl: 3   diclofenac Sodium (VOLTAREN) 1 % GEL, Apply 4 g topically 4 (four) times daily. (Patient not taking: Reported on 09/22/2021), Disp: 100 g, Rfl: 1   Semaglutide,0.25 or 0.5MG /DOS, (OZEMPIC, 0.25 OR 0.5 MG/DOSE,) 2 MG/1.5ML SOPN, Inject 0.25 mg into the skin once a week. For 4 weeks then increase to 0.5 mg/week (Patient not taking: Reported on 08/17/2021), Disp: 2  mL, Rfl: 0 Allergies  Allergen Reactions   Lisinopril Cough      Social History   Socioeconomic History   Marital status: Single    Spouse name: Not on file   Number of children: Not on file   Years of education: Not on file   Highest education level: Not on file  Occupational History   Not on file  Tobacco Use   Smoking status: Never   Smokeless tobacco: Never  Substance and Sexual Activity   Alcohol use: Never   Drug use: Never   Sexual activity: Not on file  Other Topics Concern   Not on file  Social History Narrative   Not on file   Social Determinants of Health   Financial Resource Strain: High Risk (02/07/2021)   Overall Financial Resource Strain (CARDIA)    Difficulty of Paying Living Expenses: Very hard  Food Insecurity: Food Insecurity Present (06/29/2021)   Hunger Vital Sign    Worried About Running Out of Food in the Last Year: Sometimes true    Ran Out of Food in the Last Year: Never true  Transportation Needs: No Transportation Needs (02/07/2021)   PRAPARE - Hydrologist (Medical): No    Lack of Transportation (Non-Medical): No  Physical Activity: Not on file  Stress: Not on file  Social Connections: Not on file  Intimate Partner Violence: Not on file    Physical Exam      Future Appointments  Date Time Provider [place]  12/14/2021  1:00 PM [facility] ECHO OP 1 [facility]  12/14/2021  2:00 PM Bensimhon, Shaune Pascal, MD [place] None       Renee Ramus, [place] Paramedic  12/07/21

## 2021-12-09 ENCOUNTER — Other Ambulatory Visit (HOSPITAL_COMMUNITY): Payer: Self-pay

## 2021-12-09 ENCOUNTER — Other Ambulatory Visit: Payer: Self-pay

## 2021-12-09 ENCOUNTER — Other Ambulatory Visit: Payer: Self-pay | Admitting: Family Medicine

## 2021-12-09 DIAGNOSIS — Z794 Long term (current) use of insulin: Secondary | ICD-10-CM

## 2021-12-09 MED ORDER — METFORMIN HCL 1000 MG PO TABS
1000.0000 mg | ORAL_TABLET | Freq: Two times a day (BID) | ORAL | 0 refills | Status: DC
  Filled 2021-12-09 – 2021-12-29 (×2): qty 180, 90d supply, fill #0

## 2021-12-09 NOTE — Telephone Encounter (Signed)
Requested Prescriptions  Pending Prescriptions Disp Refills  . metFORMIN (GLUCOPHAGE) 1000 MG tablet 180 tablet 1    Sig: Take 1 tablet (1,000 mg total) by mouth 2 (two) times daily with a meal.     Endocrinology:  Diabetes - Biguanides Failed - 12/09/2021 12:42 AM      Failed - Cr in normal range and within 360 days    Creatinine, Ser  Date Value Ref Range Status  06/16/2021 1.25 (H) 0.61 - 1.24 mg/dL Final         Failed - B12 Level in normal range and within 720 days    No results found for: "VITAMINB12"       Passed - HBA1C is between 0 and 7.9 and within 180 days    HbA1c, POC (controlled diabetic range)  Date Value Ref Range Status  08/02/2021 7.2 (A) 0.0 - 7.0 % Final         Passed - eGFR in normal range and within 360 days    GFR, Estimated  Date Value Ref Range Status  06/16/2021 >60 >60 mL/min Final    Comment:    (NOTE) Calculated using the CKD-EPI Creatinine Equation (2021)    eGFR  Date Value Ref Range Status  02/28/2021 58 (L) >59 mL/min/1.73 Final         Passed - Valid encounter within last 6 months    Recent Outpatient Visits          4 months ago Type 2 diabetes mellitus with hyperglycemia, with long-term current use of insulin ([place])   [place], [place], MD   9 months ago Type 2 diabetes mellitus with hyperglycemia, with long-term current use of insulin ([place])   [place], [place]on, MD             Passed - CBC within normal limits and completed in the last 12 months    WBC  Date Value Ref Range Status  01/27/2021 9.0 4.0 - 10.5 K/uL Final   RBC  Date Value Ref Range Status  01/27/2021 5.53 4.22 - 5.81 MIL/uL Final   Hemoglobin  Date Value Ref Range Status  01/27/2021 16.4 13.0 - 17.0 g/dL Final   HCT  Date Value Ref Range Status  01/27/2021 49.8 39.0 - 52.0 % Final   MCHC  Date Value Ref Range Status  01/27/2021 32.9 30.0 - 36.0 g/dL Final   [facility]  Date  Value Ref Range Status  01/27/2021 29.7 26.0 - 34.0 pg Final   MCV  Date Value Ref Range Status  01/27/2021 90.1 80.0 - 100.0 fL Final   No results found for: "PLTCOUNTKUC", "LABPLAT", "POCPLA" RDW  Date Value Ref Range Status  01/27/2021 13.2 11.5 - 15.5 % Final

## 2021-12-14 ENCOUNTER — Encounter (HOSPITAL_COMMUNITY): Payer: Self-pay | Admitting: Internal Medicine

## 2021-12-14 ENCOUNTER — Ambulatory Visit (HOSPITAL_BASED_OUTPATIENT_CLINIC_OR_DEPARTMENT_OTHER)
Admission: RE | Admit: 2021-12-14 | Discharge: 2021-12-14 | Disposition: A | Discharge status: Home / Self Care | Payer: Self-pay | Source: Ambulatory Visit | Attending: Internal Medicine | Admitting: Internal Medicine

## 2021-12-14 ENCOUNTER — Ambulatory Visit (HOSPITAL_COMMUNITY)
Admission: RE | Admit: 2021-12-14 | Discharge: 2021-12-14 | Disposition: A | Discharge status: Home / Self Care | Payer: Self-pay | Source: Ambulatory Visit | Attending: Family Medicine | Admitting: Family Medicine

## 2021-12-14 VITALS — BP 108/70 | HR 93 | Wt 282.2 lb

## 2021-12-14 DIAGNOSIS — E669 Obesity, unspecified: Secondary | ICD-10-CM | POA: Insufficient documentation

## 2021-12-14 DIAGNOSIS — Z794 Long term (current) use of insulin: Secondary | ICD-10-CM

## 2021-12-14 DIAGNOSIS — I5082 Biventricular heart failure: Secondary | ICD-10-CM | POA: Insufficient documentation

## 2021-12-14 DIAGNOSIS — Z79899 Other long term (current) drug therapy: Secondary | ICD-10-CM | POA: Insufficient documentation

## 2021-12-14 DIAGNOSIS — I5023 Acute on chronic systolic (congestive) heart failure: Secondary | ICD-10-CM

## 2021-12-14 DIAGNOSIS — Z6841 Body Mass Index (BMI) 40.0 and over, adult: Secondary | ICD-10-CM | POA: Insufficient documentation

## 2021-12-14 DIAGNOSIS — Z7984 Long term (current) use of oral hypoglycemic drugs: Secondary | ICD-10-CM | POA: Insufficient documentation

## 2021-12-14 DIAGNOSIS — E1165 Type 2 diabetes mellitus with hyperglycemia: Secondary | ICD-10-CM

## 2021-12-14 DIAGNOSIS — I110 Hypertensive heart disease with heart failure: Secondary | ICD-10-CM | POA: Insufficient documentation

## 2021-12-14 DIAGNOSIS — I10 Essential (primary) hypertension: Secondary | ICD-10-CM

## 2021-12-14 DIAGNOSIS — E119 Type 2 diabetes mellitus without complications: Secondary | ICD-10-CM | POA: Insufficient documentation

## 2021-12-14 LAB — BASIC METABOLIC PANEL
Anion gap: 9 (ref 5–15)
BUN: 28 mg/dL — ABNORMAL HIGH (ref 6–20)
CO2: 23 mmol/L (ref 22–32)
Calcium: 9.2 mg/dL (ref 8.9–10.3)
Chloride: 106 mmol/L (ref 98–111)
Creatinine, Ser: 1.89 mg/dL — ABNORMAL HIGH (ref 0.61–1.24)
GFR, Estimated: 43 mL/min — ABNORMAL LOW (ref >60)
Glucose, Bld: 113 mg/dL — ABNORMAL HIGH (ref 70–99)
Potassium: 5 mmol/L (ref 3.5–5.1)
Sodium: 138 mmol/L (ref 135–145)

## 2021-12-14 LAB — ECHOCARDIOGRAM COMPLETE
S' Lateral: 3.7 cm
Single Plane A4C EF: 44.4 %
Weight: 4515.2 oz

## 2021-12-14 LAB — BRAIN NATRIURETIC PEPTIDE: B Natriuretic Peptide: 21.2 pg/mL (ref 0.0–100.0)

## 2021-12-14 NOTE — Patient Instructions (Signed)
There has been no changes to your medications.  Labs done today, your results will be available in MyChart, we will contact you for abnormal readings.  If your blood pressure remains <110 cut your entresto in half   If you have any questions or concerns before your next appointment please send [place] a message through [place] or call our office at [phone].    TO LEAVE A MESSAGE FOR THE NURSE SELECT OPTION 2, PLEASE LEAVE A MESSAGE INCLUDING: YOUR NAME DATE OF BIRTH CALL BACK NUMBER REASON FOR CALL**this is important as we prioritize the call backs  YOU WILL RECEIVE A CALL BACK THE SAME DAY AS LONG AS YOU CALL BEFORE 4:00 PM  At the [facility], you and your health needs are our priority. As part of our continuing mission to provide you with exceptional heart care, we have created designated Provider Care Teams. These Care Teams include your primary Cardiologist (physician) and Advanced Practice Providers (APPs- Physician Assistants and Nurse Practitioners) who all work together to provide you with the care you need, when you need it.   You may see any of the following providers on your designated Care Team at your next follow up: Dr Arvilla Meres Dr Marca Ancona Dr. Marcos Eke, NP Robbie Lis, [place] [facility] [place], [place] Brynda Peon, NP Karle Plumber, PharmD   Please be sure to bring in all your medications bottles to every appointment.

## 2021-12-14 NOTE — Progress Notes (Signed)
ADVANCED HF CLINIC CONSULT NOTE  Referring Physician: Darrick Grinder, NP Primary Care: Charlott Rakes, MD HF Cardiologist: Dr. Haroldine Laws  HPI: Terry Young is a 51 y.o. male w/ chronic biventricular systolic heart failure, HTN, poorly controlled DM2, HL and obesity.   Previously followed at [facility]. Diagnosed w/ CHF ~2015 though no records are available during that time. He recalls being diagnosed w/ CHF shortly after a bout w/ a viral illness, which he believes was flu. Echo 2018 w/ EF 40-45%. NST showed no ischemia. CM felt likely NICM from uncontrolled HTN, per notes from [facility]. He has never had a LHC. Uncertain if he has had a cMRI.    Admitted 10/22 to [facility] for a/c CHF and seen by Dr. Terrence Dupont. Echo w/ severe biventricular dysfunction. LVEF down to <20%, RV severely reduced w/ global HK, GIIDD, no LVH, moderately elevated RVSP 52 mmHg w/ mod TR. Only mild Terry. He was diuresed w/ IV Lasix and placed on GDMT. On Jardiance, Entresto, Spiro and Coreg. Discharge wt 297 lb.  Hgb A1c 14.    He was seen in the HF [facility] in October 2022. Entresto was increased 97-103 mg bid and lasix was cut back to 40 mg daily. Follow up in [facility] 1/23 and out of all meds x 2 weeks except Entresto. Meds restarted and referred to paramedicine.   Today he returns for HF follow up. Feels good. Denies SOB, CP. Not overly active. Gets dizzy with Entresto at first. Says SBP will drop 115-20 at that time. Otherwise BP 120-140. No edema.     Echo today 12/14/21 EF 55-60% G1DD    Cardiac Testing    - Care Everywhere ([facility])--> Echo (2018): EF 40-45%  - Echo (01/27/21): EF < 20% Grade II DD RV severely reduced   - Care Everywhere [facility]) Myoview (2018)>> No ischemia    - Echo (01/27/21): EF < 20% Grade II DD RV severely reduced   Past Medical History:  Diagnosis Date   CHF (congestive heart failure) (HCC)    Diabetes mellitus without complication (HCC)    Hypertension    Current Outpatient Medications  Medication Sig Dispense  Refill   acetaminophen (TYLENOL) 325 MG tablet Take 650 mg by mouth as needed.     aspirin 81 MG EC tablet Take 1 tablet (81 mg total) by mouth daily. 90 tablet 0   atorvastatin (LIPITOR) 40 MG tablet Take 1 tablet (40 mg total) by mouth daily. 30 tablet 3   carvedilol (COREG) 6.25 MG tablet Take 1 tablet (6.25 mg total) by mouth 2 (two) times daily with a meal. 60 tablet 3   empagliflozin (JARDIANCE) 10 MG TABS tablet Take 1 tablet (10 mg total) by mouth daily. 90 tablet 3   furosemide (LASIX) 40 MG tablet Take 1 tablet (40 mg total) by mouth daily. 30 tablet 3   gabapentin (NEURONTIN) 300 MG capsule Take 1 capsule (300 mg total) by mouth 3 (three) times daily. 90 capsule 0   hydrALAZINE (APRESOLINE) 25 MG tablet Take 1 tablet (25 mg total) by mouth 3 (three) times daily. 90 tablet 3   Insulin Glargine (BASAGLAR KWIKPEN) 100 UNIT/ML Inject 15 Units into the skin daily. 6 mL 3   Insulin Pen Needle 32G X 4 MM MISC Use to inject insulin as directed. 100 each 0   isosorbide mononitrate (IMDUR) 30 MG 24 hr tablet Take 1 tablet (30 mg total) by mouth daily. 30 tablet 3   metFORMIN (GLUCOPHAGE) 1000 MG tablet Take 1 tablet (  1,000 mg total) by mouth 2 (two) times daily with a meal. 180 tablet 0   Multiple Vitamins-Minerals (CENTRUM MEN) TABS Take 1 tablet by mouth daily.     potassium chloride SA (KLOR-CON M) 20 MEQ tablet Take 1 tablet (20 mEq total) by mouth daily. 30 tablet 3   sacubitril-valsartan (ENTRESTO) 97-103 MG Take 1 tablet by mouth 2 (two) times daily. 180 tablet 3   spironolactone (ALDACTONE) 25 MG tablet Take 1 tablet (25 mg total) by mouth daily. 30 tablet 3   Semaglutide,0.25 or 0.5MG /DOS, (OZEMPIC, 0.25 OR 0.5 MG/DOSE,) 2 MG/1.5ML SOPN Inject 0.25 mg into the skin once a week. For 4 weeks then increase to 0.5 mg/week (Patient not taking: Reported on 08/17/2021) 2 mL 0   No current facility-administered medications for this encounter.   Allergies  Allergen Reactions   Lisinopril Cough    Social History   Socioeconomic History   Marital status: Single    Spouse name: Not on file   Number of children: Not on file   Years of education: Not on file   Highest education level: Not on file  Occupational History   Not on file  Tobacco Use   Smoking status: Never   Smokeless tobacco: Never  Substance and Sexual Activity   Alcohol use: Never   Drug use: Never   Sexual activity: Not on file  Other Topics Concern   Not on file  Social History Narrative   Not on file   Social Determinants of Health   Financial Resource Strain: High Risk (02/07/2021)   Overall Financial Resource Strain (CARDIA)    Difficulty of Paying Living Expenses: Very hard  Food Insecurity: Food Insecurity Present (06/29/2021)   Hunger Vital Sign    Worried About Running Out of Food in the Last Year: Sometimes true    Ran Out of Food in the Last Year: Never true  Transportation Needs: No Transportation Needs (02/07/2021)   PRAPARE - Administrator, Civil Service (Medical): No    Lack of Transportation (Non-Medical): No  Physical Activity: Not on file  Stress: Not on file  Social Connections: Not on file  Intimate Partner Violence: Not on file   Family History  Problem Relation Age of Onset   Atrial fibrillation Neg Hx    Family h/o is notable for CHF, CAD and Afib (father).   BP 108/70   Pulse 93   Wt 128 kg (282 lb 3.2 oz)   SpO2 99%   BMI 40.49 kg/m   Wt Readings from Last 3 Encounters:  12/14/21 128 kg (282 lb 3.2 oz)  12/07/21 124.3 kg (274 lb)  10/14/21 124.7 kg (275 lb)   PHYSICAL EXAM: General:  Well appearing. No resp difficulty HEENT: normal Neck: supple. no JVD. Carotids 2+ bilat; no bruits. No lymphadenopathy or thryomegaly appreciated. Cor: PMI nondisplaced. Regular rate & rhythm. No rubs, gallops or murmurs. Lungs: clear Abdomen: obese soft, nontender, nondistended. No hepatosplenomegaly. No bruits or masses. Good bowel sounds. Extremities: no  cyanosis, clubbing, rash, edema Neuro: alert & orientedx3, cranial nerves grossly intact. moves all 4 extremities w/o difficulty. Affect pleasant  ECG NSR 93 IVCD Personally reviewed  ASSESSMENT & PLAN: 1. Chronic Biventricular Systolic Heart Failure   - presumed NICM, likely hypertensive CM but cannot r/o viral CM  - Echo 2018 ([facility]) EF 40-45%, NST low risk. Has never had LHC nor cMRI  - Echo 10/22 EF <20%, RV severely reduced. - Echo today 12/14/21 EF 55-60% G1DD -  EF recovered NYHA II. Volume ok   - Continue Jardiance 10 mg daily.  - Continue Entresto 97-103 mg bid. - Continue Lasix 40 mg q AM/20  mg q PM.   - Continue spironolactone 25 mg daily.  - Continue Coreg 6.25 mg bid. - Continue hydralazine 25 mg tid + Imdur 30 mg daily. - Has not done sleep study yet. Encouraged him to complete  - Labs today - Doing very well EF has recovered with good BP control. Will continue current medicines. If SBP consistently < 110 can cut Entresto in half.   2. Hypertension  - Controlled. - GDMT plan as above - Needs sleep study to r/o OSA, await insurance.   3. DM2 - Much improved control, A1c 14. 4 (10/22) -> 7.2 (4/23). - On Jardiance + Insulin. Awaiting Ozempic - Per PCP.   4. Obesity  - Body mass index is 40.49 kg/m. - Discussed portion control. - Awaiting Ozempic  5 SDOH  - Awaiting Disability    Arvilla Meres, MD  2:21 PM

## 2021-12-14 NOTE — Progress Notes (Signed)
  Echocardiogram 2D Echocardiogram has been performed.  Delcie Roch 12/14/2021, 2:07 PM

## 2021-12-14 NOTE — Addendum Note (Signed)
Encounter addended by: Linda Hedges, RN on: 12/14/2021 2:31 PM  Actions taken: Order list changed, Diagnosis association updated, Charge Capture section accepted, Clinical Note Signed

## 2021-12-16 ENCOUNTER — Other Ambulatory Visit: Payer: Self-pay

## 2021-12-30 ENCOUNTER — Other Ambulatory Visit: Payer: Self-pay

## 2022-01-01 ENCOUNTER — Other Ambulatory Visit (HOSPITAL_COMMUNITY): Payer: Self-pay | Admitting: Internal Medicine

## 2022-01-01 ENCOUNTER — Other Ambulatory Visit: Payer: Self-pay | Admitting: Family Medicine

## 2022-01-01 DIAGNOSIS — I5043 Acute on chronic combined systolic (congestive) and diastolic (congestive) heart failure: Secondary | ICD-10-CM

## 2022-01-01 DIAGNOSIS — E1165 Type 2 diabetes mellitus with hyperglycemia: Secondary | ICD-10-CM

## 2022-01-02 ENCOUNTER — Other Ambulatory Visit: Payer: Self-pay

## 2022-01-02 ENCOUNTER — Other Ambulatory Visit (HOSPITAL_COMMUNITY): Payer: Self-pay | Admitting: Internal Medicine

## 2022-01-02 ENCOUNTER — Other Ambulatory Visit (HOSPITAL_COMMUNITY): Payer: Self-pay

## 2022-01-02 ENCOUNTER — Other Ambulatory Visit: Payer: Self-pay | Admitting: Family Medicine

## 2022-01-02 DIAGNOSIS — E1165 Type 2 diabetes mellitus with hyperglycemia: Secondary | ICD-10-CM

## 2022-01-02 DIAGNOSIS — I5043 Acute on chronic combined systolic (congestive) and diastolic (congestive) heart failure: Secondary | ICD-10-CM

## 2022-01-02 MED ORDER — BASAGLAR KWIKPEN 100 UNIT/ML ~~LOC~~ SOPN
15.0000 [IU] | PEN_INJECTOR | Freq: Every day | SUBCUTANEOUS | 0 refills | Status: DC
  Filled 2022-01-02 – 2022-02-08 (×3): qty 6, 40d supply, fill #0

## 2022-01-03 ENCOUNTER — Other Ambulatory Visit: Payer: Self-pay

## 2022-01-04 ENCOUNTER — Other Ambulatory Visit: Payer: Self-pay

## 2022-01-05 ENCOUNTER — Other Ambulatory Visit: Payer: Self-pay | Admitting: Family Medicine

## 2022-01-05 ENCOUNTER — Other Ambulatory Visit (HOSPITAL_COMMUNITY): Payer: Self-pay | Admitting: Internal Medicine

## 2022-01-05 DIAGNOSIS — I5043 Acute on chronic combined systolic (congestive) and diastolic (congestive) heart failure: Secondary | ICD-10-CM

## 2022-01-06 ENCOUNTER — Other Ambulatory Visit: Payer: Self-pay

## 2022-01-06 ENCOUNTER — Other Ambulatory Visit (HOSPITAL_COMMUNITY): Payer: Self-pay

## 2022-01-06 MED ORDER — ISOSORBIDE MONONITRATE ER 30 MG PO TB24
30.0000 mg | ORAL_TABLET | Freq: Every day | ORAL | 3 refills | Status: DC
  Filled 2022-01-06: qty 30, 30d supply, fill #0
  Filled 2022-02-02: qty 30, 30d supply, fill #1
  Filled 2022-03-07: qty 30, 30d supply, fill #2
  Filled 2022-04-03: qty 30, 30d supply, fill #3

## 2022-01-06 MED ORDER — FUROSEMIDE 40 MG PO TABS
40.0000 mg | ORAL_TABLET | Freq: Every day | ORAL | 3 refills | Status: DC
  Filled 2022-01-06: qty 30, 30d supply, fill #0
  Filled 2022-02-02: qty 30, 30d supply, fill #1
  Filled 2022-03-07: qty 30, 30d supply, fill #2
  Filled 2022-04-03: qty 30, 30d supply, fill #3

## 2022-01-06 MED ORDER — CARVEDILOL 6.25 MG PO TABS
6.2500 mg | ORAL_TABLET | Freq: Two times a day (BID) | ORAL | 3 refills | Status: DC
  Filled 2022-01-06: qty 60, 30d supply, fill #0
  Filled 2022-02-02: qty 60, 30d supply, fill #1
  Filled 2022-03-07: qty 60, 30d supply, fill #2

## 2022-01-06 MED ORDER — GABAPENTIN 300 MG PO CAPS
300.0000 mg | ORAL_CAPSULE | Freq: Three times a day (TID) | ORAL | 0 refills | Status: DC
  Filled 2022-01-06: qty 90, 30d supply, fill #0
  Filled 2022-02-08: qty 90, 30d supply, fill #1
  Filled 2022-04-03: qty 90, 30d supply, fill #2

## 2022-01-06 MED ORDER — SPIRONOLACTONE 25 MG PO TABS
25.0000 mg | ORAL_TABLET | Freq: Every day | ORAL | 3 refills | Status: DC
  Filled 2022-01-06: qty 30, 30d supply, fill #0
  Filled 2022-02-02: qty 30, 30d supply, fill #1
  Filled 2022-03-07: qty 30, 30d supply, fill #2

## 2022-01-06 NOTE — Telephone Encounter (Signed)
Requested Prescriptions  Pending Prescriptions Disp Refills  . gabapentin (NEURONTIN) 300 MG capsule 270 capsule 0    Sig: Take 1 capsule (300 mg total) by mouth 3 (three) times daily.     Neurology: Anticonvulsants - gabapentin Failed - 01/05/2022 10:01 PM      Failed - Cr in normal range and within 360 days    Creatinine, Ser  Date Value Ref Range Status  12/14/2021 1.89 (H) 0.61 - 1.24 mg/dL Final         Passed - Completed PHQ-2 or PHQ-9 in the last 360 days      Passed - Valid encounter within last 12 months    Recent Outpatient Visits          5 months ago Type 2 diabetes mellitus with hyperglycemia, with long-term current use of insulin ([place])   [place], [place], [place]   10 months ago Type 2 diabetes mellitus with hyperglycemia, with long-term current use of insulin [facility])   [place] [facility] Charlott Rakes, MD

## 2022-01-11 ENCOUNTER — Other Ambulatory Visit (HOSPITAL_COMMUNITY): Payer: Self-pay

## 2022-01-11 ENCOUNTER — Other Ambulatory Visit (HOSPITAL_BASED_OUTPATIENT_CLINIC_OR_DEPARTMENT_OTHER): Payer: Self-pay

## 2022-01-16 ENCOUNTER — Other Ambulatory Visit (HOSPITAL_COMMUNITY): Payer: Self-pay

## 2022-01-16 ENCOUNTER — Telehealth (HOSPITAL_COMMUNITY): Payer: Self-pay

## 2022-01-16 NOTE — Telephone Encounter (Signed)
Advanced Heart Failure Patient Advocate Encounter  Review of patient chart for medication assistance.  Entresto assistance through [employer] will expire on 02/12/22. Need to start renewal forms.  Jardiance assistance through [employer] has no current approval on file. Will need to start new application for assistance.  Left message on patient voicemail.  Clista Bernhardt, CPhT Rx Patient Advocate Phone: [phone]

## 2022-01-23 NOTE — Telephone Encounter (Signed)
Advanced Heart Failure Patient Advocate Encounter  Attempt to contact patient about Terry Young and Jardiance assistance. Left voicemail.  Clista Bernhardt, CPhT Rx Patient Advocate Phone: [phone]

## 2022-01-30 NOTE — Telephone Encounter (Signed)
Advanced Heart Failure Patient Advocate Encounter  Third attempt to reach patient about Entresto [employer]) renewal and Jardiance (Merlinda W[employer]el [employer]) assistance due to changes with the HF Fund.  Left voicemail. Assistance will be available in the future as needed.  Clista Bernhardt, CPhT Rx Patient Advocate Phone: [phone]

## 2022-01-30 NOTE — Telephone Encounter (Signed)
Advanced Heart Failure Patient Advocate Encounter  Patient called in about medication assistance. Confirmed email address, sent patient forms to [employer] for Entresto [employer]) renwal, and Jardiance [employer]) application.  Will submit applications once signatures are obtained.  Clista Bernhardt, CPhT Rx Patient Advocate Phone: [phone]

## 2022-02-02 ENCOUNTER — Other Ambulatory Visit (HOSPITAL_COMMUNITY): Payer: Self-pay | Admitting: Internal Medicine

## 2022-02-02 ENCOUNTER — Other Ambulatory Visit (HOSPITAL_COMMUNITY): Payer: Self-pay

## 2022-02-02 DIAGNOSIS — I5043 Acute on chronic combined systolic (congestive) and diastolic (congestive) heart failure: Secondary | ICD-10-CM

## 2022-02-02 MED ORDER — ATORVASTATIN CALCIUM 40 MG PO TABS
40.0000 mg | ORAL_TABLET | Freq: Every day | ORAL | 3 refills | Status: DC
  Filled 2022-02-02: qty 30, 30d supply, fill #0
  Filled 2022-03-07: qty 30, 30d supply, fill #1
  Filled 2022-04-03: qty 30, 30d supply, fill #2
  Filled 2022-05-17: qty 30, 30d supply, fill #3

## 2022-02-06 ENCOUNTER — Other Ambulatory Visit (HOSPITAL_COMMUNITY): Payer: Self-pay

## 2022-02-06 NOTE — Telephone Encounter (Signed)
Advanced Heart Failure Patient Advocate Encounter  [employer] completed by patient, will prepare provider portion and fax applications on 88/32/54.  Clista Bernhardt, CPhT Rx Patient Advocate Phone: [phone]

## 2022-02-07 ENCOUNTER — Telehealth (HOSPITAL_COMMUNITY): Payer: Self-pay

## 2022-02-07 ENCOUNTER — Other Ambulatory Visit (HOSPITAL_COMMUNITY): Payer: Self-pay

## 2022-02-07 MED ORDER — ENTRESTO 97-103 MG PO TABS: 1.0000 | ORAL_TABLET | Freq: Two times a day (BID) | ORAL | 3 refills | Status: DC

## 2022-02-07 NOTE — Telephone Encounter (Signed)
Advanced Heart Failure Patient Advocate Encounter  Application for Entresto faxed to [employer] on 02/07/2022. Application form attached to patient chart.  Stephaine H, CPhT Rx Patient Advocate Phone: [phone] 

## 2022-02-07 NOTE — Telephone Encounter (Signed)
Advanced Heart Failure Patient Advocate Encounter  Application for Jardiance faxed to [facility] on 02/07/2022. Application form attached to patient chart.  Clista Bernhardt, CPhT Rx Patient Advocate Phone: [phone]

## 2022-02-08 ENCOUNTER — Other Ambulatory Visit (HOSPITAL_COMMUNITY): Payer: Self-pay | Admitting: Internal Medicine

## 2022-02-09 ENCOUNTER — Other Ambulatory Visit (HOSPITAL_COMMUNITY): Payer: Self-pay

## 2022-02-09 ENCOUNTER — Other Ambulatory Visit: Payer: Self-pay

## 2022-02-09 MED ORDER — HYDRALAZINE HCL 25 MG PO TABS
25.0000 mg | ORAL_TABLET | Freq: Three times a day (TID) | ORAL | 3 refills | Status: DC
  Filled 2022-02-09: qty 90, 30d supply, fill #0
  Filled 2022-04-03: qty 90, 30d supply, fill #1
  Filled 2022-05-17: qty 90, 30d supply, fill #2
  Filled 2022-07-26: qty 90, 30d supply, fill #3

## 2022-02-09 NOTE — Telephone Encounter (Signed)
Advanced Heart Failure Patient Advocate Encounter  POI will be needed for both Entresto and Jardiance applications. Left voicemail with patient.  If patient returns call please see if samples are needed.  Clista Bernhardt, CPhT Rx Patient Advocate Phone: [phone]

## 2022-02-09 NOTE — Telephone Encounter (Signed)
Advanced Heart Failure Patient Advocate Encounter  Contacted [employer] to verify application received. Representative stated another application had been submitted with alternate MD info. She updated this claim to AHF- [place] and processed application.  [employer] is requesting POI. Pt did not answer, left voicemail.  If patient returns call please see if samples are needed.  Clista Bernhardt, CPhT Rx Patient Advocate Phone: [phone]

## 2022-02-22 NOTE — Telephone Encounter (Signed)
Advanced Heart Failure Patient Advocate Encounter  Attempted to call patient; no answer, no voicemail.

## 2022-02-22 NOTE — Telephone Encounter (Signed)
Advanced Heart Failure Patient Advocate Encounter  Attempted to call patient; no answer, no voicemail. 

## 2022-02-28 NOTE — Telephone Encounter (Signed)
Advanced Heart Failure Patient Advocate Encounter  Patient needs to provide POI for both [employer] and [employer]. Attempted to call patient for update, no answer, left voicemail.

## 2022-02-28 NOTE — Telephone Encounter (Signed)
Advanced Heart Failure Patient Advocate Encounter  Patient needs to provide POI for both [employer] and [employer]. Attempted to call patient for update, no answer, left voicemail. 

## 2022-03-07 ENCOUNTER — Other Ambulatory Visit (HOSPITAL_COMMUNITY): Payer: Self-pay

## 2022-03-16 ENCOUNTER — Telehealth (HOSPITAL_COMMUNITY): Payer: Self-pay | Admitting: Licensed Clinical Social Worker

## 2022-03-16 ENCOUNTER — Ambulatory Visit (HOSPITAL_COMMUNITY)
Admission: RE | Admit: 2022-03-16 | Discharge: 2022-03-16 | Disposition: A | Discharge status: Home / Self Care | Payer: Self-pay | Source: Ambulatory Visit | Attending: Cardiology | Admitting: Cardiology

## 2022-03-16 ENCOUNTER — Other Ambulatory Visit: Payer: Self-pay

## 2022-03-16 ENCOUNTER — Encounter (HOSPITAL_COMMUNITY): Payer: Self-pay

## 2022-03-16 VITALS — BP 168/112 | HR 105 | Wt 292.2 lb

## 2022-03-16 DIAGNOSIS — M79641 Pain in right hand: Secondary | ICD-10-CM | POA: Insufficient documentation

## 2022-03-16 DIAGNOSIS — E669 Obesity, unspecified: Secondary | ICD-10-CM | POA: Insufficient documentation

## 2022-03-16 DIAGNOSIS — M79642 Pain in left hand: Secondary | ICD-10-CM | POA: Insufficient documentation

## 2022-03-16 DIAGNOSIS — E119 Type 2 diabetes mellitus without complications: Secondary | ICD-10-CM | POA: Insufficient documentation

## 2022-03-16 DIAGNOSIS — Z7984 Long term (current) use of oral hypoglycemic drugs: Secondary | ICD-10-CM | POA: Insufficient documentation

## 2022-03-16 DIAGNOSIS — Z6841 Body Mass Index (BMI) 40.0 and over, adult: Secondary | ICD-10-CM | POA: Insufficient documentation

## 2022-03-16 DIAGNOSIS — I5082 Biventricular heart failure: Secondary | ICD-10-CM | POA: Insufficient documentation

## 2022-03-16 DIAGNOSIS — I5043 Acute on chronic combined systolic (congestive) and diastolic (congestive) heart failure: Secondary | ICD-10-CM | POA: Insufficient documentation

## 2022-03-16 DIAGNOSIS — Z794 Long term (current) use of insulin: Secondary | ICD-10-CM | POA: Insufficient documentation

## 2022-03-16 DIAGNOSIS — I429 Cardiomyopathy, unspecified: Secondary | ICD-10-CM | POA: Insufficient documentation

## 2022-03-16 DIAGNOSIS — I110 Hypertensive heart disease with heart failure: Secondary | ICD-10-CM | POA: Insufficient documentation

## 2022-03-16 DIAGNOSIS — I10 Essential (primary) hypertension: Secondary | ICD-10-CM

## 2022-03-16 DIAGNOSIS — Z5941 Food insecurity: Secondary | ICD-10-CM | POA: Insufficient documentation

## 2022-03-16 DIAGNOSIS — Z79899 Other long term (current) drug therapy: Secondary | ICD-10-CM | POA: Insufficient documentation

## 2022-03-16 DIAGNOSIS — Z8249 Family history of ischemic heart disease and other diseases of the circulatory system: Secondary | ICD-10-CM | POA: Insufficient documentation

## 2022-03-16 DIAGNOSIS — Z5986 Financial insecurity: Secondary | ICD-10-CM | POA: Insufficient documentation

## 2022-03-16 LAB — BASIC METABOLIC PANEL
Anion gap: 9 (ref 5–15)
BUN: 28 mg/dL — ABNORMAL HIGH (ref 6–20)
CO2: 24 mmol/L (ref 22–32)
Calcium: 9.3 mg/dL (ref 8.9–10.3)
Chloride: 107 mmol/L (ref 98–111)
Creatinine, Ser: 1.87 mg/dL — ABNORMAL HIGH (ref 0.61–1.24)
GFR, Estimated: 44 mL/min — ABNORMAL LOW (ref >60)
Glucose, Bld: 125 mg/dL — ABNORMAL HIGH (ref 70–99)
Potassium: 5.8 mmol/L — ABNORMAL HIGH (ref 3.5–5.1)
Sodium: 140 mmol/L (ref 135–145)

## 2022-03-16 LAB — URIC ACID: Uric Acid, Serum: 9.5 mg/dL — ABNORMAL HIGH (ref 3.7–8.6)

## 2022-03-16 MED ORDER — CARVEDILOL 12.5 MG PO TABS
12.5000 mg | ORAL_TABLET | Freq: Two times a day (BID) | ORAL | 11 refills | Status: DC
  Filled 2022-03-16 – 2022-03-24 (×3): qty 30, 15d supply, fill #0
  Filled 2022-05-17 (×3): qty 30, 15d supply, fill #1
  Filled 2022-06-19 – 2022-06-20 (×2): qty 30, 15d supply, fill #2
  Filled 2022-07-26: qty 30, 15d supply, fill #3
  Filled 2022-09-15: qty 30, 15d supply, fill #4
  Filled 2022-10-22: qty 30, 15d supply, fill #5
  Filled 2022-11-30: qty 30, 15d supply, fill #6
  Filled 2023-01-07: qty 30, 15d supply, fill #7
  Filled 2023-02-01: qty 30, 15d supply, fill #8
  Filled 2023-03-11: qty 30, 15d supply, fill #9

## 2022-03-16 MED ORDER — EMPAGLIFLOZIN 10 MG PO TABS
10.0000 mg | ORAL_TABLET | Freq: Every day | ORAL | 11 refills | Status: DC
  Filled 2022-03-16 – 2022-03-24 (×3): qty 30, 30d supply, fill #0
  Filled 2022-05-18: qty 30, 30d supply, fill #1
  Filled 2022-06-19: qty 30, 30d supply, fill #2
  Filled 2022-09-15: qty 30, 30d supply, fill #3
  Filled 2022-10-22: qty 30, 30d supply, fill #4
  Filled 2022-11-30: qty 30, 30d supply, fill #5
  Filled 2022-12-31: qty 30, 30d supply, fill #6
  Filled 2023-02-01: qty 30, 30d supply, fill #7
  Filled 2023-03-11: qty 30, 30d supply, fill #8

## 2022-03-16 NOTE — Patient Instructions (Signed)
Jardiance 10 mg daily (1 mo supply samples provided) Labs today - will call you if abnormal Increase Coreg to 12.5 mg twice daily - new Rx sent to local pharmacy Return to see Dr. Gala Romney in 3 - 4 months

## 2022-03-16 NOTE — Progress Notes (Signed)
ADVANCED HF CLINIC CONSULT NOTE  Referring Physician: Tonye Becket, NP Primary Care: Hoy Register, MD HF Cardiologist: Dr. Gala Romney  HPI: Mr Corbo is a 51 y.o. male w/ chronic biventricular systolic heart failure, HTN, poorly controlled DM2, HL and obesity.   Previously followed at [facility]. Diagnosed w/ CHF ~2015 though no records are available during that time. He recalls being diagnosed w/ CHF shortly after a bout w/ a viral illness, which he believes was flu. Echo 2018 w/ EF 40-45%. NST showed no ischemia. CM felt likely NICM from uncontrolled HTN, per notes from [facility]. He has never had a LHC. Uncertain if he has had a cMRI.    Admitted 10/22 to [facility] for a/c CHF and seen by Dr. Sharyn Lull. Echo w/ severe biventricular dysfunction. LVEF down to <20%, RV severely reduced w/ global HK, GIIDD, no LVH, moderately elevated RVSP 52 mmHg w/ mod TR. Only mild MR. He was diuresed w/ IV Lasix and placed on GDMT. On Jardiance, Entresto, Spiro and Coreg. Discharge wt 297 lb.  Hgb A1c 14.    He was seen in the HF [facility] in October 2022. Entresto was increased 97-103 mg bid and lasix was cut back to 40 mg daily. Follow up in [facility] 1/23 and out of all meds x 2 weeks except Entresto. Meds restarted and referred to paramedicine.  He was last seen in 11/2021. EF had improved to 55-60%.   Today he returns for HF follow up.Overall feeling fine. Denies SOB/PND/Orthopnea. Has had some pain in his hands. ? Gout.  Appetite ok. No fever or chills. Weight at home  285-287 pounds. Taking all medications but has been out jardiance for 3 weeks.  Says he will have Medicaid starting tomorrow. Needs transportation assistance. He does not drive. His nephew helps with transportation.      Cardiac Testing   - Care Everywhere ([facility])--> Echo (2018): EF 40-45%  - Echo (01/27/21): EF < 20% Grade II DD RV severely reduced - Echo (12/14/21):  EF 55-60% G1DD  - Care Everywhere ([facility]) Myoview (2018)>> No ischemia      Past Medical  History:  Diagnosis Date   CHF (congestive heart failure) (HCC)    Diabetes mellitus without complication (HCC)    Hypertension    Current Outpatient Medications  Medication Sig Dispense Refill   acetaminophen (TYLENOL) 325 MG tablet Take 650 mg by mouth as needed.     aspirin 81 MG EC tablet Take 1 tablet (81 mg total) by mouth daily. 90 tablet 0   atorvastatin (LIPITOR) 40 MG tablet Take 1 tablet (40 mg total) by mouth daily. 30 tablet 3   carvedilol (COREG) 6.25 MG tablet Take 1 tablet (6.25 mg total) by mouth 2 (two) times daily with a meal. 60 tablet 3   furosemide (LASIX) 40 MG tablet Take 1 tablet (40 mg total) by mouth daily. 30 tablet 3   gabapentin (NEURONTIN) 300 MG capsule Take 1 capsule (300 mg total) by mouth 3 (three) times daily. 270 capsule 0   hydrALAZINE (APRESOLINE) 25 MG tablet Take 1 tablet (25 mg total) by mouth 3 (three) times daily. 90 tablet 3   Insulin Glargine (BASAGLAR KWIKPEN) 100 UNIT/ML Inject 15 Units into the skin daily. 6 mL 0   Insulin Pen Needle 32G X 4 MM MISC Use to inject insulin as directed. 100 each 0   isosorbide mononitrate (IMDUR) 30 MG 24 hr tablet Take 1 tablet (30 mg total) by mouth daily. 30 tablet 3   metFORMIN (  GLUCOPHAGE) 1000 MG tablet Take 1 tablet (1,000 mg total) by mouth 2 (two) times daily with a meal. 180 tablet 0   Multiple Vitamins-Minerals (CENTRUM MEN) TABS Take 1 tablet by mouth daily.     potassium chloride SA (KLOR-CON M) 20 MEQ tablet Take 1 tablet (20 mEq total) by mouth daily. 30 tablet 3   sacubitril-valsartan (ENTRESTO) 97-103 MG Take 1 tablet by mouth 2 (two) times daily. 180 tablet 3   spironolactone (ALDACTONE) 25 MG tablet Take 1 tablet (25 mg total) by mouth daily. 30 tablet 3   No current facility-administered medications for this encounter.   Allergies  Allergen Reactions   Lisinopril Cough   Social History   Socioeconomic History   Marital status: Single    Spouse name: Not on file   Number of children:  Not on file   Years of education: Not on file   Highest education level: Not on file  Occupational History   Not on file  Tobacco Use   Smoking status: Never   Smokeless tobacco: Never  Substance and Sexual Activity   Alcohol use: Never   Drug use: Never   Sexual activity: Not on file  Other Topics Concern   Not on file  Social History Narrative   Not on file   Social Determinants of Health   Financial Resource Strain: High Risk (02/07/2021)   Overall Financial Resource Strain (CARDIA)    Difficulty of Paying Living Expenses: Very hard  Food Insecurity: Food Insecurity Present (06/29/2021)   Hunger Vital Sign    Worried About Running Out of Food in the Last Year: Sometimes true    Ran Out of Food in the Last Year: Never true  Transportation Needs: Unmet Transportation Needs (03/16/2022)   PRAPARE - Administrator, Civil Service (Medical): Yes    Lack of Transportation (Non-Medical): Yes  Physical Activity: Not on file  Stress: Not on file  Social Connections: Not on file  Intimate Partner Violence: Not on file   Family History  Problem Relation Age of Onset   Atrial fibrillation Neg Hx    Family h/o is notable for CHF, CAD and Afib (father).   BP (!) 168/112   Pulse (!) 105   Wt 132.5 kg (292 lb 3.2 oz)   SpO2 99%   BMI 41.93 kg/m   Wt Readings from Last 3 Encounters:  03/16/22 132.5 kg (292 lb 3.2 oz)  12/14/21 128 kg (282 lb 3.2 oz)  12/07/21 124.3 kg (274 lb)   PHYSICAL EXAM: General:  Well appearing. No resp difficulty HEENT: normal Neck: supple. no JVD. Carotids 2+ bilat; no bruits. No lymphadenopathy or thryomegaly appreciated. Cor: PMI nondisplaced. Regular rate & rhythm. No rubs, gallops or murmurs. Lungs: clear Abdomen: soft, nontender, nondistended. No hepatosplenomegaly. No bruits or masses. Good bowel sounds. Extremities: no cyanosis, clubbing, rash, edema Neuro: alert & orientedx3, cranial nerves grossly intact. moves all 4  extremities w/o difficulty. Affect pleasant  ASSESSMENT & PLAN: 1. Chronic Biventricular Systolic Heart Failure  --> EF improved  - presumed NICM, likely hypertensive CM but cannot r/o viral CM  - Echo 2018 ([facility]) EF 40-45%, NST low risk. Has never had LHC nor cMRI  - Echo 10/22 EF <20%, RV severely reduced. - Echo 12/14/21 EF 55-60% G1DD. Repeat Echo next August.  - NYHA II. Volume status stable.  - Out jardiance.  Will need to reorder jardiance 10 mg daily. Given samples today.  - Continue Entresto 97-103 mg bid. -  Continue Lasix 40 mg once daily.  - Continue spironolactone 25 mg daily.  - Increase coreg 12.5 mg twice a day.  - Continue hydralazine 25 mg tid + Imdur 30 mg daily. - Check BMET   2. Hypertension  - Elevated. Increase coreg as above to 12.5 mg twice a day.  -Will need sleep study once set up.    3. DM2 - Much improved control, A1c 14. 4 (10/22) -> 7.2 (4/23). - Restarting Jardiance today.  Per PCP. Starting Ozempic soon.    4. Obesity  - Body mass index is 41.93 kg/m. - Discussed portion control. - Starting Ozempic soon.   5 SDOH  - Disability pending.  -Approved for full Medicaid starting next month.   6. Bilateral Hand Pain ? Gout versus neuropathy.  - Check uric acid.   Discussed medication changes.   Follow up with Dr Gala Romney in 3-4 months.    Tonye Becket, NP  1:56 PM

## 2022-03-16 NOTE — Progress Notes (Signed)
Provided patient with Jardiance samples:  4 bottles with 7 tabs/each bottle; LN [id number]; exp 11/25

## 2022-03-16 NOTE — Telephone Encounter (Signed)
H&V Care Navigation CSW Progress Note  Clinical Social Worker informed that pt does not have transportation to appt today.  CSW able to arrange uber ride through [place] to bring pt to appt.   SDOH Screenings   Food Insecurity: Food Insecurity Present (06/29/2021)  Housing: Low Risk  (02/07/2021)  Transportation Needs: No Transportation Needs (02/07/2021)  Depression (PHQ2-9): Low Risk  (08/02/2021)  Financial Resource Strain: High Risk (02/07/2021)  Tobacco Use: Low Risk  (12/14/2021)    Burna Sis, LCSW Clinical Social Worker [facility] Desk#: [phone] Cell#: [phone]

## 2022-03-17 ENCOUNTER — Telehealth (HOSPITAL_COMMUNITY): Payer: Self-pay | Admitting: Adult Health

## 2022-03-17 ENCOUNTER — Other Ambulatory Visit: Payer: Self-pay

## 2022-03-17 ENCOUNTER — Telehealth (HOSPITAL_COMMUNITY): Payer: Self-pay

## 2022-03-17 ENCOUNTER — Other Ambulatory Visit (HOSPITAL_COMMUNITY): Payer: Self-pay

## 2022-03-17 NOTE — Telephone Encounter (Signed)
Advanced Heart Failure Patient Advocate Encounter  As of 03/17/2022, this patient has active coverage and will no longer need assistance for this medication.  Insurance added to [employer]; Test claim returns $3 copay.

## 2022-03-17 NOTE — Telephone Encounter (Signed)
Patient advised and verbalized understanding. Med list updated to reflect changes, patient states he can't come in until next Friday for blood work. Lokelma will be delivered to patient by Jasmine brown, CMA due to patient having trouble with transportation needs.

## 2022-03-17 NOTE — Telephone Encounter (Signed)
Advanced Heart Failure Patient Advocate Encounter  As of 03/17/2022, this patient has active Medicaid coverage and this will effect the patients needs for medication assistance.  Coverage information has been added to [facility].  Stephaine H, CPhT Rx Patient Advocate Phone: [phone] 

## 2022-03-17 NOTE — Telephone Encounter (Signed)
03/16/22  I called his home phone and mobile and left a message to discuss lab results. K and Creatinine elevated.  Please call. Needs to stop potassium and spironolactone . Needs dose of Lokelma.   Repeat BMET ASAP.  He was called multiple times with no answer and messages left.   I called his Aunt today regarding abnormal lab work. I explained we have tried to call Mr Ravi with his lab results multiple times over the last 24 hours. Explained he has critical labs.    She appreciated the call and will reach out to Mr Michalec and ask him to call the [facility].    Briseyda Fehr NP-C  '10:35 AM

## 2022-03-17 NOTE — Telephone Encounter (Signed)
Advanced Heart Failure Patient Advocate Encounter  As of 03/17/2022, this patient has active coverage and will no longer need assistance for this medication.  Insurance added to WAM; Test claim returns $3 copay. 

## 2022-03-17 NOTE — Telephone Encounter (Signed)
-----   Message from Sherald Hess, NP sent at 03/16/2022  4:56 PM EST ----- I called his home phone and mobile and left a message. Please call. Needs to stop potassium and spironolactone . Needs dose of Lokelma.   Repeat BMET ASAP.

## 2022-03-20 ENCOUNTER — Telehealth (HOSPITAL_COMMUNITY): Payer: Self-pay | Admitting: Licensed Clinical Social Worker

## 2022-03-20 NOTE — Telephone Encounter (Signed)
CSW reviewed pt who has been utilizing HF fund to see if they now had Medicaid following Medicaid expansion taking affect on 12/1.  Confirmed pt now has full Medicaid.    CSW mailed out flyer to pt on how to transition their care now that they have insurance.  Will continue to follow and assist as needed  [name] H. Shandee Jergens, LCSW Clinical Social Worker [facility] Desk#: [phone] Cell#: [phone]  

## 2022-03-24 ENCOUNTER — Ambulatory Visit (HOSPITAL_COMMUNITY)
Admission: RE | Admit: 2022-03-24 | Discharge: 2022-03-24 | Disposition: A | Discharge status: Home / Self Care | Payer: Medicaid Other | Source: Ambulatory Visit | Attending: Cardiology | Admitting: Cardiology

## 2022-03-24 ENCOUNTER — Other Ambulatory Visit: Payer: Self-pay

## 2022-03-24 DIAGNOSIS — I5023 Acute on chronic systolic (congestive) heart failure: Secondary | ICD-10-CM | POA: Insufficient documentation

## 2022-03-30 ENCOUNTER — Other Ambulatory Visit: Payer: Self-pay

## 2022-03-30 ENCOUNTER — Other Ambulatory Visit (HOSPITAL_COMMUNITY): Payer: Self-pay

## 2022-03-30 MED ORDER — ENTRESTO 97-103 MG PO TABS
1.0000 | ORAL_TABLET | Freq: Two times a day (BID) | ORAL | 3 refills | Status: DC
  Filled 2022-03-30: qty 60, 30d supply, fill #0
  Filled 2022-05-18: qty 60, 30d supply, fill #1
  Filled 2022-06-19: qty 60, 30d supply, fill #2
  Filled 2022-07-26: qty 60, 30d supply, fill #3
  Filled 2022-09-15: qty 60, 30d supply, fill #4
  Filled 2022-10-22: qty 60, 30d supply, fill #5
  Filled 2022-11-30: qty 60, 30d supply, fill #6
  Filled 2023-01-07: qty 60, 30d supply, fill #7
  Filled 2023-02-15: qty 60, 30d supply, fill #8
  Filled 2023-03-11: qty 60, 30d supply, fill #9

## 2022-04-03 ENCOUNTER — Other Ambulatory Visit (HOSPITAL_COMMUNITY): Payer: Self-pay

## 2022-04-04 ENCOUNTER — Other Ambulatory Visit: Payer: Self-pay

## 2022-04-05 ENCOUNTER — Other Ambulatory Visit: Payer: Self-pay

## 2022-05-05 ENCOUNTER — Other Ambulatory Visit (HOSPITAL_COMMUNITY): Payer: Self-pay

## 2022-05-17 ENCOUNTER — Other Ambulatory Visit: Payer: Self-pay

## 2022-05-17 ENCOUNTER — Other Ambulatory Visit (HOSPITAL_COMMUNITY): Payer: Self-pay

## 2022-05-17 ENCOUNTER — Other Ambulatory Visit (HOSPITAL_COMMUNITY): Payer: Self-pay | Admitting: Internal Medicine

## 2022-05-17 ENCOUNTER — Other Ambulatory Visit: Payer: Self-pay | Admitting: Family Medicine

## 2022-05-17 DIAGNOSIS — E1165 Type 2 diabetes mellitus with hyperglycemia: Secondary | ICD-10-CM

## 2022-05-17 MED ORDER — ISOSORBIDE MONONITRATE ER 30 MG PO TB24
30.0000 mg | ORAL_TABLET | Freq: Every day | ORAL | 3 refills | Status: DC
  Filled 2022-05-17: qty 30, 30d supply, fill #0
  Filled 2022-06-19: qty 30, 30d supply, fill #1
  Filled 2022-07-26: qty 30, 30d supply, fill #2
  Filled 2022-09-15: qty 30, 30d supply, fill #3

## 2022-05-17 MED ORDER — FUROSEMIDE 40 MG PO TABS
40.0000 mg | ORAL_TABLET | Freq: Every day | ORAL | 3 refills | Status: DC
  Filled 2022-05-17: qty 30, 30d supply, fill #0
  Filled 2022-06-19: qty 30, 30d supply, fill #1
  Filled 2022-07-26: qty 30, 30d supply, fill #2
  Filled 2022-09-15: qty 30, 30d supply, fill #3

## 2022-05-17 NOTE — Telephone Encounter (Signed)
Requested medication (s) are due for refill today:   Yes for both  Requested medication (s) are on the active medication list:   Yes for both  Future visit scheduled:   No   Left a voicemail to call and make an appt.   Last ordered: Metforman 12/09/2021 #180, 0 refills;   Gabapentin 01/06/2022 #270, 0 refills  Returned because all labs and an office visit are due per the protocol   Requested Prescriptions  Pending Prescriptions Disp Refills   metFORMIN (GLUCOPHAGE) 1000 MG tablet 180 tablet 0    Sig: Take 1 tablet (1,000 mg total) by mouth 2 (two) times daily with a meal.     Endocrinology:  Diabetes - Biguanides Failed - 05/17/2022  3:05 AM      Failed - Cr in normal range and within 360 days    Creatinine, Ser  Date Value Ref Range Status  03/16/2022 1.87 (H) 0.61 - 1.24 mg/dL Final         Failed - HBA1C is between 0 and 7.9 and within 180 days    HbA1c, POC (controlled diabetic range)  Date Value Ref Range Status  08/02/2021 7.2 (A) 0.0 - 7.0 % Final         Failed - eGFR in normal range and within 360 days    GFR, Estimated  Date Value Ref Range Status  03/16/2022 44 (L) >60 mL/min Final    Comment:    (NOTE) Calculated using the CKD-EPI Creatinine Equation (2021)    eGFR  Date Value Ref Range Status  02/28/2021 58 (L) >59 mL/min/1.73 Final         Failed - B12 Level in normal range and within 720 days    No results found for: "VITAMINB12"       Failed - Valid encounter within last 6 months    Recent Outpatient Visits           9 months ago Type 2 diabetes mellitus with hyperglycemia, with long-term current use of insulin ([place])   [facility] [place], Charlane Ferretti, MD   1 year ago Type 2 diabetes mellitus with hyperglycemia, with long-term current use of insulin ([place])   [place], Charlane Ferretti, MD              Failed - CBC within normal limits and completed in the last 12 months    WBC   Date Value Ref Range Status  01/27/2021 9.0 4.0 - 10.5 K/uL Final   RBC  Date Value Ref Range Status  01/27/2021 5.53 4.22 - 5.81 MIL/uL Final   Hemoglobin  Date Value Ref Range Status  01/27/2021 16.4 13.0 - 17.0 g/dL Final   HCT  Date Value Ref Range Status  01/27/2021 49.8 39.0 - 52.0 % Final   MCHC  Date Value Ref Range Status  01/27/2021 32.9 30.0 - 36.0 g/dL Final   [facility]  Date Value Ref Range Status  01/27/2021 29.7 26.0 - 34.0 pg Final   MCV  Date Value Ref Range Status  01/27/2021 90.1 80.0 - 100.0 fL Final   No results found for: "PLTCOUNTKUC", "LABPLAT", "POCPLA" RDW  Date Value Ref Range Status  01/27/2021 13.2 11.5 - 15.5 % Final          gabapentin (NEURONTIN) 300 MG capsule 270 capsule 0    Sig: Take 1 capsule (300 mg total) by mouth 3 (three) times daily.     Neurology: Anticonvulsants -  gabapentin Failed - 05/17/2022  3:05 AM      Failed - Cr in normal range and within 360 days    Creatinine, Ser  Date Value Ref Range Status  03/16/2022 1.87 (H) 0.61 - 1.24 mg/dL Final         Passed - Completed PHQ-2 or PHQ-9 in the last 360 days      Passed - Valid encounter within last 12 months    Recent Outpatient Visits           9 months ago Type 2 diabetes mellitus with hyperglycemia, with long-term current use of insulin ([place])   [place], Charlane Ferretti, MD   1 year ago Type 2 diabetes mellitus with hyperglycemia, with long-term current use of insulin [facility])   [place] Charlott Rakes, MD

## 2022-05-18 ENCOUNTER — Telehealth (HOSPITAL_COMMUNITY): Payer: Self-pay

## 2022-05-18 ENCOUNTER — Other Ambulatory Visit: Payer: Self-pay

## 2022-05-18 ENCOUNTER — Other Ambulatory Visit (HOSPITAL_COMMUNITY): Payer: Self-pay

## 2022-05-18 ENCOUNTER — Other Ambulatory Visit: Payer: Self-pay | Admitting: Family Medicine

## 2022-05-18 DIAGNOSIS — E1165 Type 2 diabetes mellitus with hyperglycemia: Secondary | ICD-10-CM

## 2022-05-18 NOTE — Telephone Encounter (Signed)
Advanced Heart Failure Patient Advocate Encounter  Prior authorization is required for Entresto. PA submitted and APPROVED on 05/18/22. Test billing returns $3 copay  Key [id number] Effective: 05/18/22 - 05/19/23  Clista Bernhardt, CPhT Rx Patient Advocate Phone: [phone]

## 2022-05-19 NOTE — Telephone Encounter (Signed)
Called pt - left message to call back for office visit.

## 2022-05-19 NOTE — Telephone Encounter (Signed)
Requested medications are due for refill today.  yes  Requested medications are on the active medications list.  yes  Last refill. 01/09/2022 for metformin and 01/06/2022 for gabapentin  Future visit scheduled.   no  Notes to clinic.  Pt is over du for OV. Called pt and lm to return call for appt.    Requested Prescriptions  Pending Prescriptions Disp Refills   metFORMIN (GLUCOPHAGE) 1000 MG tablet 180 tablet 0    Sig: Take 1 tablet (1,000 mg total) by mouth 2 (two) times daily with a meal.     Endocrinology:  Diabetes - Biguanides Failed - 05/18/2022 11:50 PM      Failed - Cr in normal range and within 360 days    Creatinine, Ser  Date Value Ref Range Status  03/16/2022 1.87 (H) 0.61 - 1.24 mg/dL Final         Failed - HBA1C is between 0 and 7.9 and within 180 days    HbA1c, POC (controlled diabetic range)  Date Value Ref Range Status  08/02/2021 7.2 (A) 0.0 - 7.0 % Final         Failed - eGFR in normal range and within 360 days    GFR, Estimated  Date Value Ref Range Status  03/16/2022 44 (L) >60 mL/min Final    Comment:    (NOTE) Calculated using the CKD-EPI Creatinine Equation (2021)    eGFR  Date Value Ref Range Status  02/28/2021 58 (L) >59 mL/min/1.73 Final         Failed - B12 Level in normal range and within 720 days    No results found for: "VITAMINB12"       Failed - Valid encounter within last 6 months    Recent Outpatient Visits           9 months ago Type 2 diabetes mellitus with hyperglycemia, with long-term current use of insulin ([place])   [place], Charlane Ferretti, MD   1 year ago Type 2 diabetes mellitus with hyperglycemia, with long-term current use of insulin ([place])   [place], Charlane Ferretti, MD              Failed - CBC within normal limits and completed in the last 12 months    WBC  Date Value Ref Range Status  01/27/2021 9.0 4.0 - 10.5 K/uL Final   RBC  Date  Value Ref Range Status  01/27/2021 5.53 4.22 - 5.81 MIL/uL Final   Hemoglobin  Date Value Ref Range Status  01/27/2021 16.4 13.0 - 17.0 g/dL Final   HCT  Date Value Ref Range Status  01/27/2021 49.8 39.0 - 52.0 % Final   MCHC  Date Value Ref Range Status  01/27/2021 32.9 30.0 - 36.0 g/dL Final   [facility]  Date Value Ref Range Status  01/27/2021 29.7 26.0 - 34.0 pg Final   MCV  Date Value Ref Range Status  01/27/2021 90.1 80.0 - 100.0 fL Final   No results found for: "PLTCOUNTKUC", "LABPLAT", "POCPLA" RDW  Date Value Ref Range Status  01/27/2021 13.2 11.5 - 15.5 % Final          gabapentin (NEURONTIN) 300 MG capsule 270 capsule 0    Sig: Take 1 capsule (300 mg total) by mouth 3 (three) times daily.     Neurology: Anticonvulsants - gabapentin Failed - 05/18/2022 11:50 PM      Failed - Cr in normal range and  within 360 days    Creatinine, Ser  Date Value Ref Range Status  03/16/2022 1.87 (H) 0.61 - 1.24 mg/dL Final         Passed - Completed PHQ-2 or PHQ-9 in the last 360 days      Passed - Valid encounter within last 12 months    Recent Outpatient Visits           9 months ago Type 2 diabetes mellitus with hyperglycemia, with long-term current use of insulin ([place])   [place], Charlane Ferretti, MD   1 year ago Type 2 diabetes mellitus with hyperglycemia, with long-term current use of insulin [facility])   [place] Charlott Rakes, MD

## 2022-05-22 ENCOUNTER — Other Ambulatory Visit: Payer: Self-pay

## 2022-06-01 ENCOUNTER — Telehealth: Payer: Self-pay

## 2022-06-01 NOTE — Telephone Encounter (Signed)
Patient attempted to be outreached by Junius Finner, PharmD Candidate on 06/01/2022 to discuss hypertension. Left voicemail for patient to return our call at their convenience at [phone].   [place] [employer]  PharmD Candidate 2024   I attest that I have reviewed the pharmacy student note and verified the documentation and medical decision making.  Maryan Puls, PharmD PGY-1 [facility] Pharmacy Resident

## 2022-06-08 NOTE — Telephone Encounter (Signed)
Patient attempted to be outreached by Junius Finner, PharmD Candidate on 06/08/2022 to discuss hypertension. Left voicemail for patient to return our call at their convenience at [phone].   [place] [employer]  PharmD Candidate 2024   Maryan Puls, PharmD PGY-1 [facility] Pharmacy Resident

## 2022-06-19 ENCOUNTER — Other Ambulatory Visit: Payer: Self-pay | Admitting: Family Medicine

## 2022-06-19 ENCOUNTER — Other Ambulatory Visit (HOSPITAL_COMMUNITY): Payer: Self-pay | Admitting: Internal Medicine

## 2022-06-19 DIAGNOSIS — I5043 Acute on chronic combined systolic (congestive) and diastolic (congestive) heart failure: Secondary | ICD-10-CM

## 2022-06-19 DIAGNOSIS — E1165 Type 2 diabetes mellitus with hyperglycemia: Secondary | ICD-10-CM

## 2022-06-20 ENCOUNTER — Other Ambulatory Visit: Payer: Self-pay

## 2022-06-20 ENCOUNTER — Other Ambulatory Visit (HOSPITAL_COMMUNITY): Payer: Self-pay

## 2022-06-20 MED ORDER — ATORVASTATIN CALCIUM 40 MG PO TABS
40.0000 mg | ORAL_TABLET | Freq: Every day | ORAL | 3 refills | Status: DC
  Filled 2022-06-20: qty 30, 30d supply, fill #0
  Filled 2022-07-26: qty 30, 30d supply, fill #1
  Filled 2022-09-15: qty 30, 30d supply, fill #2
  Filled 2022-10-22: qty 30, 30d supply, fill #3

## 2022-06-21 ENCOUNTER — Other Ambulatory Visit: Payer: Self-pay | Admitting: Family Medicine

## 2022-06-21 ENCOUNTER — Other Ambulatory Visit: Payer: Self-pay

## 2022-06-21 MED ORDER — LANTUS SOLOSTAR 100 UNIT/ML ~~LOC~~ SOPN
15.0000 [IU] | PEN_INJECTOR | Freq: Every day | SUBCUTANEOUS | 0 refills | Status: DC
  Filled 2022-06-21: qty 12, 80d supply, fill #0
  Filled 2022-11-30: qty 3, 20d supply, fill #1

## 2022-06-21 MED ORDER — METFORMIN HCL 1000 MG PO TABS
1000.0000 mg | ORAL_TABLET | Freq: Two times a day (BID) | ORAL | 0 refills | Status: DC
  Filled 2022-06-21: qty 60, 30d supply, fill #0

## 2022-06-21 MED ORDER — BASAGLAR KWIKPEN 100 UNIT/ML ~~LOC~~ SOPN
15.0000 [IU] | PEN_INJECTOR | Freq: Every day | SUBCUTANEOUS | 0 refills | Status: DC
  Filled 2022-06-21: qty 3, 20d supply, fill #0

## 2022-06-22 ENCOUNTER — Encounter (HOSPITAL_COMMUNITY): Payer: Medicaid Other | Admitting: Internal Medicine

## 2022-06-22 ENCOUNTER — Other Ambulatory Visit: Payer: Self-pay

## 2022-06-22 MED ORDER — GABAPENTIN 300 MG PO CAPS
300.0000 mg | ORAL_CAPSULE | Freq: Three times a day (TID) | ORAL | 0 refills | Status: DC
  Filled 2022-06-22: qty 270, 90d supply, fill #0

## 2022-06-26 ENCOUNTER — Other Ambulatory Visit: Payer: Self-pay

## 2022-07-04 ENCOUNTER — Other Ambulatory Visit: Payer: Self-pay

## 2022-07-10 ENCOUNTER — Other Ambulatory Visit: Payer: Self-pay

## 2022-07-17 ENCOUNTER — Telehealth: Payer: Self-pay | Admitting: Pharmacist

## 2022-07-17 NOTE — Progress Notes (Signed)
Patient attempted to be outreached by Darrall Dears, PharmD Candidate on 07/17/2022 to discuss hypertension. Left voicemail for patient to return our call at their convenience at [phone].   Darrall Dears  PharmD Candidate   Catie Hedwig Morton, PharmD, [place], [facility] [phone]

## 2022-07-24 ENCOUNTER — Other Ambulatory Visit: Payer: Self-pay

## 2022-07-26 ENCOUNTER — Other Ambulatory Visit: Payer: Self-pay | Admitting: Family Medicine

## 2022-07-26 DIAGNOSIS — Z794 Long term (current) use of insulin: Secondary | ICD-10-CM

## 2022-07-28 ENCOUNTER — Other Ambulatory Visit (HOSPITAL_COMMUNITY): Payer: Self-pay

## 2022-07-28 ENCOUNTER — Other Ambulatory Visit: Payer: Self-pay

## 2022-07-31 ENCOUNTER — Other Ambulatory Visit: Payer: Self-pay

## 2022-08-03 ENCOUNTER — Telehealth (HOSPITAL_COMMUNITY): Payer: Self-pay

## 2022-08-03 ENCOUNTER — Ambulatory Visit (HOSPITAL_COMMUNITY)
Admission: RE | Admit: 2022-08-03 | Discharge: 2022-08-03 | Disposition: A | Discharge status: Home / Self Care | Payer: Medicaid Other | Source: Ambulatory Visit | Attending: Internal Medicine | Admitting: Internal Medicine

## 2022-08-03 ENCOUNTER — Other Ambulatory Visit: Payer: Self-pay

## 2022-08-03 ENCOUNTER — Encounter (HOSPITAL_COMMUNITY): Payer: Self-pay | Admitting: Internal Medicine

## 2022-08-03 ENCOUNTER — Other Ambulatory Visit (HOSPITAL_COMMUNITY): Payer: Self-pay

## 2022-08-03 VITALS — BP 124/80 | HR 86 | Wt 302.6 lb

## 2022-08-03 DIAGNOSIS — Z6841 Body Mass Index (BMI) 40.0 and over, adult: Secondary | ICD-10-CM | POA: Diagnosis not present

## 2022-08-03 DIAGNOSIS — N1831 Chronic kidney disease, stage 3a: Secondary | ICD-10-CM | POA: Insufficient documentation

## 2022-08-03 DIAGNOSIS — I5023 Acute on chronic systolic (congestive) heart failure: Secondary | ICD-10-CM | POA: Diagnosis not present

## 2022-08-03 DIAGNOSIS — E1165 Type 2 diabetes mellitus with hyperglycemia: Secondary | ICD-10-CM | POA: Diagnosis not present

## 2022-08-03 DIAGNOSIS — E1122 Type 2 diabetes mellitus with diabetic chronic kidney disease: Secondary | ICD-10-CM | POA: Insufficient documentation

## 2022-08-03 DIAGNOSIS — I5082 Biventricular heart failure: Secondary | ICD-10-CM | POA: Diagnosis not present

## 2022-08-03 DIAGNOSIS — Z79899 Other long term (current) drug therapy: Secondary | ICD-10-CM | POA: Insufficient documentation

## 2022-08-03 DIAGNOSIS — Z794 Long term (current) use of insulin: Secondary | ICD-10-CM

## 2022-08-03 DIAGNOSIS — I130 Hypertensive heart and chronic kidney disease with heart failure and stage 1 through stage 4 chronic kidney disease, or unspecified chronic kidney disease: Secondary | ICD-10-CM | POA: Insufficient documentation

## 2022-08-03 DIAGNOSIS — E669 Obesity, unspecified: Secondary | ICD-10-CM | POA: Diagnosis not present

## 2022-08-03 DIAGNOSIS — I10 Essential (primary) hypertension: Secondary | ICD-10-CM | POA: Diagnosis not present

## 2022-08-03 LAB — BASIC METABOLIC PANEL
Anion gap: 10 (ref 5–15)
BUN: 19 mg/dL (ref 6–20)
CO2: 25 mmol/L (ref 22–32)
Calcium: 8.6 mg/dL — ABNORMAL LOW (ref 8.9–10.3)
Chloride: 103 mmol/L (ref 98–111)
Creatinine, Ser: 1.45 mg/dL — ABNORMAL HIGH (ref 0.61–1.24)
GFR, Estimated: 59 mL/min — ABNORMAL LOW (ref >60)
Glucose, Bld: 183 mg/dL — ABNORMAL HIGH (ref 70–99)
Potassium: 3.8 mmol/L (ref 3.5–5.1)
Sodium: 138 mmol/L (ref 135–145)

## 2022-08-03 LAB — BRAIN NATRIURETIC PEPTIDE: B Natriuretic Peptide: 40.9 pg/mL (ref 0.0–100.0)

## 2022-08-03 LAB — HEMOGLOBIN A1C
Hgb A1c MFr Bld: 7.6 % — ABNORMAL HIGH (ref 4.8–5.6)
Mean Plasma Glucose: 171.42 mg/dL

## 2022-08-03 NOTE — Progress Notes (Signed)
Medication Samples have been provided to the patient.  Drug name: Jardiance       Strength: 10 mg         Qty: 1 bottle  LOT: [id number]  Exp.Date: 01/26  Dosing instructions: take 1 tablet daily  The patient has been instructed regarding the correct time, dose, and frequency of taking this medication, including desired effects and most common side effects.   Rishan Oyama M Burgess Sheriff 2:22 PM 08/03/2022

## 2022-08-03 NOTE — Patient Instructions (Signed)
Good to see you today!  No medication changes were made   Labs done today, your results will be available in MyChart, we will contact you for abnormal readings.  Your physician has requested that you have an echocardiogram. Echocardiography is a painless test that uses sound waves to create images of your heart. It provides your doctor with information about the size and shape of your heart and how well your heart's chambers and valves are working. This procedure takes approximately one hour. There are no restrictions for this procedure. Please do NOT wear cologne, perfume, aftershave, or lotions (deodorant is allowed). Please arrive 15 minutes prior to your appointment time.  Your physician recommends that you schedule a follow-up appointment in: 6 months(October)With echocardiogram.Call office in September to schedule an appointment  If you have any questions or concerns before your next appointment please send [place] a message through [place] or call our office at [phone].    TO LEAVE A MESSAGE FOR THE NURSE SELECT OPTION 2, PLEASE LEAVE A MESSAGE INCLUDING: YOUR NAME DATE OF BIRTH CALL BACK NUMBER REASON FOR CALL**this is important as we prioritize the call backs  YOU WILL RECEIVE A CALL BACK THE SAME DAY AS LONG AS YOU CALL BEFORE 4:00 PM  At the [facility], you and your health needs are our priority. As part of our continuing mission to provide you with exceptional heart care, we have created designated Provider Care Teams. These Care Teams include your primary Cardiologist (physician) and Advanced Practice Providers (APPs- Physician Assistants and Nurse Practitioners) who all work together to provide you with the care you need, when you need it.   You may see any of the following providers on your designated Care Team at your next follow up: Dr Arvilla Meres Dr Marca Ancona Dr. Marcos Eke, NP Robbie Lis, [place] [facility] [place], [place] Brynda Peon, NP Karle Plumber, PharmD   Please be sure to bring in all your medications bottles to every appointment.    Thank you for choosing [facility]

## 2022-08-03 NOTE — Progress Notes (Signed)
ADVANCED HF CLINIC CONSULT NOTE  Referring Physician: Tonye Becket, NP Primary Care: Hoy Register, MD HF Cardiologist: Dr. Gala Romney  HPI:  Terry Young is a 52 y.o. male w/ chronic biventricular systolic heart failure, HTN, poorly controlled DM2, HL and obesity.   Previously followed at [facility]. Diagnosed w/ CHF ~2015 though no records are available during that time. He recalls being diagnosed w/ CHF shortly after a bout w/ a viral illness, which he believes was flu. Echo 2018 w/ EF 40-45%. NST showed no ischemia. CM felt likely NICM from uncontrolled HTN, per notes from [facility]. He has never had a LHC. Uncertain if he has had a cMRI.    Admitted 10/22 a/c CHF. Echo w/ severe biventricular dysfunction. LVEF down to <20%, RV severely reduced w/ global HK, GIIDD. He was diuresed w/ IV Lasix and placed on GDMT. On Jardiance, Entresto, Spiro and Coreg. Discharge wt 297 lb.  Hgb A1c 14.    He was seen in the HF [facility] in October 2022. Entresto was increased 97-103 mg bid and lasix was cut back to 40 mg daily. Follow up in [facility] 1/23 and out of all meds x 2 weeks except Entresto. Meds restarted and referred to paramedicine.  Echo (12/14/21):  EF 55-60% G1DD   Today he returns for HF follow up. Says he feels good. Doing some walking. No significant SOB. Mild edema. Takes lasix  daily. Out of Jardiance and metformin.      Cardiac Testing   - Care Everywhere ([facility])--> Echo (2018): EF 40-45% - Echo (01/27/21): EF < 20% Grade II DD RV severely reduced - Echo (12/14/21):  EF 55-60% G1DD  - Care Everywhere ([facility]) Myoview (2018)>> No ischemia      Past Medical History:  Diagnosis Date   CHF (congestive heart failure)    Diabetes mellitus without complication    Hypertension    Current Outpatient Medications  Medication Sig Dispense Refill   acetaminophen (TYLENOL) 325 MG tablet Take 650 mg by mouth as needed.     aspirin 81 MG EC tablet Take 1 tablet (81 mg total) by mouth daily. 90 tablet 0    atorvastatin (LIPITOR) 40 MG tablet Take 1 tablet (40 mg total) by mouth daily. 30 tablet 3   carvedilol (COREG) 12.5 MG tablet Take 1 tablet (12.5 mg total) by mouth 2 (two) times daily with a meal. 30 tablet 11   furosemide (LASIX) 40 MG tablet Take 1 tablet (40 mg total) by mouth daily. 30 tablet 3   gabapentin (NEURONTIN) 300 MG capsule Take 1 capsule (300 mg total) by mouth 3 (three) times daily. 270 capsule 0   hydrALAZINE (APRESOLINE) 25 MG tablet Take 1 tablet (25 mg total) by mouth 3 (three) times daily. 90 tablet 3   insulin glargine (LANTUS SOLOSTAR) 100 UNIT/ML Solostar Pen Inject 15 Units into the skin at bedtime. Needs office visit 15 mL 0   Insulin Pen Needle 32G X 4 MM MISC Use to inject insulin as directed. 100 each 0   isosorbide mononitrate (IMDUR) 30 MG 24 hr tablet Take 1 tablet (30 mg total) by mouth daily. 30 tablet 3   Multiple Vitamins-Minerals (CENTRUM MEN) TABS Take 1 tablet by mouth daily.     sacubitril-valsartan (ENTRESTO) 97-103 MG Take 1 tablet by mouth 2 (two) times daily. 180 tablet 3   empagliflozin (JARDIANCE) 10 MG TABS tablet Take 1 tablet (10 mg total) by mouth daily before breakfast. (Patient not taking: Reported on 08/03/2022) 30 tablet 11  metFORMIN (GLUCOPHAGE) 1000 MG tablet Take 1 tablet (1,000 mg total) by mouth 2 (two) times daily with a meal. Must have office visit for refills (Patient not taking: Reported on 08/03/2022) 60 tablet 0   No current facility-administered medications for this encounter.   Allergies  Allergen Reactions   Lisinopril Cough   Social History   Socioeconomic History   Marital status: Single    Spouse name: Not on file   Number of children: Not on file   Years of education: Not on file   Highest education level: Not on file  Occupational History   Not on file  Tobacco Use   Smoking status: Never   Smokeless tobacco: Never  Substance and Sexual Activity   Alcohol use: Never   Drug use: Never   Sexual activity: Not  on file  Other Topics Concern   Not on file  Social History Narrative   Not on file   Social Determinants of Health   Financial Resource Strain: High Risk (02/07/2021)   Overall Financial Resource Strain (CARDIA)    Difficulty of Paying Living Expenses: Very hard  Food Insecurity: Food Insecurity Present (06/29/2021)   Hunger Vital Sign    Worried About Running Out of Food in the Last Year: Sometimes true    Ran Out of Food in the Last Year: Never true  Transportation Needs: Unmet Transportation Needs (03/16/2022)   PRAPARE - Administrator, Civil Service (Medical): Yes    Lack of Transportation (Non-Medical): Yes  Physical Activity: Not on file  Stress: Not on file  Social Connections: Not on file  Intimate Partner Violence: Not on file   Family History  Problem Relation Age of Onset   Atrial fibrillation Neg Hx    Family h/o is notable for CHF, CAD and Afib (father).   BP 124/80   Pulse 86   Wt (!) 137.3 kg (302 lb 9.6 oz)   SpO2 99%   BMI 43.42 kg/m   Wt Readings from Last 3 Encounters:  08/03/22 (!) 137.3 kg (302 lb 9.6 oz)  03/16/22 132.5 kg (292 lb 3.2 oz)  12/14/21 128 kg (282 lb 3.2 oz)   PHYSICAL EXAM: General:  Well appearing. No resp difficulty HEENT: normal Neck: supple. no JVD. Carotids 2+ bilat; no bruits. No lymphadenopathy or thryomegaly appreciated. Cor: PMI nondisplaced. Regular rate & rhythm. No rubs, gallops or murmurs. Lungs: clear Abdomen: obese  soft, nontender, nondistended. No hepatosplenomegaly. No bruits or masses. Good bowel sounds. Extremities: no cyanosis, clubbing, rash, edema Neuro: alert & orientedx3, cranial nerves grossly intact. moves all 4 extremities w/o difficulty. Affect pleasant  ECG: NSR 86 IVCD Personally reviewed  ASSESSMENT & PLAN:  1. Chronic Biventricular Systolic Heart Failure  --> EF improved  - presumed NICM, likely hypertensive CM but cannot r/o viral CM  - Echo 2018 ([facility]) EF 40-45%, NST low risk.  Has never had LHC nor cMRI  - Echo 10/22 EF <20%, RV severely reduced. - Echo 12/14/21 EF 55-60% G1DD. - Doing well NYHA I-II. Volume status stable.  - Resume Jardiance 10 daily - Continue Entresto 97-103 mg bid. - Continue Lasix 40 mg once daily.  - Continue spironolactone 25 mg daily.  - Continue coreg 12.5 mg twice a day.  - Continue hydralazine 25 mg tid + Imdur 30 mg daily. - Repeat echo in 6 months to ensure stability  - labs today  2. Hypertension  - Blood pressure well controlled. Continue current regimen.   3.  DM2 - Much improved control, A1c 14. 4 (10/22) -> 7.2 (4/23). - Restart Jardiance - check HGBa1c  4. Obesity  - Body mass index is 43.42 kg/m. - Says he was prescribed Ozempic prior to getting Medicaid and never received the medicine. Would consider starting now (follows with [facility])  5. CKD 3a - continue Jardiance   Arvilla Meres, MD  2:02 PM

## 2022-08-03 NOTE — Addendum Note (Signed)
Encounter addended by: Suezanne Cheshire, RN on: 08/03/2022 2:23 PM  Actions taken: Order list changed, Diagnosis association updated, Clinical Note Signed

## 2022-08-03 NOTE — Telephone Encounter (Signed)
Advanced Heart Failure Patient Advocate Encounter  Prior authorization for Jardiance submitted and APPROVED. Test billing returns $4 copay for 90 day supply.  Key [id number] Effective: 08/03/2022 - 08/03/2023  Burnell Blanks, CPhT Rx Patient Advocate Phone: [phone]

## 2022-08-03 NOTE — Addendum Note (Signed)
Encounter addended by: Linda Hedges, RN on: 08/03/2022 2:23 PM  Actions taken: Clinical Note Signed

## 2022-08-07 ENCOUNTER — Other Ambulatory Visit: Payer: Self-pay

## 2022-09-15 ENCOUNTER — Other Ambulatory Visit: Payer: Self-pay | Admitting: Family Medicine

## 2022-09-15 ENCOUNTER — Other Ambulatory Visit (HOSPITAL_COMMUNITY): Payer: Self-pay

## 2022-09-15 ENCOUNTER — Other Ambulatory Visit: Payer: Self-pay

## 2022-09-15 ENCOUNTER — Other Ambulatory Visit (HOSPITAL_COMMUNITY): Payer: Self-pay | Admitting: Internal Medicine

## 2022-09-15 DIAGNOSIS — E1165 Type 2 diabetes mellitus with hyperglycemia: Secondary | ICD-10-CM

## 2022-09-15 MED ORDER — HYDRALAZINE HCL 25 MG PO TABS
25.0000 mg | ORAL_TABLET | Freq: Three times a day (TID) | ORAL | 3 refills | Status: DC
  Filled 2022-09-15: qty 270, 90d supply, fill #0
  Filled 2023-03-29: qty 270, 90d supply, fill #1
  Filled 2023-06-12: qty 270, 90d supply, fill #2
  Filled 2023-09-13: qty 270, 90d supply, fill #0

## 2022-10-13 ENCOUNTER — Other Ambulatory Visit: Payer: Self-pay

## 2022-10-22 ENCOUNTER — Other Ambulatory Visit (HOSPITAL_COMMUNITY): Payer: Self-pay | Admitting: Internal Medicine

## 2022-10-23 ENCOUNTER — Other Ambulatory Visit (HOSPITAL_COMMUNITY): Payer: Self-pay

## 2022-10-23 MED ORDER — ISOSORBIDE MONONITRATE ER 30 MG PO TB24
30.0000 mg | ORAL_TABLET | Freq: Every day | ORAL | 3 refills | Status: DC
  Filled 2022-10-23: qty 30, 30d supply, fill #0
  Filled 2022-11-30: qty 30, 30d supply, fill #1
  Filled 2023-01-07: qty 30, 30d supply, fill #2
  Filled 2023-02-15: qty 30, 30d supply, fill #3

## 2022-10-23 MED ORDER — FUROSEMIDE 40 MG PO TABS
40.0000 mg | ORAL_TABLET | Freq: Every day | ORAL | 3 refills | Status: DC
  Filled 2022-10-23: qty 30, 30d supply, fill #0
  Filled 2022-11-30: qty 30, 30d supply, fill #1
  Filled 2022-12-31: qty 30, 30d supply, fill #2
  Filled 2023-02-01: qty 30, 30d supply, fill #3

## 2022-10-25 ENCOUNTER — Other Ambulatory Visit: Payer: Self-pay

## 2022-11-30 ENCOUNTER — Other Ambulatory Visit (HOSPITAL_COMMUNITY): Payer: Self-pay | Admitting: Internal Medicine

## 2022-11-30 ENCOUNTER — Other Ambulatory Visit: Payer: Self-pay | Admitting: Family Medicine

## 2022-11-30 DIAGNOSIS — I5043 Acute on chronic combined systolic (congestive) and diastolic (congestive) heart failure: Secondary | ICD-10-CM

## 2022-12-01 ENCOUNTER — Other Ambulatory Visit: Payer: Self-pay

## 2022-12-01 ENCOUNTER — Other Ambulatory Visit (HOSPITAL_COMMUNITY): Payer: Self-pay

## 2022-12-01 MED ORDER — ATORVASTATIN CALCIUM 40 MG PO TABS
40.0000 mg | ORAL_TABLET | Freq: Every day | ORAL | 3 refills | Status: DC
  Filled 2022-12-01: qty 90, 90d supply, fill #0
  Filled 2023-03-11: qty 90, 90d supply, fill #1
  Filled 2023-06-12: qty 90, 90d supply, fill #2
  Filled 2023-09-12: qty 90, 90d supply, fill #3
  Filled 2023-09-13: qty 90, 90d supply, fill #0

## 2022-12-25 ENCOUNTER — Other Ambulatory Visit: Payer: Self-pay | Admitting: Family Medicine

## 2022-12-26 ENCOUNTER — Other Ambulatory Visit: Payer: Self-pay

## 2022-12-27 ENCOUNTER — Other Ambulatory Visit: Payer: Self-pay | Admitting: Family Medicine

## 2022-12-28 ENCOUNTER — Other Ambulatory Visit: Payer: Self-pay

## 2022-12-28 MED ORDER — GABAPENTIN 300 MG PO CAPS
300.0000 mg | ORAL_CAPSULE | Freq: Three times a day (TID) | ORAL | 0 refills | Status: DC
  Filled 2022-12-28: qty 270, 90d supply, fill #0

## 2023-01-01 ENCOUNTER — Other Ambulatory Visit: Payer: Self-pay

## 2023-01-07 ENCOUNTER — Other Ambulatory Visit: Payer: Self-pay | Admitting: Family Medicine

## 2023-01-07 DIAGNOSIS — E1165 Type 2 diabetes mellitus with hyperglycemia: Secondary | ICD-10-CM

## 2023-01-10 ENCOUNTER — Other Ambulatory Visit: Payer: Self-pay

## 2023-01-12 ENCOUNTER — Other Ambulatory Visit: Payer: Self-pay

## 2023-02-01 ENCOUNTER — Other Ambulatory Visit: Payer: Self-pay | Admitting: Family Medicine

## 2023-02-02 NOTE — Telephone Encounter (Signed)
Requested medication (s) are due for refill today: yes  Requested medication (s) are on the active medication list: yes  Last refill:  06/21/22  Future visit scheduled: no  Notes to clinic:  Unable to refill per protocol due to failed labs, no updated A1c results.      Requested Prescriptions  Pending Prescriptions Disp Refills   insulin glargine (LANTUS SOLOSTAR) 100 UNIT/ML Solostar Pen 15 mL 0    Sig: Inject 15 Units into the skin at bedtime. Needs office visit     Endocrinology:  Diabetes - Insulins Failed - 02/01/2023  9:48 PM      Failed - HBA1C is between 0 and 7.9 and within 180 days    HbA1c, POC (controlled diabetic range)  Date Value Ref Range Status  08/02/2021 7.2 (A) 0.0 - 7.0 % Final   Hgb A1c MFr Bld  Date Value Ref Range Status  08/03/2022 7.6 (H) 4.8 - 5.6 % Final    Comment:    (NOTE) Pre diabetes:          5.7%-6.4%  Diabetes:              >6.4%  Glycemic control for   <7.0% adults with diabetes          Failed - Valid encounter within last 6 months    Recent Outpatient Visits           1 year ago Type 2 diabetes mellitus with hyperglycemia, with long-term current use of insulin ([facility])   [place] [facility], [place], MD   1 year ago Type 2 diabetes mellitus with hyperglycemia, with long-term current use of insulin [facility])   [facility] Hoy Register, MD

## 2023-02-05 ENCOUNTER — Other Ambulatory Visit: Payer: Self-pay

## 2023-02-07 ENCOUNTER — Other Ambulatory Visit: Payer: Self-pay

## 2023-02-07 ENCOUNTER — Other Ambulatory Visit (HOSPITAL_COMMUNITY): Payer: Self-pay

## 2023-02-16 ENCOUNTER — Other Ambulatory Visit: Payer: Self-pay

## 2023-03-11 ENCOUNTER — Other Ambulatory Visit (HOSPITAL_COMMUNITY): Payer: Self-pay | Admitting: Internal Medicine

## 2023-03-13 ENCOUNTER — Other Ambulatory Visit (HOSPITAL_COMMUNITY): Payer: Self-pay

## 2023-03-13 MED ORDER — FUROSEMIDE 40 MG PO TABS
40.0000 mg | ORAL_TABLET | Freq: Every day | ORAL | 3 refills | Status: DC
  Filled 2023-03-13: qty 30, 30d supply, fill #0
  Filled 2023-04-13: qty 30, 30d supply, fill #1
  Filled 2023-05-16: qty 30, 30d supply, fill #2
  Filled 2023-06-04 – 2023-06-12 (×2): qty 30, 30d supply, fill #3

## 2023-03-14 ENCOUNTER — Other Ambulatory Visit: Payer: Self-pay

## 2023-03-29 ENCOUNTER — Other Ambulatory Visit (HOSPITAL_COMMUNITY): Payer: Self-pay | Admitting: Internal Medicine

## 2023-03-30 ENCOUNTER — Other Ambulatory Visit (HOSPITAL_COMMUNITY): Payer: Self-pay

## 2023-03-30 MED ORDER — ISOSORBIDE MONONITRATE ER 30 MG PO TB24
30.0000 mg | ORAL_TABLET | Freq: Every day | ORAL | 3 refills | Status: DC
  Filled 2023-03-30: qty 30, 30d supply, fill #0
  Filled 2023-05-06: qty 30, 30d supply, fill #1
  Filled 2023-06-03: qty 30, 30d supply, fill #2
  Filled 2023-07-10: qty 30, 30d supply, fill #3
  Filled 2023-07-11: qty 30, 30d supply, fill #0

## 2023-04-03 ENCOUNTER — Other Ambulatory Visit: Payer: Self-pay

## 2023-04-13 ENCOUNTER — Other Ambulatory Visit: Payer: Self-pay

## 2023-04-13 ENCOUNTER — Other Ambulatory Visit (HOSPITAL_COMMUNITY): Payer: Self-pay | Admitting: Adult Health

## 2023-04-13 DIAGNOSIS — I5043 Acute on chronic combined systolic (congestive) and diastolic (congestive) heart failure: Secondary | ICD-10-CM

## 2023-04-13 MED ORDER — EMPAGLIFLOZIN 10 MG PO TABS
10.0000 mg | ORAL_TABLET | Freq: Every day | ORAL | 1 refills | Status: DC
  Filled 2023-04-13: qty 30, 30d supply, fill #0
  Filled 2023-05-16: qty 30, 30d supply, fill #1

## 2023-04-13 MED ORDER — CARVEDILOL 12.5 MG PO TABS
12.5000 mg | ORAL_TABLET | Freq: Two times a day (BID) | ORAL | 1 refills | Status: DC
  Filled 2023-04-13: qty 30, 15d supply, fill #0
  Filled 2023-05-06: qty 30, 15d supply, fill #1

## 2023-04-16 ENCOUNTER — Other Ambulatory Visit: Payer: Self-pay

## 2023-05-06 ENCOUNTER — Other Ambulatory Visit (HOSPITAL_COMMUNITY): Payer: Self-pay | Admitting: Adult Health

## 2023-05-07 ENCOUNTER — Other Ambulatory Visit: Payer: Self-pay

## 2023-05-07 MED ORDER — ENTRESTO 97-103 MG PO TABS
1.0000 | ORAL_TABLET | Freq: Two times a day (BID) | ORAL | 0 refills | Status: DC
  Filled 2023-05-07: qty 60, 30d supply, fill #0

## 2023-05-08 ENCOUNTER — Other Ambulatory Visit: Payer: Self-pay

## 2023-05-17 ENCOUNTER — Other Ambulatory Visit: Payer: Self-pay

## 2023-06-03 ENCOUNTER — Other Ambulatory Visit (HOSPITAL_COMMUNITY): Payer: Self-pay | Admitting: Adult Health

## 2023-06-03 ENCOUNTER — Other Ambulatory Visit: Payer: Self-pay | Admitting: Family Medicine

## 2023-06-03 ENCOUNTER — Other Ambulatory Visit (HOSPITAL_COMMUNITY): Payer: Self-pay | Admitting: Internal Medicine

## 2023-06-03 DIAGNOSIS — I5043 Acute on chronic combined systolic (congestive) and diastolic (congestive) heart failure: Secondary | ICD-10-CM

## 2023-06-04 ENCOUNTER — Other Ambulatory Visit: Payer: Self-pay

## 2023-06-04 MED ORDER — SACUBITRIL-VALSARTAN 97-103 MG PO TABS
1.0000 | ORAL_TABLET | Freq: Two times a day (BID) | ORAL | 0 refills | Status: DC
  Filled 2023-06-04: qty 60, 30d supply, fill #0

## 2023-06-04 MED ORDER — CARVEDILOL 12.5 MG PO TABS
12.5000 mg | ORAL_TABLET | Freq: Two times a day (BID) | ORAL | 0 refills | Status: DC
  Filled 2023-06-04: qty 180, 90d supply, fill #0

## 2023-06-05 ENCOUNTER — Other Ambulatory Visit: Payer: Self-pay

## 2023-06-05 ENCOUNTER — Other Ambulatory Visit (HOSPITAL_COMMUNITY): Payer: Self-pay

## 2023-06-12 ENCOUNTER — Other Ambulatory Visit: Payer: Self-pay | Admitting: Family Medicine

## 2023-06-12 ENCOUNTER — Other Ambulatory Visit (HOSPITAL_COMMUNITY): Payer: Self-pay | Admitting: Adult Health

## 2023-06-12 ENCOUNTER — Other Ambulatory Visit: Payer: Self-pay

## 2023-06-12 DIAGNOSIS — I5043 Acute on chronic combined systolic (congestive) and diastolic (congestive) heart failure: Secondary | ICD-10-CM

## 2023-06-12 MED ORDER — EMPAGLIFLOZIN 10 MG PO TABS
10.0000 mg | ORAL_TABLET | Freq: Every day | ORAL | 0 refills | Status: DC
  Filled 2023-06-12: qty 30, 30d supply, fill #0

## 2023-06-12 MED ORDER — GABAPENTIN 300 MG PO CAPS
300.0000 mg | ORAL_CAPSULE | Freq: Three times a day (TID) | ORAL | 0 refills | Status: DC
  Filled 2023-06-12: qty 270, 90d supply, fill #0

## 2023-06-13 ENCOUNTER — Other Ambulatory Visit: Payer: Self-pay

## 2023-07-10 ENCOUNTER — Other Ambulatory Visit (HOSPITAL_COMMUNITY): Payer: Self-pay | Admitting: Internal Medicine

## 2023-07-10 ENCOUNTER — Other Ambulatory Visit (HOSPITAL_COMMUNITY): Payer: Self-pay | Admitting: Adult Health

## 2023-07-10 DIAGNOSIS — I5043 Acute on chronic combined systolic (congestive) and diastolic (congestive) heart failure: Secondary | ICD-10-CM

## 2023-07-11 ENCOUNTER — Other Ambulatory Visit: Payer: Self-pay

## 2023-07-11 ENCOUNTER — Other Ambulatory Visit (HOSPITAL_COMMUNITY): Payer: Self-pay

## 2023-07-11 MED ORDER — ENTRESTO 97-103 MG PO TABS
1.0000 | ORAL_TABLET | Freq: Two times a day (BID) | ORAL | 0 refills | Status: DC
  Filled 2023-07-11: qty 60, 30d supply, fill #0

## 2023-07-11 MED ORDER — EMPAGLIFLOZIN 10 MG PO TABS
10.0000 mg | ORAL_TABLET | Freq: Every day | ORAL | 0 refills | Status: DC
  Filled 2023-07-11: qty 15, 15d supply, fill #0

## 2023-07-11 MED ORDER — FUROSEMIDE 40 MG PO TABS
40.0000 mg | ORAL_TABLET | Freq: Every day | ORAL | 1 refills | Status: DC
  Filled 2023-07-11 (×2): qty 30, 30d supply, fill #0
  Filled 2023-08-03 – 2023-08-15 (×2): qty 30, 30d supply, fill #1

## 2023-07-16 ENCOUNTER — Other Ambulatory Visit: Payer: Self-pay

## 2023-07-16 ENCOUNTER — Other Ambulatory Visit (HOSPITAL_COMMUNITY): Payer: Self-pay

## 2023-07-26 ENCOUNTER — Encounter: Payer: Medicaid Other | Admitting: Family Medicine

## 2023-08-01 ENCOUNTER — Other Ambulatory Visit (HOSPITAL_COMMUNITY): Payer: Self-pay | Admitting: Adult Health

## 2023-08-01 DIAGNOSIS — I5043 Acute on chronic combined systolic (congestive) and diastolic (congestive) heart failure: Secondary | ICD-10-CM

## 2023-08-02 ENCOUNTER — Other Ambulatory Visit (HOSPITAL_COMMUNITY): Payer: Self-pay | Admitting: Adult Health

## 2023-08-02 ENCOUNTER — Other Ambulatory Visit: Payer: Self-pay

## 2023-08-02 DIAGNOSIS — I5043 Acute on chronic combined systolic (congestive) and diastolic (congestive) heart failure: Secondary | ICD-10-CM

## 2023-08-03 ENCOUNTER — Other Ambulatory Visit (HOSPITAL_COMMUNITY): Payer: Self-pay

## 2023-08-03 ENCOUNTER — Other Ambulatory Visit: Payer: Self-pay

## 2023-08-03 MED ORDER — EMPAGLIFLOZIN 10 MG PO TABS
10.0000 mg | ORAL_TABLET | Freq: Every day | ORAL | 2 refills | Status: DC
  Filled 2023-08-03: qty 30, 30d supply, fill #0
  Filled 2023-08-29: qty 30, 30d supply, fill #1
  Filled 2023-09-30: qty 30, 30d supply, fill #2

## 2023-08-15 ENCOUNTER — Other Ambulatory Visit (HOSPITAL_COMMUNITY): Payer: Self-pay | Admitting: Internal Medicine

## 2023-08-16 ENCOUNTER — Encounter: Payer: Self-pay | Admitting: Family Medicine

## 2023-08-16 ENCOUNTER — Other Ambulatory Visit: Payer: Self-pay

## 2023-08-16 ENCOUNTER — Ambulatory Visit: Attending: Family Medicine | Admitting: Family Medicine

## 2023-08-16 VITALS — BP 140/90 | HR 85 | Ht 70.0 in | Wt 307.4 lb

## 2023-08-16 DIAGNOSIS — E1165 Type 2 diabetes mellitus with hyperglycemia: Secondary | ICD-10-CM

## 2023-08-16 DIAGNOSIS — E66813 Obesity, class 3: Secondary | ICD-10-CM

## 2023-08-16 DIAGNOSIS — I110 Hypertensive heart disease with heart failure: Secondary | ICD-10-CM | POA: Diagnosis not present

## 2023-08-16 DIAGNOSIS — E1159 Type 2 diabetes mellitus with other circulatory complications: Secondary | ICD-10-CM

## 2023-08-16 DIAGNOSIS — L84 Corns and callosities: Secondary | ICD-10-CM | POA: Diagnosis not present

## 2023-08-16 DIAGNOSIS — Z794 Long term (current) use of insulin: Secondary | ICD-10-CM | POA: Diagnosis not present

## 2023-08-16 DIAGNOSIS — I152 Hypertension secondary to endocrine disorders: Secondary | ICD-10-CM

## 2023-08-16 DIAGNOSIS — I5040 Unspecified combined systolic (congestive) and diastolic (congestive) heart failure: Secondary | ICD-10-CM

## 2023-08-16 LAB — POCT GLYCOSYLATED HEMOGLOBIN (HGB A1C): HbA1c POC (<> result, manual entry): >15 % (ref 4.0–5.6)

## 2023-08-16 MED ORDER — DULOXETINE HCL 30 MG PO CPEP
30.0000 mg | ORAL_CAPSULE | Freq: Every day | ORAL | 1 refills | Status: DC
  Filled 2023-08-16: qty 90, 90d supply, fill #0

## 2023-08-16 MED ORDER — OZEMPIC (0.25 OR 0.5 MG/DOSE) 2 MG/3ML ~~LOC~~ SOPN
0.2500 mg | PEN_INJECTOR | SUBCUTANEOUS | 0 refills | Status: AC
Start: 2023-08-16 — End: ?
  Filled 2023-08-16: qty 3, 42d supply, fill #0

## 2023-08-16 MED ORDER — LANTUS SOLOSTAR 100 UNIT/ML ~~LOC~~ SOPN
15.0000 [IU] | PEN_INJECTOR | Freq: Every day | SUBCUTANEOUS | 6 refills | Status: DC
  Filled 2023-08-16: qty 6, 40d supply, fill #0

## 2023-08-16 MED ORDER — OZEMPIC (0.25 OR 0.5 MG/DOSE) 2 MG/3ML ~~LOC~~ SOPN
0.5000 mg | PEN_INJECTOR | SUBCUTANEOUS | 0 refills | Status: DC
  Filled 2023-08-16 – 2023-08-29 (×2): qty 3, fill #0
  Filled 2023-10-02: qty 3, 28d supply, fill #0

## 2023-08-16 MED ORDER — SEMAGLUTIDE (1 MG/DOSE) 4 MG/3ML ~~LOC~~ SOPN
1.0000 mg | PEN_INJECTOR | SUBCUTANEOUS | 3 refills | Status: DC
  Filled 2023-08-16 – 2023-08-29 (×2): qty 3, 28d supply, fill #0

## 2023-08-16 MED ORDER — GABAPENTIN 300 MG PO CAPS
300.0000 mg | ORAL_CAPSULE | Freq: Three times a day (TID) | ORAL | 1 refills | Status: DC
  Filled 2023-08-16 – 2023-09-13 (×2): qty 270, 90d supply, fill #0

## 2023-08-16 MED ORDER — METFORMIN HCL 1000 MG PO TABS
1000.0000 mg | ORAL_TABLET | Freq: Two times a day (BID) | ORAL | 1 refills | Status: AC
  Filled 2023-08-16: qty 180, 90d supply, fill #0
  Filled 2023-11-12: qty 180, 90d supply, fill #1

## 2023-08-16 NOTE — Patient Instructions (Signed)
 VISIT SUMMARY:  During your visit, we discussed your diabetes management, blood pressure, and heart health. We also addressed your numbness and pain in your hands and feet, and reviewed your medications and recent health changes.  YOUR PLAN:  -TYPE 2 DIABETES MELLITUS WITH DIABETIC NEUROPATHY: Your diabetes is not well controlled, and your A1c level is high at 15%. This has led to diabetic neuropathy, causing numbness in your extremities. We have restarted your Lantus  at 15 units at bedtime and metformin  at 1000 mg twice daily. You will also start Ozempic  at 0.25 mg for four weeks, then increase to 0.5 mg, and eventually to 1 mg as tolerated. Continue taking Jardiance  as prescribed. We have ordered tests for kidney function, liver function, cholesterol, and a urinalysis. You will follow up with a pharmacist in one month for Ozempic  dosing and blood sugar control. Additionally, we have prescribed Cymbalta  for your neuropathy and referred you to a podiatrist for foot care.  -HYPERTENSION: Your blood pressure was elevated at 140/90 mmHg during the visit, although it is usually lower at home. We will repeat the blood pressure measurement before you leave. Continue taking your current blood pressure medications and monitor your blood pressure regularly at home.  -CONGESTIVE HEART FAILURE: You have some ankle swelling and weight fluctuations, but no significant shortness of breath or chest pain. We have ordered a BNP test to check your heart function and you should continue taking furosemide  as prescribed.  -GENERAL HEALTH MAINTENANCE: You are due for an eye exam and we need to update your chart with information about your last colonoscopy. We will refer you for an eye exam and request details about your last colonoscopy.  INSTRUCTIONS:  Please follow up with the pharmacist in one month for Ozempic  dosing and blood sugar control. We will also repeat your blood pressure measurement before you leave today.  Continue monitoring your blood pressure at home and take your medications as prescribed. Make sure to schedule an eye exam and provide information about your last colonoscopy for our records.

## 2023-08-16 NOTE — Progress Notes (Signed)
 Subjective:  Patient ID: Terry Young, male    DOB: 10-10-1970  Age: 53 y.o. MRN: [id number]  CC: Medical Management of Chronic Issues (Numbness in feet and legs.)     Discussed the use of AI scribe software for clinical note transcription with the patient, who gave verbal consent to proceed.  History of Present Illness Terry Young is a 53 year old male with a history of HFrEF (EF 55 to 60%, mild LVH from echo of 11/2021), morbid obesity, type 2 diabetes mellitus  who presents with numbness in his hands and feet.  He experiences numbness in his hands, feet, and legs, particularly below the knee, described as 'stepping on stairs'. This affects his ability to walk on sidewalks. Gabapentin  alleviates shooting pains but not numbness or heaviness.  He has not taken Lantus  for several months due to missed clinic visits. His A1c has increased from 7.6 two years ago to 15. He was on 15 units of Lantus  at bedtime and 1000 mg of metformin  twice daily. He was prescribed Ozempic  but could not afford it because he had no insurance previously. He continues to take Jardiance  as prescribed by his cardiologist.  His blood pressure is usually low at home, around 110-115 systolic, but was high during the visit. He took his blood pressure medication just before the appointment and monitors it regularly at home.  He experiences some ankle swelling and takes furosemide  for fluid management. His weight fluctuates, having lost seven pounds last week but gaining two or three pounds back.  He denies presence of orthopnea, paroxysmal nocturnal dyspnea.  No current chest pain or shortness of breath.    Past Medical History:  Diagnosis Date   CHF (congestive heart failure) (HCC)    Diabetes mellitus without complication (HCC)    Hypertension     No past surgical history on file.  Family History  Problem Relation Age of Onset   Atrial fibrillation Neg Hx     Social History   Socioeconomic History   Marital  status: Single    Spouse name: Not on file   Number of children: Not on file   Years of education: Not on file   Highest education level: Not on file  Occupational History   Not on file  Tobacco Use   Smoking status: Never   Smokeless tobacco: Never  Substance and Sexual Activity   Alcohol use: Never   Drug use: Never   Sexual activity: Not on file  Other Topics Concern   Not on file  Social History Narrative   Not on file   Social Drivers of Health   Financial Resource Strain: High Risk (02/07/2021)   Overall Financial Resource Strain (CARDIA)    Difficulty of Paying Living Expenses: Very hard  Food Insecurity: Food Insecurity Present (06/29/2021)   Hunger Vital Sign    Worried About Running Out of Food in the Last Year: Sometimes true    Ran Out of Food in the Last Year: Never true  Transportation Needs: Unmet Transportation Needs (03/16/2022)   PRAPARE - Administrator, Civil Service (Medical): Yes    Lack of Transportation (Non-Medical): Yes  Physical Activity: Not on file  Stress: Not on file  Social Connections: Not on file    Allergies  Allergen Reactions   Lisinopril Cough    Outpatient Medications Prior to Visit  Medication Sig Dispense Refill   acetaminophen  (TYLENOL ) 325 MG tablet Take 650 mg by mouth as needed.  aspirin  81 MG EC tablet Take 1 tablet (81 mg total) by mouth daily. 90 tablet 0   atorvastatin  (LIPITOR) 40 MG tablet Take 1 tablet (40 mg total) by mouth daily. 90 tablet 3   carvedilol  (COREG ) 12.5 MG tablet Take 1 tablet (12.5 mg total) by mouth 2 (two) times daily with a meal. NEEDS FOLLOW UP APPOINTMENT FOR MORE REFILLS 180 tablet 0   empagliflozin  (JARDIANCE ) 10 MG TABS tablet Take 1 tablet (10 mg total) by mouth daily before breakfast. NEEDS FOLLOW UP APPOINTMENT FOR MORE REFILLS 30 tablet 2   furosemide  (LASIX ) 40 MG tablet Take 1 tablet (40 mg total) by mouth daily. NEEDS FOLLOW UP APPOINTMENT FOR MORE REFILLS 30 tablet 1    hydrALAZINE  (APRESOLINE ) 25 MG tablet Take 1 tablet (25 mg total) by mouth 3 (three) times daily. 270 tablet 3   Insulin  Pen Needle 32G X 4 MM MISC Use to inject insulin  as directed. 100 each 0   isosorbide  mononitrate (IMDUR ) 30 MG 24 hr tablet Take 1 tablet (30 mg total) by mouth daily. 30 tablet 3   Multiple Vitamins-Minerals (CENTRUM MEN) TABS Take 1 tablet by mouth daily.     sacubitril -valsartan  (ENTRESTO ) 97-103 MG Take 1 tablet by mouth 2 (two) times daily. Please call  office to schedule an appointment 60 tablet 0   gabapentin  (NEURONTIN ) 300 MG capsule Take 1 capsule (300 mg total) by mouth 3 (three) times daily. 270 capsule 0   insulin  glargine (LANTUS  SOLOSTAR) 100 UNIT/ML Solostar Pen Inject 15 Units into the skin at bedtime. Needs office visit 15 mL 0   metFORMIN  (GLUCOPHAGE ) 1000 MG tablet Take 1 tablet (1,000 mg total) by mouth 2 (two) times daily with a meal. Must have office visit for refills 60 tablet 0   No facility-administered medications prior to visit.     ROS Review of Systems  Constitutional:  Negative for activity change and appetite change.  HENT:  Negative for sinus pressure and sore throat.   Respiratory:  Negative for chest tightness, shortness of breath and wheezing.   Cardiovascular:  Positive for leg swelling. Negative for chest pain and palpitations.  Gastrointestinal:  Negative for abdominal distention, abdominal pain and constipation.  Genitourinary: Negative.   Musculoskeletal: Negative.   Neurological:  Positive for numbness.  Psychiatric/Behavioral:  Negative for behavioral problems and dysphoric mood.     Objective:  BP (!) 140/90   Pulse 85   Ht 5\' 10"  (1.778 m)   Wt (!) 307 lb 6.4 oz (139.4 kg)   SpO2 98%   BMI 44.11 kg/m      08/16/2023   10:27 AM 08/16/2023    9:56 AM 08/03/2022    1:44 PM  BP/Weight  Systolic BP 140 163 124  Diastolic BP 90 106 80  Wt. (Lbs)  307.4 302.6  BMI  44.11 kg/m2 43.42 kg/m2      Physical  Exam Constitutional:      Appearance: He is well-developed.  Cardiovascular:     Rate and Rhythm: Normal rate.     Heart sounds: Normal heart sounds. No murmur heard. Pulmonary:     Effort: Pulmonary effort is normal.     Breath sounds: Normal breath sounds. No wheezing or rales.  Chest:     Chest wall: No tenderness.  Abdominal:     General: Bowel sounds are normal. There is no distension.     Palpations: Abdomen is soft. There is no mass.     Tenderness: There is no  abdominal tenderness.  Musculoskeletal:        General: Normal range of motion.     Right lower leg: Edema present.     Left lower leg: Edema present.  Neurological:     Mental Status: He is alert and oriented to person, place, and time.  Psychiatric:        Mood and Affect: Mood normal.        Latest Ref Rng & Units 08/03/2022    2:19 PM 03/16/2022    2:05 PM 12/14/2021    2:31 PM  CMP  Glucose 70 - 99 mg/dL 161  096  045   BUN 6 - 20 mg/dL 19  28  28    Creatinine 0.61 - 1.24 mg/dL 4.09  8.11  9.14   Sodium 135 - 145 mmol/L 138  140  138   Potassium 3.5 - 5.1 mmol/L 3.8  5.8  5.0   Chloride 98 - 111 mmol/L 103  107  106   CO2 22 - 32 mmol/L 25  24  23    Calcium  8.9 - 10.3 mg/dL 8.6  9.3  9.2     Lipid Panel  No results found for: "CHOL", "TRIG", "HDL", "CHOLHDL", "VLDL", "LDLCALC", "LDLDIRECT"  CBC    Component Value Date/Time   WBC 9.0 01/27/2021 0153   RBC 5.53 01/27/2021 0153   HGB 16.4 01/27/2021 0153   HCT 49.8 01/27/2021 0153   PLT 215 01/27/2021 0153   MCV 90.1 01/27/2021 0153   MCH 29.7 01/27/2021 0153   MCHC 32.9 01/27/2021 0153   RDW 13.2 01/27/2021 0153   LYMPHSABS 1.5 01/26/2021 1655   MONOABS 0.6 01/26/2021 1655   EOSABS 0.1 01/26/2021 1655   BASOSABS 0.1 01/26/2021 1655    Lab Results  Component Value Date   HGBA1C >15.0 08/16/2023      Assessment and Plan Assessment & Plan Type 2 diabetes mellitus with diabetic neuropathy Diabetes poorly controlled, A1c 15%.  Neuropathy with numbness in extremities. Gabapentin  partially effective for pain. Prescription issues resolved with Medicaid. Initiated Ozempic  for diabetes and weight management. - Restart Lantus  15 units at bedtime. - Restart metformin  1000 mg twice daily. - Initiate Ozempic  0.25 mg for four weeks, increase to 0.5 mg, then 1 mg as tolerated. - Continue Jardiance  as prescribed. - Order kidney function, liver function, cholesterol tests, urinalysis. - Refer to pharmacist for follow-up in one month for Ozempic  dosing and blood sugar control. - Prescribe Cymbalta  for neuropathy. - Refer to podiatrist for foot care.  Hypertension Blood pressure elevated at 140/90 mmHg in clinic, lower at home. Antihypertensive taken before visit. - Repeat blood pressure measurement before leaving. - Continue current antihypertensive medications. - Encourage regular home blood pressure monitoring. -Counseled on blood pressure goal of less than 130/80, low-sodium, DASH diet, medication compliance, 150 minutes of moderate intensity exercise per week. Discussed medication compliance, adverse effects.   Congestive heart failure Euvolemic with EF of 55 to 60% from echo 11/2021.   Some ankle swelling and weight fluctuations. On furosemide . No significant shortness of breath or orthopnea. - Order BNP test. - Continue furosemide . - Continue SGLT2 inhibitor, beta-blocker, hydralazine , Entresto    Obesity He will need to work on caloric restriction and increasing physical activity - Continue GLP-1 RA   General Health Maintenance Due for eye exam. Uncertain about last colonoscopy details. - Refer for eye exam. - Request information about last colonoscopy for chart update.      Meds ordered this encounter  Medications   insulin   glargine (LANTUS  SOLOSTAR) 100 UNIT/ML Solostar Pen    Sig: Inject 15 Units into the skin at bedtime.    Dispense:  30 mL    Refill:  6   metFORMIN  (GLUCOPHAGE ) 1000 MG tablet     Sig: Take 1 tablet (1,000 mg total) by mouth 2 (two) times daily with a meal. Must have office visit for refills    Dispense:  180 tablet    Refill:  1   gabapentin  (NEURONTIN ) 300 MG capsule    Sig: Take 1 capsule (300 mg total) by mouth 3 (three) times daily.    Dispense:  270 capsule    Refill:  1   Semaglutide ,0.25 or 0.5MG /DOS, (OZEMPIC , 0.25 OR 0.5 MG/DOSE,) 2 MG/3ML SOPN    Sig: Inject 0.25 mg into the skin once a week, for 4 weeks. After 4 weeks increase to 0.5mg  once a week.    Dispense:  3 mL    Refill:  0    Ozempic  package size not 2ml- changed to 3ml   DULoxetine  (CYMBALTA ) 30 MG capsule    Sig: Take 1 capsule (30 mg total) by mouth daily. For neuropathy    Dispense:  90 capsule    Refill:  1   Semaglutide ,0.25 or 0.5MG /DOS, (OZEMPIC , 0.25 OR 0.5 MG/DOSE,) 2 MG/3ML SOPN    Sig: Inject 0.5 mg into the skin once a week. For 4 weeks then increase to 1mg     Dispense:  3 mL    Refill:  0   Semaglutide , 1 MG/DOSE, 4 MG/3ML SOPN    Sig: Inject 1 mg as directed once a week.    Dispense:  3 mL    Refill:  3    Follow-up: Return in about 1 month (around 09/16/2023) for Blood sugar evaluation with [place], Medical conditions with PCP, in 3 months.       Joaquin Mulberry, MD, FAAFP. [facility] [place], [place] [phone]   08/16/2023, 12:55 PM

## 2023-08-17 ENCOUNTER — Other Ambulatory Visit: Payer: Self-pay

## 2023-08-17 ENCOUNTER — Encounter: Payer: Self-pay | Admitting: Family Medicine

## 2023-08-17 LAB — CMP14+EGFR
ALT: 11 IU/L (ref 0–44)
AST: 12 IU/L (ref 0–40)
Albumin: 4.3 g/dL (ref 4.1–5.1)
Alkaline Phosphatase: 112 IU/L (ref 44–121)
BUN/Creatinine Ratio: 12 (ref 9–20)
BUN: 18 mg/dL (ref 6–24)
Bilirubin Total: 0.7 mg/dL (ref 0.0–1.2)
CO2: 18 mmol/L — ABNORMAL LOW (ref 20–29)
Calcium: 9.3 mg/dL (ref 8.7–10.2)
Chloride: 99 mmol/L (ref 96–106)
Creatinine, Ser: 1.51 mg/dL — ABNORMAL HIGH (ref 0.76–1.27)
Globulin, Total: 3.2 g/dL (ref 1.5–4.5)
Glucose: 319 mg/dL — ABNORMAL HIGH (ref 70–99)
Potassium: 4.4 mmol/L (ref 3.5–5.2)
Sodium: 136 mmol/L (ref 134–144)
Total Protein: 7.5 g/dL (ref 6.0–8.5)
eGFR: 56 mL/min/{1.73_m2} — ABNORMAL LOW (ref >59)

## 2023-08-17 LAB — PRO B NATRIURETIC PEPTIDE: NT-Pro BNP: 99 pg/mL (ref 0–121)

## 2023-08-17 LAB — LP+NON-HDL CHOLESTEROL
Cholesterol, Total: 212 mg/dL — ABNORMAL HIGH (ref 100–199)
HDL: 35 mg/dL — ABNORMAL LOW (ref >39)
LDL Chol Calc (NIH): 127 mg/dL — ABNORMAL HIGH (ref 0–99)
Total Non-HDL-Chol (LDL+VLDL): 177 mg/dL — ABNORMAL HIGH (ref 0–129)
Triglycerides: 278 mg/dL — ABNORMAL HIGH (ref 0–149)
VLDL Cholesterol Cal: 50 mg/dL — ABNORMAL HIGH (ref 5–40)

## 2023-08-17 LAB — MICROALBUMIN / CREATININE URINE RATIO
Creatinine, Urine: 64.6 mg/dL
Microalb/Creat Ratio: 23 mg/g{creat} (ref 0–29)
Microalbumin, Urine: 14.6 ug/mL

## 2023-08-17 MED ORDER — ENTRESTO 97-103 MG PO TABS
1.0000 | ORAL_TABLET | Freq: Two times a day (BID) | ORAL | 0 refills | Status: DC
  Filled 2023-08-17 (×2): qty 60, 30d supply, fill #0

## 2023-08-17 MED ORDER — ISOSORBIDE MONONITRATE ER 30 MG PO TB24
30.0000 mg | ORAL_TABLET | Freq: Every day | ORAL | 1 refills | Status: DC
  Filled 2023-08-17 (×2): qty 30, 30d supply, fill #0
  Filled 2023-09-12: qty 30, 30d supply, fill #1

## 2023-08-20 ENCOUNTER — Other Ambulatory Visit: Payer: Self-pay

## 2023-08-20 ENCOUNTER — Telehealth: Payer: Self-pay

## 2023-08-20 NOTE — Telephone Encounter (Signed)
 Pharmacy Patient Advocate Encounter   Received notification from CoverMyMeds that prior authorization for OZEMPIC  is required/requested.   Insurance verification completed.   The patient is insured through [facility] MEDICAID .   Per test claim: PA required; PA submitted to above mentioned insurance via CoverMyMeds Key/confirmation #/EOC [id number] Status is pending

## 2023-08-20 NOTE — Telephone Encounter (Signed)
 Pharmacy Patient Advocate Encounter  Received notification from [facility] MEDICAID that Prior Authorization for OZEMPIC  has been APPROVED from 08/20/2023 to 08/19/2024   PA #/Case ID/Reference #: [id number]

## 2023-08-21 ENCOUNTER — Other Ambulatory Visit (HOSPITAL_COMMUNITY): Payer: Self-pay

## 2023-08-21 ENCOUNTER — Other Ambulatory Visit: Payer: Self-pay

## 2023-08-29 ENCOUNTER — Other Ambulatory Visit: Payer: Self-pay

## 2023-08-29 ENCOUNTER — Other Ambulatory Visit (HOSPITAL_COMMUNITY): Payer: Self-pay | Admitting: Adult Health

## 2023-08-29 DIAGNOSIS — I5043 Acute on chronic combined systolic (congestive) and diastolic (congestive) heart failure: Secondary | ICD-10-CM

## 2023-08-30 ENCOUNTER — Other Ambulatory Visit: Payer: Self-pay

## 2023-08-30 MED ORDER — CARVEDILOL 12.5 MG PO TABS
12.5000 mg | ORAL_TABLET | Freq: Two times a day (BID) | ORAL | 0 refills | Status: DC
  Filled 2023-08-30: qty 180, 90d supply, fill #0

## 2023-08-31 ENCOUNTER — Other Ambulatory Visit: Payer: Self-pay

## 2023-09-03 ENCOUNTER — Other Ambulatory Visit (HOSPITAL_COMMUNITY): Payer: Self-pay

## 2023-09-03 ENCOUNTER — Other Ambulatory Visit: Payer: Self-pay

## 2023-09-03 ENCOUNTER — Telehealth (HOSPITAL_COMMUNITY): Payer: Self-pay | Admitting: Pharmacy Technician

## 2023-09-03 NOTE — Telephone Encounter (Signed)
 Advanced Heart Failure Patient Advocate Encounter  Prior Authorization for Jardiance  has been approved.    PA# [id number] Effective dates: 09/03/23 through 09/02/24  Patients co-pay is $4 (90 days)  Correne Dillon, CPhT

## 2023-09-03 NOTE — Telephone Encounter (Signed)
 Patient Advocate Encounter   Received notification from [facility] that prior authorization for Jardiance  is required.   PA submitted on CoverMyMeds Key [id number] Status is pending   Will continue to follow.

## 2023-09-04 ENCOUNTER — Other Ambulatory Visit: Payer: Self-pay

## 2023-09-12 ENCOUNTER — Other Ambulatory Visit (HOSPITAL_COMMUNITY): Payer: Self-pay | Admitting: Internal Medicine

## 2023-09-12 IMAGING — DX DG CHEST 2V
2 series · 2 of 2 positions shown · non-contrast
Comparison: 06/04/2020

CLINICAL DATA: Shortness of breath, leg and abdomen swelling for
1.5 weeks, chest pain, history of fluid on the lungs

EXAM:
CHEST - 2 VIEW

[chest pa]
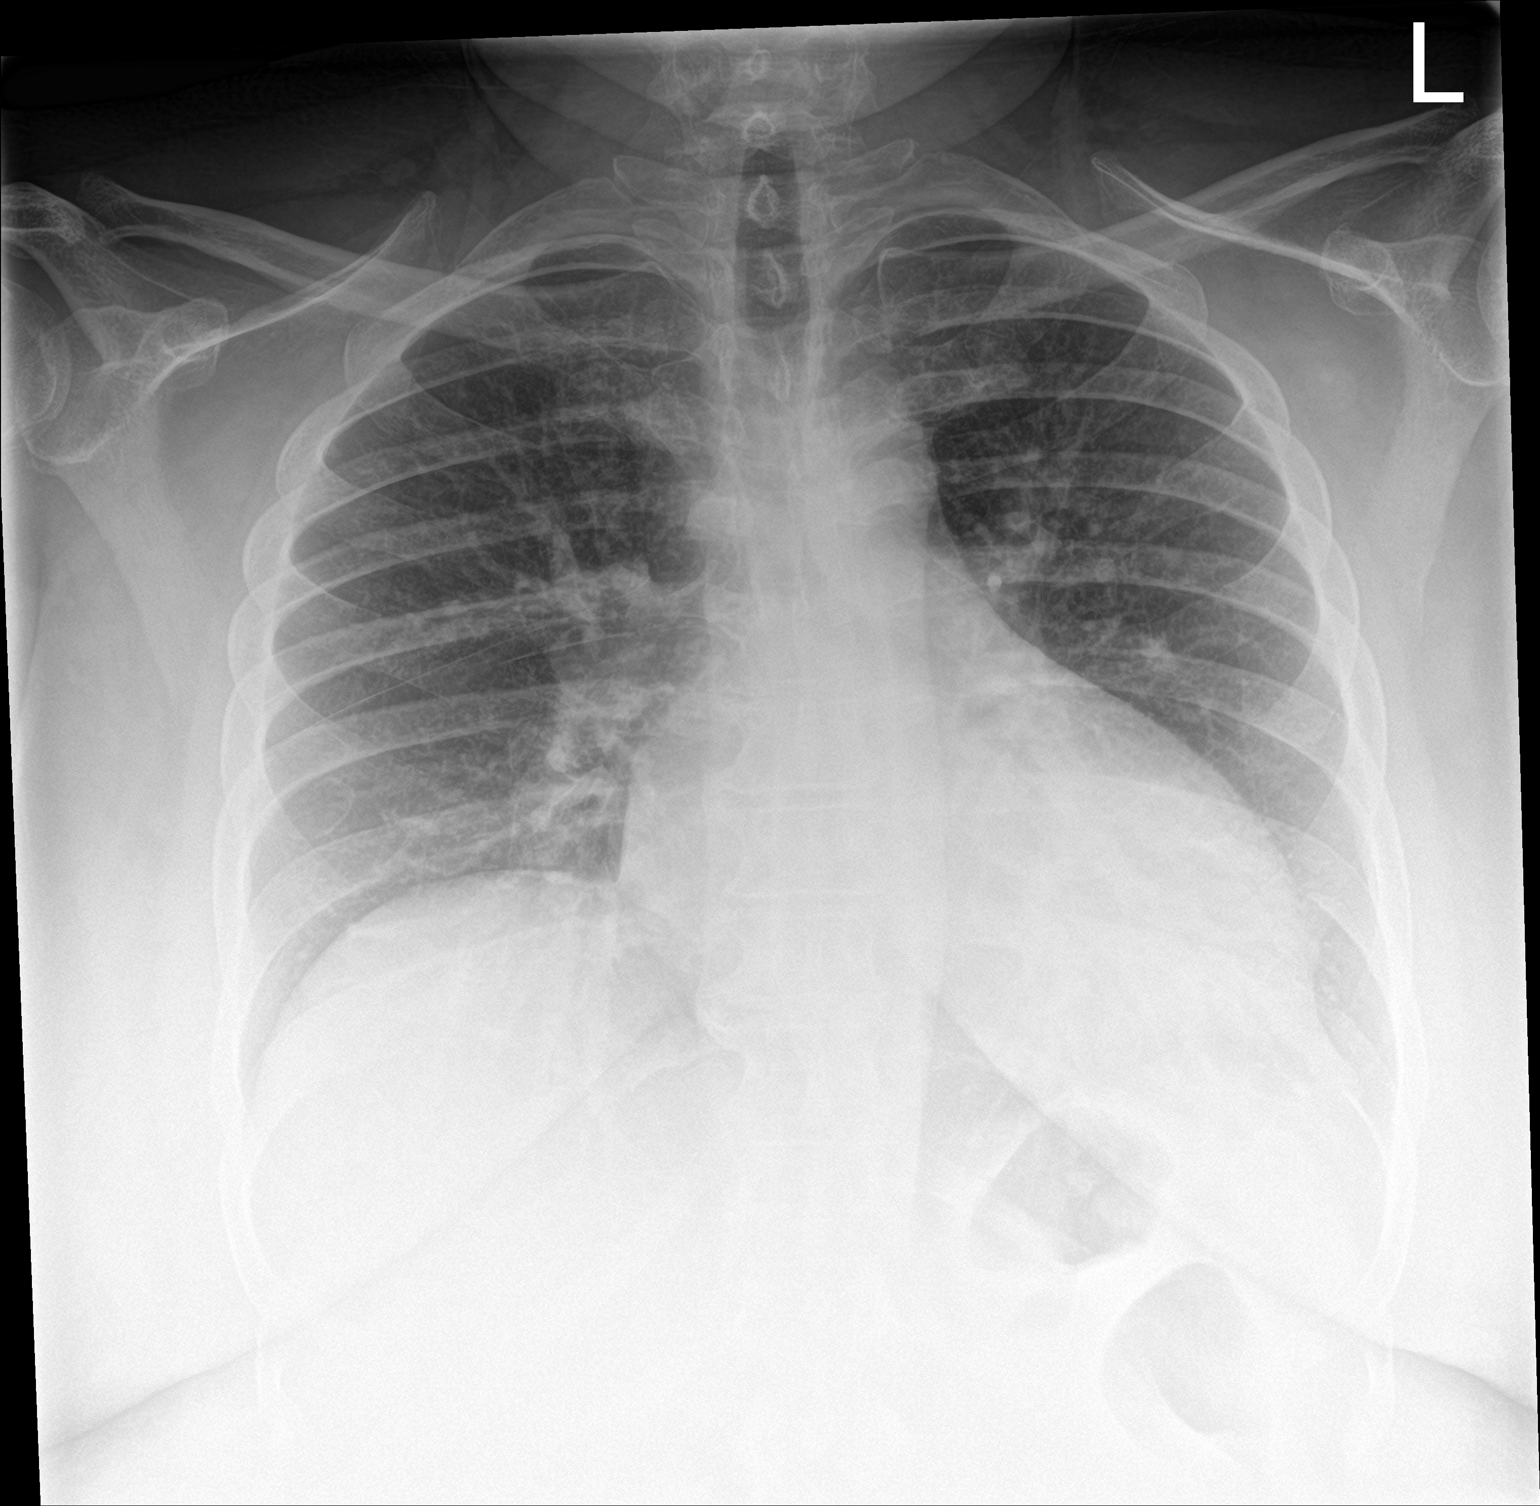

[chest lat]
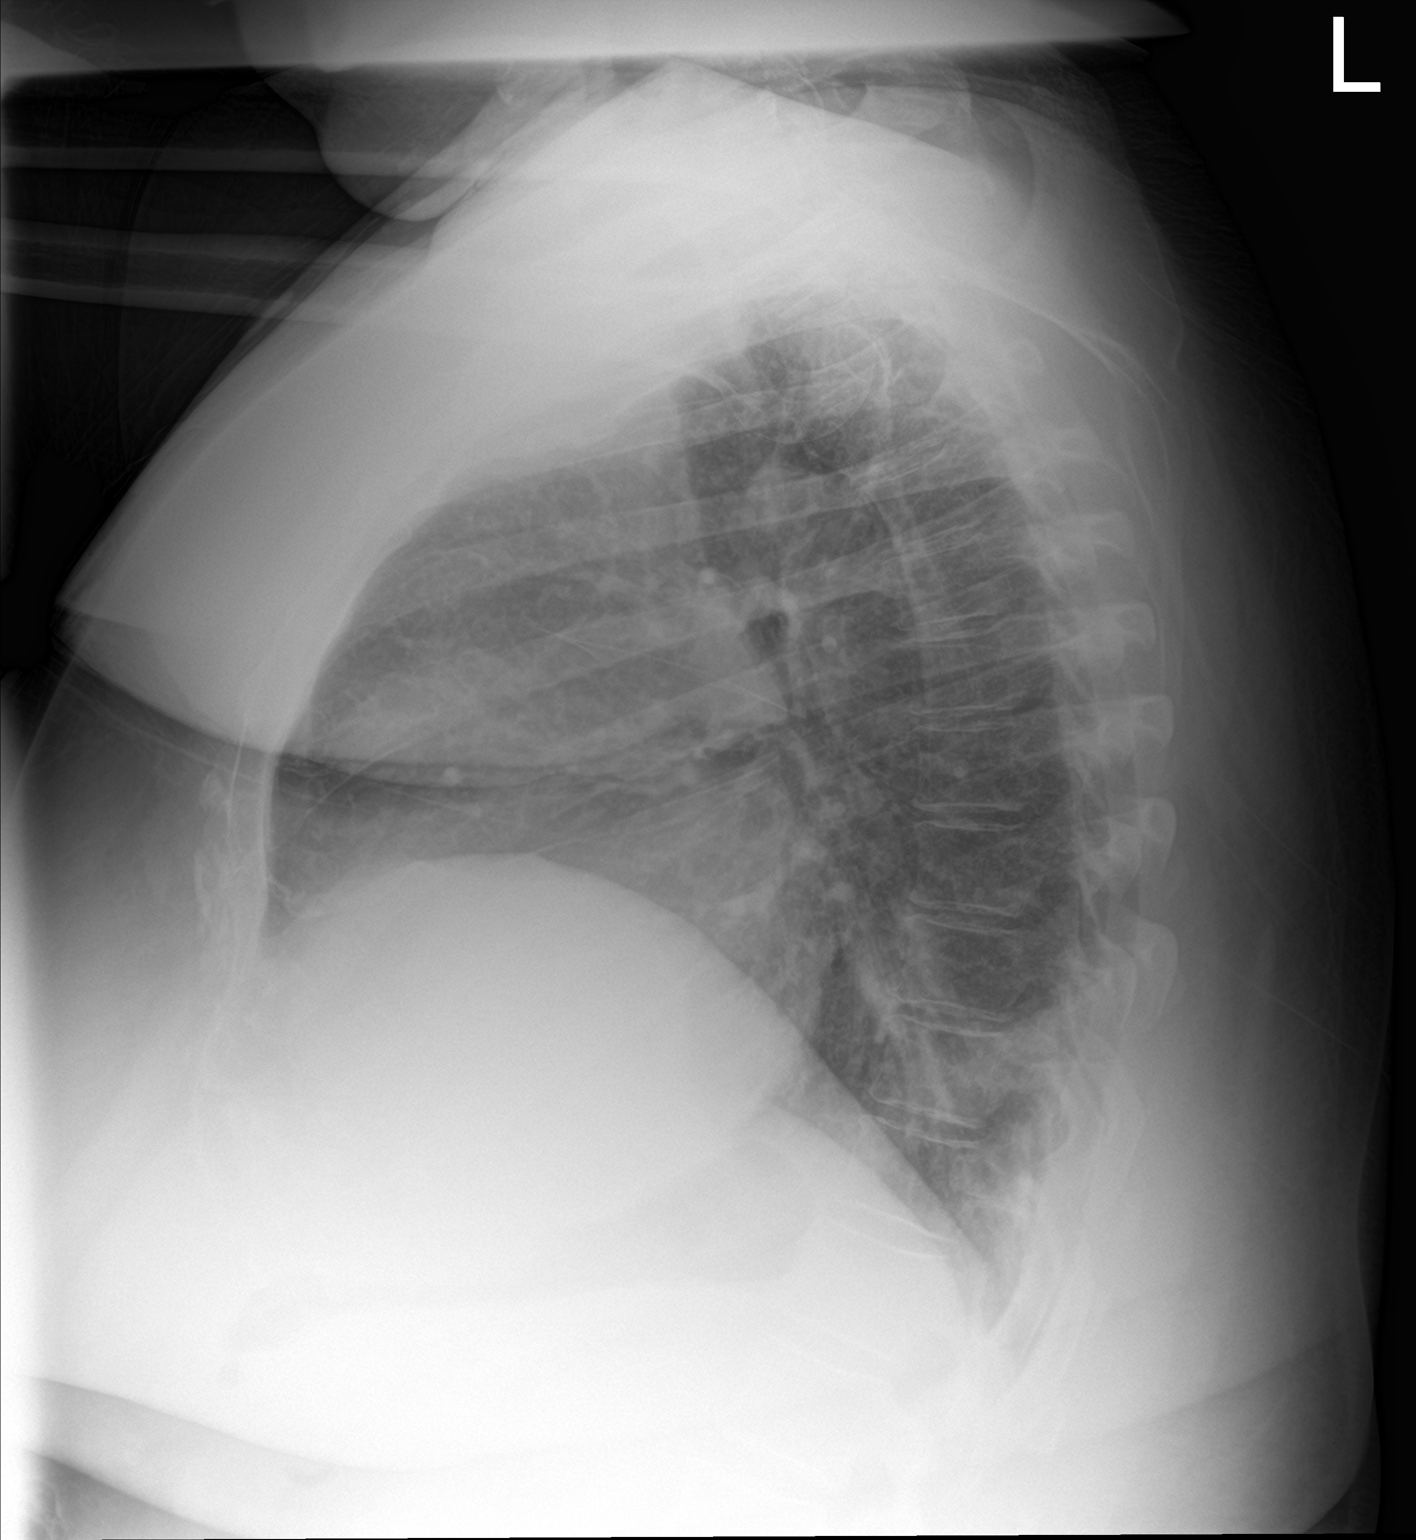

[2 of 2 positions shown; findings below may reference images not displayed]

FINDINGS: Enlargement of cardiac silhouette with pulmonary vascular
congestion.

Mediastinal contours normal.

Minimal RIGHT basilar atelectasis.

Lungs otherwise clear.

No infiltrate, pleural effusion, or pneumothorax.
IMPRESSION: Enlargement of cardiac silhouette with pulmonary vascular
congestion.

Minimal RIGHT basilar atelectasis.

## 2023-09-13 ENCOUNTER — Other Ambulatory Visit: Payer: Self-pay

## 2023-09-14 ENCOUNTER — Other Ambulatory Visit: Payer: Self-pay

## 2023-09-14 MED ORDER — FUROSEMIDE 40 MG PO TABS
40.0000 mg | ORAL_TABLET | Freq: Every day | ORAL | 1 refills | Status: DC
  Filled 2023-09-14: qty 30, 30d supply, fill #0
  Filled 2023-10-10: qty 30, 30d supply, fill #1

## 2023-09-14 MED ORDER — ENTRESTO 97-103 MG PO TABS
1.0000 | ORAL_TABLET | Freq: Two times a day (BID) | ORAL | 0 refills | Status: DC
Start: 2023-09-14 — End: 2023-10-15
  Filled 2023-09-14: qty 60, 30d supply, fill #0

## 2023-09-17 ENCOUNTER — Encounter: Payer: Self-pay | Admitting: Pharmacist

## 2023-09-17 ENCOUNTER — Other Ambulatory Visit: Payer: Self-pay

## 2023-09-17 ENCOUNTER — Ambulatory Visit: Attending: Family Medicine | Admitting: Pharmacist

## 2023-09-17 DIAGNOSIS — Z7985 Long-term (current) use of injectable non-insulin antidiabetic drugs: Secondary | ICD-10-CM | POA: Diagnosis not present

## 2023-09-17 DIAGNOSIS — Z7984 Long term (current) use of oral hypoglycemic drugs: Secondary | ICD-10-CM | POA: Diagnosis not present

## 2023-09-17 DIAGNOSIS — Z794 Long term (current) use of insulin: Secondary | ICD-10-CM

## 2023-09-17 DIAGNOSIS — E1165 Type 2 diabetes mellitus with hyperglycemia: Secondary | ICD-10-CM

## 2023-09-17 MED ORDER — LANTUS SOLOSTAR 100 UNIT/ML ~~LOC~~ SOPN
10.0000 [IU] | PEN_INJECTOR | Freq: Every day | SUBCUTANEOUS | 6 refills | Status: DC
  Filled 2023-09-17: qty 30, 300d supply, fill #0
  Filled 2023-09-30: qty 9, 84d supply, fill #0

## 2023-09-17 NOTE — Progress Notes (Signed)
 S:     No chief complaint on file.  53 y.o. male who presents for diabetes evaluation, education, and management. Patient arrives in good spirits and presents without any assistance.   Patient was referred and last seen by Primary Care Provider, Dr. Adan Holms, on 08/16/2023. A1c at that visit was >15%. PMH is significant for HTN, nonischemic cardiomyopathy (dx'd with CHF/HFrEF in 2015, last EF 55-60%), and T2DM. He was found to be without medications. We restarted his insulin  + metformin . Ozempic  was added. Patient had been on Jardiance  previously under the care of the heart failure clinic.   Today, patient is doing well since his last visit with Dr. Newlin. He is tolerating Ozempic  well. Denies any vision changes, abdominal pain, NV.   Family/Social History:  Fhx: a fib  Tobacco: never smoker  Alcohol: none   Current diabetes medications include: empagliflozin  10 mg daily, Lantus  15 units daily, metformin  1000 mg BID, Ozempic  0.25 mg weekly  Patient reports adherence to taking all medications as prescribed.   Insurance coverage: [place] Medicaid  Patient reports hypoglycemic events. Gives objective levels in the 30s-40s. I cannot verify these - no GM/CGM with him today.  Reported home blood sugars: 30s - 150s  Patient denies nocturia (nighttime urination).  Patient reports stable neuropathy (nerve pain). Patient denies visual changes.  Patient reported dietary habits: Eats 2-3 meals/day -Now back on track with his diet -Compliant with DM diet and salt restriction   Patient-reported exercise habits:  -Reports doing exercises at home but no specific amount of time given  O:   Lab Results  Component Value Date   HGBA1C >15.0 08/16/2023   There were no vitals filed for this visit.  Lipid Panel     Component Value Date/Time   CHOL 212 (H) 08/16/2023 1046   TRIG 278 (H) 08/16/2023 1046   HDL 35 (L) 08/16/2023 1046   LDLCALC 127 (H) 08/16/2023 1046    Clinical  Atherosclerotic Cardiovascular Disease (ASCVD): No  The 10-year ASCVD risk score (Arnett DK, et al., 2019) is: 21%   Values used to calculate the score:     Age: 73 years     Sex: Male     Is Non-Hispanic African American: Yes     Diabetic: Yes     Tobacco smoker: No     Systolic Blood Pressure: 140 mmHg     Is BP treated: Yes     HDL Cholesterol: 35 mg/dL     Total Cholesterol: 212 mg/dL   Patient is participating in a Managed Medicaid Plan: No    A/P: Diabetes longstanding currently uncontrolled based on A1c, however, he seems to be doing much better since his PCP visit last month. Compliant with medications. Patient is not currently hypoglycemic but endorses a hx of severe hypoglycemia at home. This doesn't sound to be occurring frequently, but I am unable to verify any readings today. He does not have his GM with him. He is tolerating the Ozempic  well. We will continue to titrate this up and work our way back on the insulin  dose. He is able to verbalize appropriate hypoglycemia management plan.  -Decreased dose of Lantus  to 10 units daily. -Increased Ozempic  dose to 0.5 mg weekly.  -Continue metformin  and Jardiance  at current doses.  -Patient educated on purpose, proper use, and potential adverse effects of Lantus , Ozempic .  -Extensively discussed pathophysiology of diabetes, recommended lifestyle interventions, dietary effects on blood sugar control.  -Counseled on s/sx of and management of hypoglycemia.  -  Next A1c anticipated 11/2023.   Written patient instructions provided. Patient verbalized understanding of treatment plan.  Total time in face to face counseling 30 minutes.    Follow-up:  Pharmacist in 4-6 weeks.  Marene Shape, PharmD, Becky Bowels, CPP Clinical Pharmacist [facility] [phone]

## 2023-09-21 ENCOUNTER — Encounter: Payer: Self-pay | Admitting: Podiatry

## 2023-09-21 ENCOUNTER — Ambulatory Visit (INDEPENDENT_AMBULATORY_CARE_PROVIDER_SITE_OTHER): Admitting: Podiatry

## 2023-09-21 DIAGNOSIS — E1151 Type 2 diabetes mellitus with diabetic peripheral angiopathy without gangrene: Secondary | ICD-10-CM

## 2023-09-21 DIAGNOSIS — M25572 Pain in left ankle and joints of left foot: Secondary | ICD-10-CM

## 2023-09-21 DIAGNOSIS — B351 Tinea unguium: Secondary | ICD-10-CM

## 2023-09-21 DIAGNOSIS — M25571 Pain in right ankle and joints of right foot: Secondary | ICD-10-CM

## 2023-09-21 DIAGNOSIS — M79671 Pain in right foot: Secondary | ICD-10-CM

## 2023-09-21 DIAGNOSIS — M79672 Pain in left foot: Secondary | ICD-10-CM | POA: Diagnosis not present

## 2023-09-21 NOTE — Progress Notes (Signed)
 Patient presents for evaluation and treatment of tenderness and some redness around nails feet.  Tenderness around toes with walking and wearing shoes.  He says the big toenail on the right hallux gets very thick and painful at times.  Also complains of pain around the ankles bilaterally indicates in the area of the sinus tarsi.  Physical exam:  General appearance: Alert, pleasant, and in no acute distress.  Vascular: Pedal pulses: DP 2/4 B/L, PT 0/4 B/L.  Moderate edema lower legs bilaterally  Neurological:  Light touch intact bilaterally.  Diminished Achilles tendon reflex bilaterally.  No clonus or spasticity noted.  Dermatologic:  Nails thickened, disfigured, discolored 1-5 BL with subungual debris.  Redness and hypertrophic nail folds along nail folds bilaterally but no signs of drainage or infection.  Hallux nail right is very thickened and dystrophic with redness along the nail folds.  No signs of infection or drainage.  Callusing on the plantar aspect of the heel right.  Skin thin and atrophic with hyperpigmentation bilaterally no hair growth lower extremity bilaterally.  Musculoskeletal:  Tenderness to palpation in the sinus tarsi and along the subtalar joint bilaterally.  Some soreness with range of motion subtalar joint bilaterally.  No tenderness around the ankle proper.  Hammertoe deformities deformities 2 through 5 bilaterally   Diagnosis: 1.  Arthralgia subtalar joint bilaterally 2. Painful onychomycotic nails 1 through 5 bilaterally. 3. Pain  bilaterally. 4.  Diabetes mellitus type 2 with PVD  Plan: -New patient visit level 3 for evaluation and management -Discussed onychomycotic nails.  Recommended periodic debridement of the nails every 3 months.  Discussed on the hallux nail on the right.  Told him if this continues to be a problem we may consider matrixectomy of the nail.  Will follow-up with this and see how it goes.  Also discussed the arthralgia around the subtalar  joint.  Recommended good supportive shoes.  Discussed with him precautions he needs to take his diabetic regarding his feet. - Debrided onychomycotic nails 1 through 5 bilaterally.  Return 3 months

## 2023-10-01 ENCOUNTER — Other Ambulatory Visit: Payer: Self-pay

## 2023-10-01 ENCOUNTER — Ambulatory Visit (HOSPITAL_COMMUNITY): Payer: Self-pay | Admitting: Cardiology

## 2023-10-01 ENCOUNTER — Ambulatory Visit (HOSPITAL_COMMUNITY)
Admission: RE | Admit: 2023-10-01 | Discharge: 2023-10-01 | Disposition: A | Discharge status: Home / Self Care | Source: Ambulatory Visit | Attending: Cardiology | Admitting: Cardiology

## 2023-10-01 VITALS — BP 130/90 | HR 100 | Wt 293.0 lb

## 2023-10-01 DIAGNOSIS — Z7984 Long term (current) use of oral hypoglycemic drugs: Secondary | ICD-10-CM | POA: Diagnosis not present

## 2023-10-01 DIAGNOSIS — I130 Hypertensive heart and chronic kidney disease with heart failure and stage 1 through stage 4 chronic kidney disease, or unspecified chronic kidney disease: Secondary | ICD-10-CM | POA: Insufficient documentation

## 2023-10-01 DIAGNOSIS — Z79899 Other long term (current) drug therapy: Secondary | ICD-10-CM | POA: Diagnosis not present

## 2023-10-01 DIAGNOSIS — I5082 Biventricular heart failure: Secondary | ICD-10-CM | POA: Diagnosis not present

## 2023-10-01 DIAGNOSIS — I5042 Chronic combined systolic (congestive) and diastolic (congestive) heart failure: Secondary | ICD-10-CM

## 2023-10-01 DIAGNOSIS — E1165 Type 2 diabetes mellitus with hyperglycemia: Secondary | ICD-10-CM | POA: Insufficient documentation

## 2023-10-01 DIAGNOSIS — E669 Obesity, unspecified: Secondary | ICD-10-CM | POA: Diagnosis not present

## 2023-10-01 DIAGNOSIS — E1122 Type 2 diabetes mellitus with diabetic chronic kidney disease: Secondary | ICD-10-CM | POA: Diagnosis not present

## 2023-10-01 DIAGNOSIS — Z7985 Long-term (current) use of injectable non-insulin antidiabetic drugs: Secondary | ICD-10-CM | POA: Diagnosis not present

## 2023-10-01 DIAGNOSIS — I5023 Acute on chronic systolic (congestive) heart failure: Secondary | ICD-10-CM

## 2023-10-01 DIAGNOSIS — Z794 Long term (current) use of insulin: Secondary | ICD-10-CM | POA: Diagnosis not present

## 2023-10-01 DIAGNOSIS — Z5982 Transportation insecurity: Secondary | ICD-10-CM | POA: Diagnosis not present

## 2023-10-01 DIAGNOSIS — N1831 Chronic kidney disease, stage 3a: Secondary | ICD-10-CM | POA: Insufficient documentation

## 2023-10-01 DIAGNOSIS — Z8249 Family history of ischemic heart disease and other diseases of the circulatory system: Secondary | ICD-10-CM | POA: Diagnosis not present

## 2023-10-01 DIAGNOSIS — Z5986 Financial insecurity: Secondary | ICD-10-CM | POA: Diagnosis not present

## 2023-10-01 DIAGNOSIS — I5022 Chronic systolic (congestive) heart failure: Secondary | ICD-10-CM | POA: Insufficient documentation

## 2023-10-01 LAB — BASIC METABOLIC PANEL WITH GFR
Anion gap: 13 (ref 5–15)
BUN: 20 mg/dL (ref 6–20)
CO2: 25 mmol/L (ref 22–32)
Calcium: 8.7 mg/dL — ABNORMAL LOW (ref 8.9–10.3)
Chloride: 103 mmol/L (ref 98–111)
Creatinine, Ser: 1.52 mg/dL — ABNORMAL HIGH (ref 0.61–1.24)
GFR, Estimated: 55 mL/min — ABNORMAL LOW (ref >60)
Glucose, Bld: 108 mg/dL — ABNORMAL HIGH (ref 70–99)
Potassium: 3.9 mmol/L (ref 3.5–5.1)
Sodium: 141 mmol/L (ref 135–145)

## 2023-10-01 LAB — BRAIN NATRIURETIC PEPTIDE: B Natriuretic Peptide: 27.1 pg/mL (ref 0.0–100.0)

## 2023-10-01 MED ORDER — HYDRALAZINE HCL 50 MG PO TABS
25.0000 mg | ORAL_TABLET | Freq: Three times a day (TID) | ORAL | 1 refills | Status: DC
  Filled 2023-10-01: qty 90, 60d supply, fill #0

## 2023-10-01 NOTE — Patient Instructions (Addendum)
 Good to see you today!  INCREASE hydralazine  to 50 mg Three times a day  Please follow up with our heart failure pharmacist   Labs done today, your results will be available in MyChart, we will contact you for abnormal readings.  Your physician recommends that you schedule a follow-up appointment October with echocardiogram  If you have any questions or concerns before your next appointment please send us  a message through [facility] or call our office at [phone].    TO LEAVE A MESSAGE FOR THE NURSE SELECT OPTION 2, PLEASE LEAVE A MESSAGE INCLUDING: YOUR NAME DATE OF BIRTH CALL BACK NUMBER REASON FOR CALL**this is important as we prioritize the call backs  YOU WILL RECEIVE A CALL BACK THE SAME DAY AS LONG AS YOU CALL BEFORE 4:00 PM At the [facility], you and your health needs are our priority. As part of our continuing mission to provide you with exceptional heart care, we have created designated Provider Care Teams. These Care Teams include your primary Cardiologist (physician) and Advanced Practice Providers (APPs- Physician Assistants and Nurse Practitioners) who all work together to provide you with the care you need, when you need it.   You may see any of the following providers on your designated Care Team at your next follow up: Dr Jules Oar Dr Peder Bourdon Dr. Alwin Baars Dr. Arta Lark Amy Marijane Shoulders, NP Ruddy Corral, [place] [facility] [place], [place] Dennise Fitz, NP [place] Lee, NP Shawnee Dellen, NP Luster Salters, PharmD Bevely Brush, PharmD   Please be sure to bring in all your medications bottles to every appointment.    Thank you for choosing [facility]

## 2023-10-01 NOTE — Progress Notes (Signed)
 ADVANCED HF CLINIC PROGRESS NOTE  Referring Physician: Nieves Bars, NP Primary Care: Joaquin Mulberry, MD HF Cardiologist: Dr. Julane Ny  Reason for Visit. F/u for HFimpEF   HPI:  Terry Young is a 53 y.o. male w/ chronic biventricular systolic heart failure, HTN, poorly controlled DM2, HL and obesity.   Previously followed at [facility]. Diagnosed w/ CHF ~2015 though no records are available during that time. He recalls being diagnosed w/ CHF shortly after a bout w/ a viral illness, which he believes was flu. Echo 2018 w/ EF 40-45%. NST showed no ischemia. CM felt likely NICM from uncontrolled HTN, per notes from [facility]. He has never had a LHC. Uncertain if he has had a cMRI.    Admitted 10/22 a/c CHF. Echo w/ severe biventricular dysfunction. LVEF down to <20%, RV severely reduced w/ global HK, GIIDD. He was diuresed w/ IV Lasix  and placed on GDMT. On Jardiance , Entresto , Spiro and Coreg . Discharge wt 297 lb.  Hgb A1c 14.    He was seen in the HF [facility] in October 2022. Entresto  was increased 97-103 mg bid and lasix  was cut back to 40 mg daily. Follow up in [facility] 1/23 and out of all meds x 2 weeks except Entresto . Meds restarted and referred to paramedicine and to the [facility].   Echo (12/14/21):  EF 55-60% G1DD  He returns today for routine f/u. Denies resting dyspnea. Reports stable NYHA Class II symptoms. He feels he is limited more so by polyneuropathy than dyspnea. He has started Ozempic  and has lost 14 lb thus far. Denies orthopnea/PND. No LEE.   BP elevated 158/104. Reports full med compliance. Checks BP at home and reports SBPs are typically in the 130s.      Cardiac Testing   - Care Everywhere ([facility])--> Echo (2018): EF 40-45% - Echo (01/27/21): EF < 20% Grade II DD RV severely reduced - Echo (12/14/21):  EF 55-60% G1DD  - Care Everywhere ([facility]) Myoview (2018)>> No ischemia      Past Medical History:  Diagnosis Date   CHF (congestive heart failure) (HCC)    Diabetes mellitus without  complication (HCC)    Hypertension    Current Outpatient Medications  Medication Sig Dispense Refill   acetaminophen  (TYLENOL ) 325 MG tablet Take 650 mg by mouth as needed.     aspirin  81 MG EC tablet Take 1 tablet (81 mg total) by mouth daily. 90 tablet 0   atorvastatin  (LIPITOR) 40 MG tablet Take 1 tablet (40 mg total) by mouth daily. 90 tablet 3   carvedilol  (COREG ) 12.5 MG tablet Take 1 tablet (12.5 mg total) by mouth 2 (two) times daily with a meal. NEEDS FOLLOW UP APPOINTMENT FOR MORE REFILLS 180 tablet 0   DULoxetine  (CYMBALTA ) 30 MG capsule Take 1 capsule (30 mg total) by mouth daily. For neuropathy 90 capsule 1   empagliflozin  (JARDIANCE ) 10 MG TABS tablet Take 1 tablet (10 mg total) by mouth daily before breakfast. NEEDS FOLLOW UP APPOINTMENT FOR MORE REFILLS 30 tablet 2   furosemide  (LASIX ) 40 MG tablet Take 1 tablet (40 mg total) by mouth daily. NEEDS FOLLOW UP APPOINTMENT FOR MORE REFILLS 30 tablet 1   gabapentin  (NEURONTIN ) 300 MG capsule Take 1 capsule (300 mg total) by mouth 3 (three) times daily. 270 capsule 1   hydrALAZINE  (APRESOLINE ) 25 MG tablet Take 1 tablet (25 mg total) by mouth 3 (three) times daily. 270 tablet 3   insulin  glargine (LANTUS  SOLOSTAR) 100 UNIT/ML Solostar Pen Inject 10 Units into  the skin at bedtime. 30 mL 6   Insulin  Pen Needle 32G X 4 MM MISC Use to inject insulin  as directed. 100 each 0   isosorbide  mononitrate (IMDUR ) 30 MG 24 hr tablet Take 1 tablet (30 mg total) by mouth daily. 30 tablet 1   metFORMIN  (GLUCOPHAGE ) 1000 MG tablet Take 1 tablet (1,000 mg total) by mouth 2 (two) times daily with a meal. Must have office visit for refills 180 tablet 1   Multiple Vitamins-Minerals (CENTRUM MEN) TABS Take 1 tablet by mouth daily.     sacubitril -valsartan  (ENTRESTO ) 97-103 MG Take 1 tablet by mouth 2 (two) times daily. Please call  office to schedule an appointment 60 tablet 0   Semaglutide , 1 MG/DOSE, 4 MG/3ML SOPN Inject 1 mg as directed once a week. 3 mL  3   Semaglutide ,0.25 or 0.5MG /DOS, (OZEMPIC , 0.25 OR 0.5 MG/DOSE,) 2 MG/3ML SOPN Inject 0.25 mg into the skin once a week, for 4 weeks. After 4 weeks increase to 0.5mg  once a week. 3 mL 0   Semaglutide ,0.25 or 0.5MG /DOS, (OZEMPIC , 0.25 OR 0.5 MG/DOSE,) 2 MG/3ML SOPN Inject 0.5 mg into the skin once a week. For 4 weeks then increase to 1mg  3 mL 0   No current facility-administered medications for this visit.   Allergies  Allergen Reactions   Lisinopril Cough   Social History   Socioeconomic History   Marital status: Single    Spouse name: Not on file   Number of children: Not on file   Years of education: Not on file   Highest education level: Not on file  Occupational History   Not on file  Tobacco Use   Smoking status: Never   Smokeless tobacco: Never  Substance and Sexual Activity   Alcohol use: Never   Drug use: Never   Sexual activity: Not on file  Other Topics Concern   Not on file  Social History Narrative   Not on file   Social Drivers of Health   Financial Resource Strain: High Risk (02/07/2021)   Overall Financial Resource Strain (CARDIA)    Difficulty of Paying Living Expenses: Very hard  Food Insecurity: Food Insecurity Present (06/29/2021)   Hunger Vital Sign    Worried About Running Out of Food in the Last Year: Sometimes true    Ran Out of Food in the Last Year: Never true  Transportation Needs: Unmet Transportation Needs (03/16/2022)   PRAPARE - Administrator, Civil Service (Medical): Yes    Lack of Transportation (Non-Medical): Yes  Physical Activity: Not on file  Stress: Not on file  Social Connections: Not on file  Intimate Partner Violence: Not on file   Family History  Problem Relation Age of Onset   Atrial fibrillation Neg Hx    Family h/o is notable for CHF, CAD and Afib (father).   There were no vitals taken for this visit.  Wt Readings from Last 3 Encounters:  08/16/23 (!) 139.4 kg (307 lb 6.4 oz)  08/03/22 (!) 137.3 kg  (302 lb 9.6 oz)  03/16/22 132.5 kg (292 lb 3.2 oz)   PHYSICAL EXAM: General:  Well appearing, obese. No respiratory difficulty HEENT: normal Neck: supple. 8-9 cm. Carotids 2+ bilat; no bruits. No lymphadenopathy or thyromegaly appreciated. Cor: PMI nondisplaced. Regular rate & rhythm. No rubs, gallops or murmurs. Lungs: clear Abdomen: obese, soft, nontender, nondistended. No hepatosplenomegaly. No bruits or masses. Good bowel sounds. Extremities: no cyanosis, clubbing, rash, edema Neuro: alert & oriented x 3, cranial nerves  grossly intact. moves all 4 extremities w/o difficulty. Affect pleasant.   ECG: NSR 97 bpm  Personally reviewed  ASSESSMENT & PLAN:  1. Chronic Biventricular Systolic Heart Failure  --> EF improved  - presumed NICM, likely hypertensive CM but cannot r/o viral CM  - Echo 2018 ([facility]) EF 40-45%, NST low risk. Has never had LHC nor cMRI  - Echo 10/22 EF <20%, RV severely reduced. - Echo 12/14/21 EF 55-60% G1DD. - NYHA Class II, appears slightly volume overloaded on exam (JVD), though full assessment a bit difficult given body habitus  - will check BNP level. If elevated, will increase Lasix  to 40 mg daily  - Continue Jardiance  10 mg daily  - Continue Entresto  97-103 mg bid. - Off Spironolactone  due to hyperkalemia - Continue Coreg  12.5 mg bid  - Increase hydralazine  50 mg tid for afterload reduction  - Continue Imdur  30 mg daily  - Check BMP and BNP today  - Repeat echo in 4 months (10/25) to ensure stability     2. Hypertension  - moderately elevated - Continue current regimen and increase hydral to 50 tid    3. DM2 - Much improved control, A1c 14. 4 (10/22) -> 7.6 (4/25)  - Continue Jardiance   - on statin, Atorva 40 mg daily    4. Obesity  - There is no height or weight on file to calculate BMI. - now on Ozempic , wt down 14 lb   5. CKD 3a - baseline Scr ~1.5  - continue Jardiance   F/u w/ PharmD in 4 wks for BP check and further med titration. Plan  repeat echo and f/w MD  in 4 months    Ruddy Corral, PA-C  11:43 AM

## 2023-10-02 ENCOUNTER — Other Ambulatory Visit: Payer: Self-pay

## 2023-10-10 ENCOUNTER — Other Ambulatory Visit: Payer: Self-pay | Admitting: Family Medicine

## 2023-10-10 ENCOUNTER — Other Ambulatory Visit (HOSPITAL_COMMUNITY): Payer: Self-pay | Admitting: Internal Medicine

## 2023-10-10 ENCOUNTER — Other Ambulatory Visit: Payer: Self-pay

## 2023-10-10 MED ORDER — SEMAGLUTIDE (1 MG/DOSE) 4 MG/3ML ~~LOC~~ SOPN
1.0000 mg | PEN_INJECTOR | SUBCUTANEOUS | 1 refills | Status: DC
  Filled 2023-10-10 – 2023-10-24 (×2): qty 3, 28d supply, fill #0

## 2023-10-15 ENCOUNTER — Other Ambulatory Visit (HOSPITAL_COMMUNITY): Payer: Self-pay | Admitting: Internal Medicine

## 2023-10-15 ENCOUNTER — Other Ambulatory Visit: Payer: Self-pay

## 2023-10-15 MED ORDER — ISOSORBIDE MONONITRATE ER 30 MG PO TB24
30.0000 mg | ORAL_TABLET | Freq: Every day | ORAL | 1 refills | Status: DC
Start: 2023-10-15 — End: 2023-10-30
  Filled 2023-10-15: qty 30, 30d supply, fill #0

## 2023-10-17 ENCOUNTER — Other Ambulatory Visit: Payer: Self-pay

## 2023-10-17 MED ORDER — ENTRESTO 97-103 MG PO TABS
1.0000 | ORAL_TABLET | Freq: Two times a day (BID) | ORAL | 3 refills | Status: DC
  Filled 2023-10-17: qty 60, 30d supply, fill #0
  Filled 2023-11-12: qty 60, 30d supply, fill #1
  Filled 2023-12-12: qty 60, 30d supply, fill #2
  Filled 2024-01-27: qty 60, 30d supply, fill #3

## 2023-10-18 ENCOUNTER — Other Ambulatory Visit: Payer: Self-pay

## 2023-10-18 ENCOUNTER — Telehealth: Payer: Self-pay | Admitting: Pharmacist

## 2023-10-18 ENCOUNTER — Encounter: Payer: Self-pay | Admitting: Pharmacist

## 2023-10-18 ENCOUNTER — Ambulatory Visit: Attending: Family Medicine | Admitting: Pharmacist

## 2023-10-18 VITALS — BP 129/83 | HR 101

## 2023-10-18 DIAGNOSIS — E1165 Type 2 diabetes mellitus with hyperglycemia: Secondary | ICD-10-CM | POA: Diagnosis not present

## 2023-10-18 DIAGNOSIS — Z7984 Long term (current) use of oral hypoglycemic drugs: Secondary | ICD-10-CM

## 2023-10-18 DIAGNOSIS — Z7985 Long-term (current) use of injectable non-insulin antidiabetic drugs: Secondary | ICD-10-CM | POA: Diagnosis not present

## 2023-10-18 DIAGNOSIS — Z794 Long term (current) use of insulin: Secondary | ICD-10-CM

## 2023-10-18 MED ORDER — SEMAGLUTIDE (2 MG/DOSE) 8 MG/3ML ~~LOC~~ SOPN
2.0000 mg | PEN_INJECTOR | SUBCUTANEOUS | 1 refills | Status: AC
  Filled 2023-10-18 – 2023-11-12 (×2): qty 9, 84d supply, fill #0
  Filled 2023-11-12: qty 9, fill #0
  Filled 2023-11-15: qty 3, 28d supply, fill #0
  Filled 2023-12-10: qty 3, 28d supply, fill #1

## 2023-10-18 MED ORDER — GABAPENTIN 300 MG PO CAPS
600.0000 mg | ORAL_CAPSULE | Freq: Three times a day (TID) | ORAL | 1 refills | Status: DC
  Filled 2023-10-18: qty 540, 90d supply, fill #0

## 2023-10-18 NOTE — Addendum Note (Signed)
 Addended by: Jahaziel Francois on: 10/18/2023 12:23 PM   Modules accepted: Orders

## 2023-10-18 NOTE — Progress Notes (Signed)
 S:     No chief complaint on file.  53 y.o. male who presents for diabetes evaluation, education, and management. Patient arrives in good spirits and presents without any assistance.   Patient was referred and last seen by Primary Care Provider, Dr. Delbert, on 08/16/2023. A1c at that visit was >15%. PMH is significant for HTN, nonischemic cardiomyopathy (dx'd with CHF/HFrEF in 2015, last EF 55-60% on 12/14/2021, and T2DM. He was found to be without medications at his appt with Dr. Newlin in May. We restarted his insulin  + metformin . Ozempic  was added. Patient had been on Jardiance  previously under the care of the heart failure clinic. This was restarted.  Today, patient is doing well since his last visit with Dr. Newlin. Pharmacy saw him 09/17/2023. We had to decrease Lantus  dose due to hypoglycemia. We increased his Ozempic . Since then, hypoglycemia has resolved. Most home glucose levels are staying in the 110-120 range. He is tolerating Ozempic  well. Denies any vision changes, abdominal pain, NV. Of note he is endorsing worsening neuropathy. He had a near fall earlier this week he attributes to bilateral foot numbness causing him to miss a step on the stairs to his home. Per pt gabapentin  has been ineffective and he has been taking it since 2022. He had to stop the duloxetine  prescribed by his PCP a couple of weeks ago due to insomnia and nausea.    Family/Social History:  Fhx: a fib  Tobacco: never smoker  Alcohol: none   Current diabetes medications include: empagliflozin  10 mg daily, Lantus  10 units daily, metformin  1000 mg BID, Ozempic  0.5 mg weekly (has completed 4 injections - plans to increase to the 1 mg dose today)  Patient reports adherence to taking all medications as prescribed.   Insurance coverage: [place] Medicaid  Patient denies hypoglycemic events.  Reported home blood sugars: 119, 120  Patient denies nocturia (nighttime urination).  Patient reports neuropathy (nerve  pain). Patient denies visual changes.  Patient reported dietary habits: Eats 2-3 meals/day -Now back on track with his diet -Compliant with DM diet and salt restriction   Patient-reported exercise habits:  -Reports doing exercises at home but no specific amount of time given  O:   Lab Results  Component Value Date   HGBA1C >15.0 08/16/2023   There were no vitals filed for this visit.  Lipid Panel     Component Value Date/Time   CHOL 212 (H) 08/16/2023 1046   TRIG 278 (H) 08/16/2023 1046   HDL 35 (L) 08/16/2023 1046   LDLCALC 127 (H) 08/16/2023 1046    Clinical Atherosclerotic Cardiovascular Disease (ASCVD): No  The 10-year ASCVD risk score (Arnett DK, et al., 2019) is: 18.5%   Values used to calculate the score:     Age: 27 years     Clincally relevant sex: Male     Is Non-Hispanic African American: Yes     Diabetic: Yes     Tobacco smoker: No     Systolic Blood Pressure: 130 mmHg     Is BP treated: Yes     HDL Cholesterol: 35 mg/dL     Total Cholesterol: 212 mg/dL   Patient is participating in a Managed Medicaid Plan: No    A/P: Diabetes longstanding currently uncontrolled based on A1c, however, he seems to be doing much better. Reported sugars are at goal and hypoglycemia resolved once we decreased his insulin  dose. He is compliant with medications and is tolerating the Ozempic  titration well. Plans to start his  1 mg weekly dose today. We will continue to titrate this up and stop insulin .He is able to verbalize appropriate hypoglycemia management plan.  -Discontinue Lantus . -Increased Ozempic  dose to 1 mg weekly. Rxn sent for Ozempic  2 mg for patient to begin once he completes the 1 mg dose. -Continue metformin  and Jardiance  at current doses.  -Patient educated on purpose, proper use, and potential adverse effects of Lantus , Ozempic .  -Extensively discussed pathophysiology of diabetes, recommended lifestyle interventions, dietary effects on blood sugar control.   -Counseled on s/sx of and management of hypoglycemia.  -Next A1c anticipated 11/2023.   Written patient instructions provided. Patient verbalized understanding of treatment plan.  Total time in face to face counseling 30 minutes.    Follow-up:  Pharmacist in 4-6 weeks.  Herlene Fleeta Morris, PharmD, JAQUELINE, CPP Clinical Pharmacist [facility] [phone]

## 2023-10-18 NOTE — Telephone Encounter (Signed)
 Saw this patient today for diabetes management/Ozempic  titration. I've been helping him titrate Ozempic  over the past couple of months.  Of note, pt with a hx of neuropathy and presents today with worsening symptoms. Endorses numbness in his feet and burning/tingling pain in both legs. Gabapentin  ineffective per patient. It looks like he has been rx'd gabapentin  since 01/31/2021. I confirmed with pharmacy that he has been filing regularly. I asked him if he was also using the duloxetine . This was added 08/16/2023. Unfortunately, he had to stop this due to insomnia and nausea 2 weeks ago.   I'm worried because in addition to his symptoms, he had a near miss earlier this week with a near fall. Attributes this to numbness in his feet causing him to miss a step walking up to his home. Would you have to wait until you see him in August or would you be open to sending a rxn for Lyrica downstairs to our pharmacy? He is willing to try anything at this point, he says. Per patient, has never tried pregabalin before. Thank you for considering.

## 2023-10-18 NOTE — Telephone Encounter (Signed)
 He has been on 300 mg of gabapentin  3 times daily.  I have raised this dose to 600 mg 3 times daily and if symptoms persist on this dose he needs to let me know know we can make a change in therapy.  Thanks.

## 2023-10-24 ENCOUNTER — Other Ambulatory Visit: Payer: Self-pay

## 2023-10-25 ENCOUNTER — Other Ambulatory Visit: Payer: Self-pay

## 2023-10-29 NOTE — Progress Notes (Incomplete)
 ***In Progress***    Advanced Heart Failure Clinic Note  Primary Care: [name], MD HF Cardiologist: Dr. Cherrie  HPI:  Terry Young is a 53 y.o. male w/ chronic biventricular systolic heart failure, HTN, poorly controlled DM2, HL and obesity.   Previously followed at [facility]. Diagnosed w/ CHF ~2015 though no records are available during that time. He recalls being diagnosed w/ CHF shortly after a bout w/ a viral illness, which he believes was flu. Echo 2018 w/ EF 40-45%. NST showed no ischemia. CM felt likely NICM from uncontrolled HTN, per notes from [facility]. He has never had a LHC. Uncertain if he has had a cMRI.    Admitted 01/2021 with acute CHF. Echo w/ severe biventricular dysfunction. LVEF down to <20%, RV severely reduced w/ global HK, grade II diastolic dysfunction. He was diuresed w/ IV Lasix  and placed on GDMT. On Jardiance , Entresto , Spiro and Coreg . Discharge wt 297 lb.  Hgb A1c 14.    He was seen in the HF [facility] in October 2022. Entresto  was increased 97/103 mg BID and furosemide  was decreased to 40 mg daily. Follow up in [facility] 04/2021 and out of all meds x 2 weeks except Entresto . Meds restarted and referred to paramedicine and to the [facility].   ECHO 12/14/21:  EF 55-60% grade I diastolic dysfunction  On 03/06/2022 he had a follow up HF visit with APP. Overall felt fine. Denied SOB/PND/Orthopnea. Appetite was ok. No fever or chills. Weight at home  285-287 pounds. He reported taking all medications but had been out of Jardiance  for 3 weeks. [place] he would have Medicaid starting the next day. He reported that he needed transportation help as he did not drive. Labs at the end of this visit showed elevated K and Scr. Patient was called and advised to stop potassium and spironolactone .   At follow up with MD on 08/03/2022, he was provided sample Jardiance  10 mg. Jardiance  required a PA which was approved later that day.   He presented to CHF clinic on 10/01/2023 for routine follow up.  Denied resting dyspnea. Reported stable NYHA Class II symptoms. He felt he was limited more by polyneuropathy than dyspnea. He had started Ozempic  and lost 14 lbs. Denied orthopnea/PND. No LEE. BP was elevated at 158/104 at start of visit. Reported full med compliance. Reported that he checks BP at home and  SBPs were typically in the 130s.   Today he returns to HF clinic for pharmacist medication titration. At last visit with APP, hydralazine  was increased to 50 mg TID.   Overall feeling ***. Dizziness, lightheadedness, fatigue:  Chest pain or palpitations:  How is your breathing?: *** SOB: Able to complete all ADLs. Activity level ***  Weight at home pounds. Takes furosemide /torsemide/bumex *** mg *** daily.  LEE PND/Orthopnea  Appetite *** Low-salt diet:   Physical Exam Cost/affordability of meds   HF Medications: Carvedilol  12.5 mg BID Entresto  97/103 mg BID Jardiance  10 mg daily Hydralazine  50 mg TID *** - listed as 25 mg TID but was called told to increase at last visit - checkphone call  Isosorbide  mononitrate 30 mg daily  Furosemide  40 mg daily   Has the patient been experiencing any side effects to the medications prescribed?  {YES NO:22349}  Does the patient have any problems obtaining medications due to transportation or finances?   {YES NO:22349}  Understanding of regimen: {excellent/good/fair/poor:19665} Understanding of indications: {excellent/good/fair/poor:19665} Potential of compliance: {excellent/good/fair/poor:19665} Patient understands to avoid NSAIDs. Patient understands to avoid decongestants.  Pertinent Lab Values: 10/01/2023: Serum creatinine 1.52, BUN 20, Potassium 3.9, Sodium 141, BNP 27.1  Vital Signs: Weight: *** lbs (last clinic weight: *** lbs) Blood pressure: *** mmHg Heart rate: *** bpm  Assessment/Plan: 1. Chronic Biventricular Systolic Heart Failure  --> EF improved  - presumed NICM, likely hypertensive CM but cannot r/o viral CM   - Echo 2018 ([facility]) EF 40-45%, NST low risk. Has never had LHC nor cMRI  - Echo 10/22 EF <20%, RV severely reduced. - Echo 12/14/21 EF 55-60% G1DD. - NYHA Class *** - Continue carvedilol  12.5 mg BID  - Continue Entresto  97-103 mg BID. - Off Spironolactone  due to hyperkalemia - Continue Jardiance  10 mg daily  - Continue hydralazine  50 mg TID - Continue isosorbide  mononitrate 30 mg daily  - Repeat ECHO scheduled for 03/2024  2. Hypertension  - BP *** - Continue carvedilol  12.5 mg BID  - Continue Entresto  97/103 mg BID - Continue hydralazine  50 TID - Continue isosorbide  mononitrate 30 mg daily   3. DM2 - Much improved control, A1c 14. 4 (10/22) -> 7.6 (4/25)  - Continue Jardiance  10 mg daily - Continue atorvastatin  40 mg daily    4. Obesity  - Continue Ozempic  ***   5. CKD 3a - baseline Scr ~1.5  - continue Jardiance  10 mg daily  Follow up with MD in 3 months. Plan to repeat ECHO then.    Tinnie Redman, PharmD, BCPS, BCCP, CPP Heart Failure Clinic Pharmacist [phone]

## 2023-10-30 ENCOUNTER — Other Ambulatory Visit: Payer: Self-pay

## 2023-10-30 ENCOUNTER — Ambulatory Visit (HOSPITAL_COMMUNITY)
Admission: RE | Admit: 2023-10-30 | Discharge: 2023-10-30 | Disposition: A | Discharge status: Home / Self Care | Source: Ambulatory Visit | Attending: Cardiology | Admitting: Cardiology

## 2023-10-30 DIAGNOSIS — I5043 Acute on chronic combined systolic (congestive) and diastolic (congestive) heart failure: Secondary | ICD-10-CM

## 2023-10-30 DIAGNOSIS — Z7984 Long term (current) use of oral hypoglycemic drugs: Secondary | ICD-10-CM | POA: Diagnosis not present

## 2023-10-30 DIAGNOSIS — E669 Obesity, unspecified: Secondary | ICD-10-CM | POA: Diagnosis not present

## 2023-10-30 DIAGNOSIS — Z7985 Long-term (current) use of injectable non-insulin antidiabetic drugs: Secondary | ICD-10-CM | POA: Insufficient documentation

## 2023-10-30 DIAGNOSIS — N1831 Chronic kidney disease, stage 3a: Secondary | ICD-10-CM | POA: Diagnosis not present

## 2023-10-30 DIAGNOSIS — E1122 Type 2 diabetes mellitus with diabetic chronic kidney disease: Secondary | ICD-10-CM | POA: Insufficient documentation

## 2023-10-30 DIAGNOSIS — E1140 Type 2 diabetes mellitus with diabetic neuropathy, unspecified: Secondary | ICD-10-CM | POA: Diagnosis not present

## 2023-10-30 DIAGNOSIS — E785 Hyperlipidemia, unspecified: Secondary | ICD-10-CM | POA: Insufficient documentation

## 2023-10-30 DIAGNOSIS — I5082 Biventricular heart failure: Secondary | ICD-10-CM | POA: Insufficient documentation

## 2023-10-30 DIAGNOSIS — Z79899 Other long term (current) drug therapy: Secondary | ICD-10-CM | POA: Insufficient documentation

## 2023-10-30 DIAGNOSIS — E1165 Type 2 diabetes mellitus with hyperglycemia: Secondary | ICD-10-CM | POA: Diagnosis not present

## 2023-10-30 DIAGNOSIS — I5022 Chronic systolic (congestive) heart failure: Secondary | ICD-10-CM | POA: Insufficient documentation

## 2023-10-30 DIAGNOSIS — I130 Hypertensive heart and chronic kidney disease with heart failure and stage 1 through stage 4 chronic kidney disease, or unspecified chronic kidney disease: Secondary | ICD-10-CM | POA: Diagnosis not present

## 2023-10-30 MED ORDER — EMPAGLIFLOZIN 10 MG PO TABS
10.0000 mg | ORAL_TABLET | Freq: Every day | ORAL | 3 refills | Status: AC
  Filled 2023-10-30: qty 90, 90d supply, fill #0
  Filled 2024-01-27: qty 90, 90d supply, fill #1

## 2023-10-30 MED ORDER — HYDRALAZINE HCL 25 MG PO TABS
25.0000 mg | ORAL_TABLET | Freq: Three times a day (TID) | ORAL | 3 refills | Status: DC
  Filled 2023-10-30 – 2024-01-27 (×2): qty 90, 30d supply, fill #0

## 2023-10-30 MED ORDER — ATORVASTATIN CALCIUM 40 MG PO TABS
40.0000 mg | ORAL_TABLET | Freq: Every day | ORAL | 3 refills | Status: AC
  Filled 2023-10-30 – 2023-12-10 (×2): qty 90, 90d supply, fill #0
  Filled 2024-04-23: qty 90, 90d supply, fill #1
  Filled 2024-05-15: qty 90, 90d supply, fill #2

## 2023-10-30 MED ORDER — FUROSEMIDE 40 MG PO TABS
40.0000 mg | ORAL_TABLET | Freq: Every day | ORAL | 3 refills | Status: DC
  Filled 2023-10-30 – 2023-11-12 (×2): qty 90, 90d supply, fill #0
  Filled 2024-03-17: qty 90, 90d supply, fill #1

## 2023-10-30 MED ORDER — CARVEDILOL 12.5 MG PO TABS
12.5000 mg | ORAL_TABLET | Freq: Two times a day (BID) | ORAL | 3 refills | Status: AC
  Filled 2023-10-30 – 2023-12-10 (×2): qty 180, 90d supply, fill #0
  Filled 2024-04-23: qty 180, 90d supply, fill #1

## 2023-10-30 MED ORDER — ISOSORBIDE MONONITRATE ER 60 MG PO TB24
60.0000 mg | ORAL_TABLET | Freq: Every day | ORAL | 3 refills | Status: AC
  Filled 2023-10-30: qty 90, 90d supply, fill #0
  Filled 2024-03-17: qty 90, 90d supply, fill #1

## 2023-10-30 NOTE — Patient Instructions (Signed)
 It was a pleasure seeing you today!  MEDICATIONS: -We are changing your medications today -Take Lasix  60 mg (1.5 tablets) for 2 days then resume 40 mg (1 tablet) daily  -Decrease hydralazine  back to 25 mg (1 tablet) three times daily. You can take 1/2 tablet of the 50 mg tablet three times daily to use up old stock if you would like.  - Increase isosorbide  mononitrate to 60 mg (1 tablet) daily.  -Call if you have questions about your medications.   NEXT APPOINTMENT: Return to clinic in 3 months with Dr. Bensimhon. Please call the clinic near the end of September if they have not called to schedule you.  In general, to take care of your heart failure: -Limit your fluid intake to 2 Liters (half-gallon) per day.   -Limit your salt intake to ideally 2-3 grams (2000-3000 mg) per day. -Weigh yourself daily and record, and bring that weight diary to your next appointment.  (Weight gain of 2-3 pounds in 1 day typically means fluid weight.) -The medications for your heart are to help your heart and help you live longer.   -Please contact us  before stopping any of your heart medications.  Call the clinic at [phone] with questions or to reschedule future appointments.

## 2023-10-30 NOTE — Progress Notes (Signed)
 Advanced Heart Failure Clinic Note   Primary Care: [name], MD HF Cardiologist: Dr. Cherrie  HPI:  Terry Young is a 53 y.o. male w/ chronic biventricular systolic heart failure, HTN, poorly controlled DM2, HL and obesity.   Previously followed at [facility]. Diagnosed w/ CHF ~2015 though no records are available during that time. He recalls being diagnosed w/ CHF shortly after a bout w/ a viral illness, which he believes was flu. Echo 2018 w/ EF 40-45%. NST showed no ischemia. CM felt likely NICM from uncontrolled HTN, per notes from [facility]. He has never had a LHC. Uncertain if he has had a cMRI.    Admitted 01/2021 with acute CHF. Echo w/ severe biventricular dysfunction. LVEF down to <20%, RV severely reduced w/ global HK, grade II diastolic dysfunction. He was diuresed w/ IV Lasix  and placed on GDMT. On Jardiance , Entresto , spironolactone  and carvedilol . Discharge wt 297 lb.  Hgb A1c 14.    He was seen in the HF TOC in October 2022. Entresto  was increased 97/103 mg BID and furosemide  was decreased to 40 mg daily. Follow up in [facility] 04/2021 and out of all meds x 2 weeks except Entresto . Meds restarted and referred to paramedicine and to the [facility].   ECHO 12/14/21:  EF 55-60%, grade I diastolic dysfunction  On 03/06/2022 he had a follow up HF visit with APP. Overall felt fine. Denied SOB/PND/Orthopnea. Appetite was ok. No fever or chills. Weight at home  285-287 pounds. He reported taking all medications but had been out of Jardiance  for 3 weeks. [place] he would have Medicaid starting the next day. He reported that he needed transportation help as he did not drive. Labs at the end of this visit showed elevated K and Scr. Patient was called and advised to stop potassium and spironolactone .    He presented to CHF clinic on 10/01/2023 for routine follow up. Denied resting dyspnea. Reported stable NYHA Class II symptoms. He felt he was limited more by polyneuropathy than dyspnea. He had started  Ozempic  and lost 14 lbs. Denied orthopnea/PND. No LEE. BP was elevated at 158/104 at start of visit. Reported full med compliance. Reported that he checks BP at home and SBPs were typically in the 130s.   Today he returns to HF clinic for pharmacist medication titration. At last visit with APP, hydralazine  was increased to 50 mg TID. He says that overall he has been feeling good. He mentions that his neuropathy has been driving most of any discomfort that he has. Endorses dizziness after taking morning and mid-day doses of hydralazine . These episodes typically last for 60-90 minutes, but have previously lasted up to 120 minutes. He does not check BP during this time. He is careful not to drive during these episodes. Denies CP or palpitations. He says his breathing has been good. Denies SOB. He does try to walk for exercise but this is limited by neuropathy, not breathing/fatigue. Weight at home typically 285-290 lbs, has been losing weight with Ozempic . He says he feels like he is sometimes holding on to fluid, but has not had to take more than his daily furosemide  40 mg. Trace edema around the ankles on exam today but otherwise euvolemic on exam. BNP on 10/01/2023 was 27.1. Denies PND/orthopnea. His appetite has decreased since he has started Ozempic . He does avoid using salt when cooking. He mentioned that in the past he has had to wait to pick up medications until he can afford them, causing him to  miss some doses. Patient does have [place] Medicaid, but with quantity of prescriptions, total co-pays can be prohibitive. Time was taken to educate him on the A/R payment style (copays can be waived and placed on charge account) at [facility], and he verbalized understanding.   HF Medications: Carvedilol  12.5 mg BID Entresto  97/103 mg BID Jardiance  10 mg daily Hydralazine  50 mg TID  Isosorbide  mononitrate 30 mg daily  Furosemide  40 mg daily   Has the patient been experiencing any side  effects to the medications prescribed?  Yes. Endorses dizziness after taking morning and mid-day dose of hydralazine  50 mg.  Does the patient have any problems obtaining medications due to transportation or finances?   Yes. Has [place] Medicaid Prepaid Health Plan through [employer]. He mentioned he has missed some doses of medications in the past due to cost being prohibitive Spoke with patient about A/R payment style at [facility] and he verbalized understanding.   Understanding of regimen: excellent Understanding of indications: excellent Potential of compliance: good Patient understands to avoid NSAIDs. Patient understands to avoid decongestants.    Pertinent Lab Values: 10/01/2023: Serum creatinine 1.52, BUN 20, Potassium 3.9, Sodium 141, BNP 27.1  Vital Signs: Weight: 291.6 lbs (last clinic weight: 293 lbs) Blood pressure: 122/76 mmHg Heart rate: 105 bpm  Assessment/Plan: 1. Chronic Biventricular Systolic Heart Failure  - presumed NICM, likely hypertensive CM but cannot r/o viral CM  - Echo 2018 ([facility]) EF 40-45%, NST low risk. Has never had LHC nor cMRI  - Echo 10/22 EF <20%, RV severely reduced. - Echo 12/14/21 EF 55-60% G1DD. - NYHA Class II. Trace bilateral LEE, but weight is down 1 lb from last visit and BNP at that visit was 27.1.  - Take furosemide  60 mg x 2 days, then continue furosemide  40 mg daily thereafter. Asked patient to document if taking the extra furosemide  helped his trace edema.  - Continue carvedilol  12.5 mg BID. Will not increase today given patient believes he is carrying more fluid than his baseline.  - Continue Entresto  97/103 mg BID. - Off spironolactone  due to hyperkalemia. - Continue Jardiance  10 mg daily.  - Decrease hydralazine  back to 25 mg TID due to significant  dizziness.  - Increase isosorbide  mononitrate to 60 mg daily.   2. Hypertension  - BP 122/76 mmHg today  - Continue carvedilol  12.5 mg BID  - Continue Entresto  97/103 mg  BID - Decrease hydralazine  to 25 mg TID - Increase isosorbide  mononitrate to 60 mg daily   3. DM2 - A1c > 15 (08/16/2023) - Continue Jardiance  10 mg daily - Continue atorvastatin  40 mg daily  - Continue Ozempic   4. Obesity  - Continue Ozempic    5. CKD 3a - Baseline Scr ~1.5  - Continue Jardiance  10 mg daily  Follow up with MD in 3 months with echo.  Tinnie Redman, PharmD, BCPS, BCCP, CPP Heart Failure Clinic Pharmacist [phone]

## 2023-10-31 ENCOUNTER — Other Ambulatory Visit: Payer: Self-pay

## 2023-11-02 ENCOUNTER — Other Ambulatory Visit: Payer: Self-pay

## 2023-11-12 ENCOUNTER — Other Ambulatory Visit: Payer: Self-pay

## 2023-11-15 ENCOUNTER — Other Ambulatory Visit: Payer: Self-pay

## 2023-11-16 ENCOUNTER — Telehealth: Payer: Self-pay | Admitting: Family Medicine

## 2023-11-16 NOTE — Telephone Encounter (Signed)
 Called patient, no answer. Left voicemail confirming upcoming appointment on 11/19/2023 at 10;50 am. Provided callback number for any questions or changes.

## 2023-11-19 ENCOUNTER — Other Ambulatory Visit: Payer: Self-pay

## 2023-11-19 ENCOUNTER — Ambulatory Visit: Attending: Family Medicine | Admitting: Family Medicine

## 2023-11-19 ENCOUNTER — Encounter: Payer: Self-pay | Admitting: Family Medicine

## 2023-11-19 VITALS — BP 145/96 | HR 103 | Ht 70.0 in | Wt 284.0 lb

## 2023-11-19 DIAGNOSIS — E1169 Type 2 diabetes mellitus with other specified complication: Secondary | ICD-10-CM

## 2023-11-19 DIAGNOSIS — Z7984 Long term (current) use of oral hypoglycemic drugs: Secondary | ICD-10-CM

## 2023-11-19 DIAGNOSIS — E785 Hyperlipidemia, unspecified: Secondary | ICD-10-CM | POA: Diagnosis not present

## 2023-11-19 DIAGNOSIS — E66813 Obesity, class 3: Secondary | ICD-10-CM

## 2023-11-19 DIAGNOSIS — I152 Hypertension secondary to endocrine disorders: Secondary | ICD-10-CM | POA: Diagnosis not present

## 2023-11-19 DIAGNOSIS — Z7985 Long-term (current) use of injectable non-insulin antidiabetic drugs: Secondary | ICD-10-CM

## 2023-11-19 DIAGNOSIS — Z6841 Body Mass Index (BMI) 40.0 and over, adult: Secondary | ICD-10-CM

## 2023-11-19 DIAGNOSIS — I130 Hypertensive heart and chronic kidney disease with heart failure and stage 1 through stage 4 chronic kidney disease, or unspecified chronic kidney disease: Secondary | ICD-10-CM

## 2023-11-19 DIAGNOSIS — N183 Chronic kidney disease, stage 3 unspecified: Secondary | ICD-10-CM

## 2023-11-19 DIAGNOSIS — E1122 Type 2 diabetes mellitus with diabetic chronic kidney disease: Secondary | ICD-10-CM | POA: Diagnosis not present

## 2023-11-19 DIAGNOSIS — E6609 Other obesity due to excess calories: Secondary | ICD-10-CM

## 2023-11-19 DIAGNOSIS — E1142 Type 2 diabetes mellitus with diabetic polyneuropathy: Secondary | ICD-10-CM | POA: Diagnosis not present

## 2023-11-19 LAB — POCT GLYCOSYLATED HEMOGLOBIN (HGB A1C): HbA1c, POC (controlled diabetic range): 6.8 % (ref 0.0–7.0)

## 2023-11-19 MED ORDER — DULOXETINE HCL 60 MG PO CPEP
60.0000 mg | ORAL_CAPSULE | Freq: Every day | ORAL | 3 refills | Status: DC
  Filled 2023-11-19: qty 30, 30d supply, fill #0

## 2023-11-19 NOTE — Patient Instructions (Signed)
 VISIT SUMMARY:  Today, we reviewed your ongoing health conditions, including diabetes, hypertension, heart failure, and chronic kidney disease. We discussed your recent medication changes and your current symptoms, particularly focusing on your blood sugar levels, neuropathy, and cholesterol levels.  YOUR PLAN:  -TYPE 2 DIABETES MELLITUS WITH DIABETIC POLYNEUROPATHY: Your diabetes management has improved, with your A1c now at 6.8%. However, you continue to experience neuropathy, which causes pain and numbness in your hands and feet. We will continue your current diabetes medications, including Ozempic  2 mg weekly, Jardiance , and metformin . We will discontinue the 1 mg dose of Ozempic  and add Cymbalta  to help manage your neuropathy symptoms. Please continue with your lifestyle modifications, such as diet and exercise.  -OBESITY: We will continue to manage your weight through lifestyle modifications and improved diabetes control. Maintaining a healthy weight is important for your overall health.  -HYPERTENSIVE HEART DISEASE WITH COMBINED SYSTOLIC AND DIASTOLIC HEART FAILURE: Your heart failure symptoms are well-controlled with your current medications, which include furosemide , Entresto , hydralazine , and a beta blocker. Please continue taking these medications as prescribed. You have a follow-up appointment with cardiology in October.  -HYPERTENSION IN CHRONIC KIDNEY DISEASE STAGE 3 SECONDARY TO TYPE 2 DIABETES MELLITUS: Your blood pressure is generally well-controlled, though it was slightly elevated today. Your chronic kidney disease is likely due to your hypertension and diabetes. We will continue to monitor your blood pressure and kidney function.  -HYPERLIPIDEMIA ASSOCIATED WITH TYPE 2 DIABETES MELLITUS: Your cholesterol levels were elevated in May, with an LDL of 127. Since you have a history of heart disease, our target LDL is less than 100. We will recheck your cholesterol levels and continue  your atorvastatin  medication.  INSTRUCTIONS:  Please follow up with cardiology in October for your heart condition. We will also recheck your cholesterol levels at your next visit. Continue monitoring your blood pressure and blood sugar levels at home, and maintain your lifestyle modifications.

## 2023-11-19 NOTE — Progress Notes (Signed)
 Subjective:  Patient ID: Terry Young, male    DOB: 1970/05/31  Age: 53 y.o. MRN: [id number]  CC: Medical Management of Chronic Issues (Neuropathy in feet and hands)     Discussed the use of AI scribe software for clinical note transcription with the patient, who gave verbal consent to proceed.  History of Present Illness Terry Young is a 53 year old male with a history of HFrEF (EF 55 to 60%, mild LVH from echo of 11/2021), morbid obesity, type 2 diabetes mellitus  who presents for follow-up.  He recently discontinued insulin  due to hypoglycemia and started Ozempic  2 mg last Thursday. He is also on Jardiance  and metformin . His blood sugar levels are stable, not exceeding 130 mg/dL, and his J8r has improved to 6.8% from greater than 50  He experiences neuropathy in his hands and feet, with shooting pains and numbness affecting his balance and exercise ability. Doubling his gabapentin  dose has not significantly improved symptoms but symptoms are still present.  His blood pressure is typically around 130/80 mmHg. He has stage 3 chronic kidney disease, monitored due to hypertension and diabetes.  Today his blood pressure is elevated and he states he just took his antihypertensive not long before his appointment.  His LDL cholesterol was 127 mg/dL in May, and it will be rechecked. He has a history of heart disease but no recent shortness of breath or ankle swelling. He is on furosemide , Entresto , hydralazine , and a beta blocker for heart failure and follows up regularly with the cardiology clinic.    Past Medical History:  Diagnosis Date   CHF (congestive heart failure) (HCC)    Diabetes mellitus without complication (HCC)    Hypertension     No past surgical history on file.  Family History  Problem Relation Age of Onset   Atrial fibrillation Neg Hx     Social History   Socioeconomic History   Marital status: Single    Spouse name: Not on file   Number of children: Not on file    Years of education: Not on file   Highest education level: Not on file  Occupational History   Not on file  Tobacco Use   Smoking status: Never   Smokeless tobacco: Never  Substance and Sexual Activity   Alcohol use: Never   Drug use: Never   Sexual activity: Not on file  Other Topics Concern   Not on file  Social History Narrative   Not on file   Social Drivers of Health   Financial Resource Strain: High Risk (02/07/2021)   Overall Financial Resource Strain (CARDIA)    Difficulty of Paying Living Expenses: Very hard  Food Insecurity: Food Insecurity Present (06/29/2021)   Hunger Vital Sign    Worried About Running Out of Food in the Last Year: Sometimes true    Ran Out of Food in the Last Year: Never true  Transportation Needs: Unmet Transportation Needs (03/16/2022)   PRAPARE - Administrator, Civil Service (Medical): Yes    Lack of Transportation (Non-Medical): Yes  Physical Activity: Not on file  Stress: Not on file  Social Connections: Not on file    Allergies  Allergen Reactions   Lisinopril Cough    Outpatient Medications Prior to Visit  Medication Sig Dispense Refill   acetaminophen  (TYLENOL ) 325 MG tablet Take 650 mg by mouth as needed.     aspirin  81 MG EC tablet Take 1 tablet (81 mg total) by mouth daily. 90 tablet  0   atorvastatin  (LIPITOR) 40 MG tablet Take 1 tablet (40 mg total) by mouth daily. 90 tablet 3   carvedilol  (COREG ) 12.5 MG tablet Take 1 tablet (12.5 mg total) by mouth 2 (two) times daily with a meal. 180 tablet 3   empagliflozin  (JARDIANCE ) 10 MG TABS tablet Take 1 tablet (10 mg total) by mouth daily before breakfast. 90 tablet 3   furosemide  (LASIX ) 40 MG tablet Take 1 tablet (40 mg total) by mouth daily. 90 tablet 3   gabapentin  (NEURONTIN ) 300 MG capsule Take 2 capsules (600 mg total) by mouth 3 (three) times daily. 540 capsule 1   hydrALAZINE  (APRESOLINE ) 25 MG tablet Take 1 tablet (25 mg total) by mouth 3 (three) times daily. 90  tablet 3   Insulin  Pen Needle 32G X 4 MM MISC Use to inject insulin  as directed. 100 each 0   isosorbide  mononitrate (IMDUR ) 60 MG 24 hr tablet Take 1 tablet (60 mg total) by mouth daily. 90 tablet 3   metFORMIN  (GLUCOPHAGE ) 1000 MG tablet Take 1 tablet (1,000 mg total) by mouth 2 (two) times daily with a meal. Must have office visit for refills 180 tablet 1   Multiple Vitamins-Minerals (CENTRUM MEN) TABS Take 1 tablet by mouth daily.     sacubitril -valsartan  (ENTRESTO ) 97-103 MG Take 1 tablet by mouth 2 (two) times daily. Please call  office to schedule an appointment 60 tablet 3   Semaglutide , 2 MG/DOSE, 8 MG/3ML SOPN Inject 2 mg as directed once a week. 9 mL 1   Semaglutide , 1 MG/DOSE, 4 MG/3ML SOPN Inject 1 mg as directed once a week. 3 mL 1   No facility-administered medications prior to visit.     ROS Review of Systems  Constitutional:  Negative for activity change and appetite change.  HENT:  Negative for sinus pressure and sore throat.   Respiratory:  Negative for chest tightness, shortness of breath and wheezing.   Cardiovascular:  Negative for chest pain and palpitations.  Gastrointestinal:  Negative for abdominal distention, abdominal pain and constipation.  Genitourinary: Negative.   Musculoskeletal: Negative.   Neurological:  Positive for numbness.  Psychiatric/Behavioral:  Negative for behavioral problems and dysphoric mood.     Objective:  BP (!) 145/96   Pulse (!) 103   Ht 5' 10 (1.778 m)   Wt 284 lb (128.8 kg)   SpO2 99%   BMI 40.75 kg/m      11/19/2023   11:36 AM 11/19/2023   10:56 AM 10/30/2023    1:43 PM  BP/Weight  Systolic BP 145 150 122  Diastolic BP 96 96 76  Wt. (Lbs)  284 291.6  BMI  40.75 kg/m2 41.84 kg/m2    Wt Readings from Last 3 Encounters:  11/19/23 284 lb (128.8 kg)  10/30/23 291 lb 9.6 oz (132.3 kg)  10/01/23 293 lb (132.9 kg)     Physical Exam Constitutional:      Appearance: He is well-developed.  Cardiovascular:     Rate and  Rhythm: Tachycardia present.     Heart sounds: Normal heart sounds. No murmur heard. Pulmonary:     Effort: Pulmonary effort is normal.     Breath sounds: Normal breath sounds. No wheezing or rales.  Chest:     Chest wall: No tenderness.  Abdominal:     General: Bowel sounds are normal. There is no distension.     Palpations: Abdomen is soft. There is no mass.     Tenderness: There is no abdominal tenderness.  Musculoskeletal:        General: Normal range of motion.     Right lower leg: No edema.     Left lower leg: No edema.  Neurological:     Mental Status: He is alert and oriented to person, place, and time.  Psychiatric:        Mood and Affect: Mood normal.        Latest Ref Rng & Units 10/01/2023   12:53 PM 08/16/2023   10:46 AM 08/03/2022    2:19 PM  CMP  Glucose 70 - 99 mg/dL 891  680  816   BUN 6 - 20 mg/dL 20  18  19    Creatinine 0.61 - 1.24 mg/dL 8.47  8.48  8.54   Sodium 135 - 145 mmol/L 141  136  138   Potassium 3.5 - 5.1 mmol/L 3.9  4.4  3.8   Chloride 98 - 111 mmol/L 103  99  103   CO2 22 - 32 mmol/L 25  18  25    Calcium  8.9 - 10.3 mg/dL 8.7  9.3  8.6   Total Protein 6.0 - 8.5 g/dL  7.5    Total Bilirubin 0.0 - 1.2 mg/dL  0.7    Alkaline Phos 44 - 121 IU/L  112    AST 0 - 40 IU/L  12    ALT 0 - 44 IU/L  11      Lipid Panel     Component Value Date/Time   CHOL 212 (H) 08/16/2023 1046   TRIG 278 (H) 08/16/2023 1046   HDL 35 (L) 08/16/2023 1046   LDLCALC 127 (H) 08/16/2023 1046    CBC    Component Value Date/Time   WBC 9.0 01/27/2021 0153   RBC 5.53 01/27/2021 0153   HGB 16.4 01/27/2021 0153   HCT 49.8 01/27/2021 0153   PLT 215 01/27/2021 0153   MCV 90.1 01/27/2021 0153   MCH 29.7 01/27/2021 0153   MCHC 32.9 01/27/2021 0153   RDW 13.2 01/27/2021 0153   LYMPHSABS 1.5 01/26/2021 1655   MONOABS 0.6 01/26/2021 1655   EOSABS 0.1 01/26/2021 1655   BASOSABS 0.1 01/26/2021 1655    Lab Results  Component Value Date   HGBA1C 6.8 11/19/2023        Assessment & Plan Type 2 diabetes mellitus with diabetic polyneuropathy Diabetes management improved with A1c at 6.8. Neuropathy symptoms persist; gabapentin  provides partial relief. Added Cymbalta  for neuropathy - Continue Ozempic  2 mg weekly. - Continue Jardiance  and metformin . - Discontinue Ozempic  1 mg. - Add Cymbalta  for neuropathy. - Encourage continued lifestyle modifications.  Obesity Ongoing management with lifestyle modifications and improved diabetes control. 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 Current medications include furosemide , Entresto , hydralazine , and beta blocker. Cardiology follow-up in October. - Continue current heart failure medications. - Follow up with cardiology in October.  Hypertension in Chronic kidney disease stage 3 secondary to type 2 Diabetes Mellitus Blood pressure generally well-controlled, slightly elevated today. CKD stage 3 likely due to hypertension and diabetes. No nephrology referral; monitoring kidney function. - Monitor blood pressure; no antihypertensive regimen changes given blood pressures have been well-controlled in the past and his current elevation is due to the fact that he took his antihypertensive not long ago  Hyperlipidemia associated with  Type 2 Diabetes Mellitus Cholesterol levels elevated in May with LDL at 127. Target LDL <100 due to heart disease risk. Recheck cholesterol levels. - Check cholesterol levels again. - Continue atorvastatin .    Meds ordered this encounter  Medications   DULoxetine  (CYMBALTA ) 60 MG capsule    Sig: Take 1 capsule (60 mg total) by mouth daily. For diabetic neuropathy    Dispense:  30 capsule    Refill:  3    Follow-up: Return in about 6 months (around 05/21/2024) for  Chronic medical conditions.       Corrina Sabin, MD, FAAFP. [facility] [place], [place] [phone]   11/19/2023, 12:33 PM

## 2023-11-20 ENCOUNTER — Ambulatory Visit: Payer: Self-pay | Admitting: Family Medicine

## 2023-11-20 LAB — LP+NON-HDL CHOLESTEROL
Cholesterol, Total: 124 mg/dL (ref 100–199)
HDL: 32 mg/dL — ABNORMAL LOW (ref >39)
LDL Chol Calc (NIH): 69 mg/dL (ref 0–99)
Total Non-HDL-Chol (LDL+VLDL): 92 mg/dL (ref 0–129)
Triglycerides: 128 mg/dL (ref 0–149)
VLDL Cholesterol Cal: 23 mg/dL (ref 5–40)

## 2023-11-27 ENCOUNTER — Encounter: Payer: Self-pay | Admitting: Family Medicine

## 2023-11-28 ENCOUNTER — Other Ambulatory Visit: Payer: Self-pay

## 2023-11-28 ENCOUNTER — Other Ambulatory Visit: Payer: Self-pay | Admitting: Family Medicine

## 2023-11-28 MED ORDER — PREGABALIN 50 MG PO CAPS
50.0000 mg | ORAL_CAPSULE | Freq: Two times a day (BID) | ORAL | 1 refills | Status: DC
  Filled 2023-11-28 (×2): qty 60, 30d supply, fill #0
  Filled 2024-01-27: qty 60, 30d supply, fill #1

## 2023-12-11 ENCOUNTER — Other Ambulatory Visit: Payer: Self-pay

## 2023-12-13 ENCOUNTER — Other Ambulatory Visit: Payer: Self-pay

## 2023-12-21 ENCOUNTER — Ambulatory Visit: Admitting: Podiatry

## 2023-12-30 ENCOUNTER — Emergency Department (HOSPITAL_BASED_OUTPATIENT_CLINIC_OR_DEPARTMENT_OTHER)

## 2023-12-30 ENCOUNTER — Other Ambulatory Visit: Payer: Self-pay

## 2023-12-30 ENCOUNTER — Encounter (HOSPITAL_BASED_OUTPATIENT_CLINIC_OR_DEPARTMENT_OTHER): Payer: Self-pay | Admitting: Emergency Medicine

## 2023-12-30 ENCOUNTER — Observation Stay (HOSPITAL_BASED_OUTPATIENT_CLINIC_OR_DEPARTMENT_OTHER)
Admission: EM | Admit: 2023-12-30 | Discharge: 2024-01-02 | Disposition: A | Discharge status: Home / Self Care | Attending: Student | Admitting: Student

## 2023-12-30 DIAGNOSIS — E876 Hypokalemia: Secondary | ICD-10-CM | POA: Insufficient documentation

## 2023-12-30 DIAGNOSIS — E66812 Obesity, class 2: Secondary | ICD-10-CM | POA: Diagnosis present

## 2023-12-30 DIAGNOSIS — Z79899 Other long term (current) drug therapy: Secondary | ICD-10-CM | POA: Diagnosis not present

## 2023-12-30 DIAGNOSIS — Z7984 Long term (current) use of oral hypoglycemic drugs: Secondary | ICD-10-CM | POA: Diagnosis not present

## 2023-12-30 DIAGNOSIS — N1831 Chronic kidney disease, stage 3a: Secondary | ICD-10-CM | POA: Diagnosis not present

## 2023-12-30 DIAGNOSIS — I214 Non-ST elevation (NSTEMI) myocardial infarction: Secondary | ICD-10-CM | POA: Diagnosis not present

## 2023-12-30 DIAGNOSIS — I160 Hypertensive urgency: Secondary | ICD-10-CM | POA: Diagnosis not present

## 2023-12-30 DIAGNOSIS — I5A Non-ischemic myocardial injury (non-traumatic): Secondary | ICD-10-CM

## 2023-12-30 DIAGNOSIS — Z6836 Body mass index (BMI) 36.0-36.9, adult: Secondary | ICD-10-CM | POA: Diagnosis not present

## 2023-12-30 DIAGNOSIS — R112 Nausea with vomiting, unspecified: Secondary | ICD-10-CM

## 2023-12-30 DIAGNOSIS — E1165 Type 2 diabetes mellitus with hyperglycemia: Secondary | ICD-10-CM | POA: Insufficient documentation

## 2023-12-30 DIAGNOSIS — I5043 Acute on chronic combined systolic (congestive) and diastolic (congestive) heart failure: Secondary | ICD-10-CM | POA: Diagnosis not present

## 2023-12-30 DIAGNOSIS — E1122 Type 2 diabetes mellitus with diabetic chronic kidney disease: Secondary | ICD-10-CM | POA: Diagnosis not present

## 2023-12-30 DIAGNOSIS — Z7982 Long term (current) use of aspirin: Secondary | ICD-10-CM | POA: Diagnosis not present

## 2023-12-30 DIAGNOSIS — I2489 Other forms of acute ischemic heart disease: Secondary | ICD-10-CM | POA: Insufficient documentation

## 2023-12-30 DIAGNOSIS — E119 Type 2 diabetes mellitus without complications: Secondary | ICD-10-CM

## 2023-12-30 DIAGNOSIS — E871 Hypo-osmolality and hyponatremia: Secondary | ICD-10-CM | POA: Diagnosis not present

## 2023-12-30 DIAGNOSIS — I130 Hypertensive heart and chronic kidney disease with heart failure and stage 1 through stage 4 chronic kidney disease, or unspecified chronic kidney disease: Secondary | ICD-10-CM | POA: Diagnosis not present

## 2023-12-30 HISTORY — DX: Non-ischemic myocardial injury (non-traumatic): I5A

## 2023-12-30 LAB — COMPREHENSIVE METABOLIC PANEL WITH GFR
ALT: 13 U/L (ref 0–44)
AST: 16 U/L (ref 15–41)
Albumin: 4.6 g/dL (ref 3.5–5.0)
Alkaline Phosphatase: 59 U/L (ref 38–126)
Anion gap: 20 — ABNORMAL HIGH (ref 5–15)
BUN: 16 mg/dL (ref 6–20)
CO2: 23 mmol/L (ref 22–32)
Calcium: 9.6 mg/dL (ref 8.9–10.3)
Chloride: 96 mmol/L — ABNORMAL LOW (ref 98–111)
Creatinine, Ser: 1.52 mg/dL — ABNORMAL HIGH (ref 0.61–1.24)
GFR, Estimated: 55 mL/min — ABNORMAL LOW (ref >60)
Glucose, Bld: 130 mg/dL — ABNORMAL HIGH (ref 70–99)
Potassium: 3.5 mmol/L (ref 3.5–5.1)
Sodium: 139 mmol/L (ref 135–145)
Total Bilirubin: 1 mg/dL (ref 0.0–1.2)
Total Protein: 8 g/dL (ref 6.5–8.1)

## 2023-12-30 LAB — CBG MONITORING, ED: Glucose-Capillary: 98 mg/dL (ref 70–99)

## 2023-12-30 LAB — I-STAT VENOUS BLOOD GAS, ED
Acid-Base Excess: 0 mmol/L (ref 0.0–2.0)
Bicarbonate: 25 mmol/L (ref 20.0–28.0)
Calcium, Ion: 1.15 mmol/L (ref 1.15–1.40)
HCT: 44 % (ref 39.0–52.0)
Hemoglobin: 15 g/dL (ref 13.0–17.0)
O2 Saturation: 52 %
Patient temperature: 97.8
Potassium: 3.2 mmol/L — ABNORMAL LOW (ref 3.5–5.1)
Sodium: 139 mmol/L (ref 135–145)
TCO2: 26 mmol/L (ref 22–32)
pCO2, Ven: 39.8 mmHg — ABNORMAL LOW (ref 44–60)
pH, Ven: 7.405 (ref 7.25–7.43)
pO2, Ven: 27 mmHg — CL (ref 32–45)

## 2023-12-30 LAB — CBC
HCT: 40.8 % (ref 39.0–52.0)
Hemoglobin: 13.9 g/dL (ref 13.0–17.0)
MCH: 29.6 pg (ref 26.0–34.0)
MCHC: 34.1 g/dL (ref 30.0–36.0)
MCV: 86.8 fL (ref 80.0–100.0)
Platelets: 298 K/uL (ref 150–400)
RBC: 4.7 MIL/uL (ref 4.22–5.81)
RDW: 13.9 % (ref 11.5–15.5)
WBC: 7.6 K/uL (ref 4.0–10.5)
nRBC: 0 % (ref 0.0–0.2)

## 2023-12-30 LAB — LIPASE, BLOOD: Lipase: 31 U/L (ref 11–51)

## 2023-12-30 LAB — URINALYSIS, ROUTINE W REFLEX MICROSCOPIC
Glucose, UA: >=500 mg/dL — AB
Hgb urine dipstick: NEGATIVE
Ketones, ur: 40 mg/dL — AB
Leukocytes,Ua: NEGATIVE
Nitrite: NEGATIVE
Protein, ur: 30 mg/dL — AB
Specific Gravity, Urine: 1.01 (ref 1.005–1.030)
pH: 5.5 (ref 5.0–8.0)

## 2023-12-30 LAB — URINALYSIS, MICROSCOPIC (REFLEX)

## 2023-12-30 LAB — TROPONIN T, HIGH SENSITIVITY
Troponin T High Sensitivity: 93 ng/L — ABNORMAL HIGH (ref 0–19)
Troponin T High Sensitivity: 90 ng/L — ABNORMAL HIGH (ref 0–19)

## 2023-12-30 MED ORDER — ONDANSETRON HCL 4 MG/2ML IJ SOLN
4.0000 mg | Freq: Four times a day (QID) | INTRAMUSCULAR | Status: DC | PRN
  Administered 2023-12-31 – 2024-01-01 (×2): 4 mg via INTRAVENOUS
  Filled 2023-12-30 (×2): qty 2

## 2023-12-30 MED ORDER — ASPIRIN 81 MG PO CHEW
324.0000 mg | CHEWABLE_TABLET | Freq: Once | ORAL | Status: AC
  Administered 2023-12-30: 324 mg via ORAL
  Filled 2023-12-30: qty 4

## 2023-12-30 MED ORDER — INSULIN ASPART 100 UNIT/ML IJ SOLN: 0.0000 [IU] | Freq: Every day | INTRAMUSCULAR | Status: DC

## 2023-12-30 MED ORDER — PANTOPRAZOLE SODIUM 40 MG IV SOLR
40.0000 mg | Freq: Once | INTRAVENOUS | Status: AC
  Administered 2023-12-30: 40 mg via INTRAVENOUS
  Filled 2023-12-30: qty 10

## 2023-12-30 MED ORDER — INSULIN ASPART 100 UNIT/ML IJ SOLN: 0.0000 [IU] | Freq: Three times a day (TID) | INTRAMUSCULAR | Status: DC

## 2023-12-30 MED ORDER — HEPARIN BOLUS VIA INFUSION
4000.0000 [IU] | Freq: Once | INTRAVENOUS | Status: AC
  Administered 2023-12-30: 4000 [IU] via INTRAVENOUS

## 2023-12-30 MED ORDER — HEPARIN (PORCINE) 25000 UT/250ML-% IV SOLN
950.0000 [IU]/h | INTRAVENOUS | Status: DC
  Administered 2023-12-30: 1100 [IU]/h via INTRAVENOUS
  Filled 2023-12-30: qty 250

## 2023-12-30 MED ORDER — IOHEXOL 300 MG/ML  SOLN
100.0000 mL | Freq: Once | INTRAMUSCULAR | Status: AC | PRN
  Administered 2023-12-30: 100 mL via INTRAVENOUS

## 2023-12-30 MED ORDER — ISOSORBIDE MONONITRATE ER 60 MG PO TB24
60.0000 mg | ORAL_TABLET | Freq: Every day | ORAL | Status: DC
  Administered 2024-01-01 – 2024-01-02 (×2): 60 mg via ORAL
  Filled 2023-12-30 (×3): qty 1

## 2023-12-30 MED ORDER — ASPIRIN 81 MG PO TBEC
81.0000 mg | DELAYED_RELEASE_TABLET | Freq: Every day | ORAL | Status: DC
  Administered 2023-12-31 – 2024-01-02 (×3): 81 mg via ORAL
  Filled 2023-12-30 (×3): qty 1

## 2023-12-30 MED ORDER — SODIUM CHLORIDE 0.9 % IV BOLUS
1000.0000 mL | Freq: Once | INTRAVENOUS | Status: AC
  Administered 2023-12-30: 1000 mL via INTRAVENOUS

## 2023-12-30 MED ORDER — CARVEDILOL 12.5 MG PO TABS
12.5000 mg | ORAL_TABLET | Freq: Two times a day (BID) | ORAL | Status: DC
  Administered 2023-12-31 – 2024-01-02 (×5): 12.5 mg via ORAL
  Filled 2023-12-30 (×5): qty 1

## 2023-12-30 MED ORDER — ATORVASTATIN CALCIUM 40 MG PO TABS
40.0000 mg | ORAL_TABLET | Freq: Every day | ORAL | Status: DC
  Administered 2023-12-31 – 2024-01-02 (×3): 40 mg via ORAL
  Filled 2023-12-30 (×3): qty 1

## 2023-12-30 MED ORDER — ONDANSETRON HCL 4 MG/2ML IJ SOLN
4.0000 mg | Freq: Once | INTRAMUSCULAR | Status: AC
Start: 2023-12-30 — End: 2023-12-30
  Administered 2023-12-30: 4 mg via INTRAVENOUS
  Filled 2023-12-30: qty 2

## 2023-12-30 MED ORDER — HYDRALAZINE HCL 25 MG PO TABS
25.0000 mg | ORAL_TABLET | Freq: Three times a day (TID) | ORAL | Status: DC
  Administered 2023-12-30 – 2023-12-31 (×2): 25 mg via ORAL
  Filled 2023-12-30 (×2): qty 1

## 2023-12-30 MED ORDER — INSULIN GLARGINE 100 UNIT/ML ~~LOC~~ SOLN
10.0000 [IU] | Freq: Every day | SUBCUTANEOUS | Status: DC
  Filled 2023-12-30: qty 0.1

## 2023-12-30 MED ORDER — INSULIN GLARGINE-YFGN 100 UNIT/ML ~~LOC~~ SOLN
10.0000 [IU] | Freq: Every day | SUBCUTANEOUS | Status: DC
  Filled 2023-12-30: qty 0.1

## 2023-12-30 NOTE — ED Triage Notes (Signed)
 Pt reports vomiting x 6-7 days; sts he is unable to tolerate anything po; reports abd pain that he believes is from vomiting

## 2023-12-30 NOTE — Care Plan (Addendum)
 [facility] cardiac telemetry unit:  53 year old man history of HFrEF recovered EF 55 to 60% and diastolic heart failure, left ventricular hypertrophy, CKD 3A, DM type II entheses A1c above 15 in 08/2023), hyperlipidemia and morbid obesity presented to emergency department complaining of nausea, vomiting for several days and poor appetite.  Patient denies any abdominal pain.  Denies any fever, chill, diarrhea.  Denies any chest pain shortness of breath.  At presentation to ED patient is hemodynamically stable.  Afebrile. CBC unremarkable.  CMP showing creatinine 1.52 and GFR 55.  Renal function at baseline, elevated anion gap 20.  Normal hepatic function and normal lipase level. UA no evidence of UTI except positive ketone. Initial troponin 93 and pending second troponin level. VBG unremarkable.  Chest x-ray no active disease process.  CT abdomen pelvis no acute intra-abdominal finding or obstruction.  Aortic atherosclerosis.  EKG normal sinus rhythm heart rate 94.  There is no ST-T wave abnormality.  Prolonged QTc interval.  In the ED patient has been given aspirin  load 324 mg and started on heparin  drip.  Patient has previous troponin level around 30 and today it is 93.  Concern for atypical presentation of NSTEMI.  Patient does not have any chest pain.  Given patient has elevated troponin as compared to previous baseline in the ED patient has been started treating as an NSTEMI with heparin  drip and received aspirin  load.  Cardiology has been consulted by EDP waiting for recommendation. Also received IV Protonix  40 mg and Zofran  4 mg.  Hospitalist has been consulted for further evaluation management of NSTEMI.   Ashley Montminy, MD [facility] 12/30/2023, 11:04 PM

## 2023-12-30 NOTE — Progress Notes (Signed)
 PHARMACY - ANTICOAGULATION CONSULT NOTE  Pharmacy Consult for heparin  Indication: chest pain/ACS  Allergies  Allergen Reactions   Lisinopril Cough    Patient Measurements: Height: 5' 10 (177.8 cm) Weight: 114.3 kg (252 lb) IBW/kg (Calculated) : 73 HEPARIN  DW (KG): 98.2  Vital Signs: Temp: 98.8 F (37.1 C) (09/14 1919) Temp Source: Oral (09/14 1919) BP: 173/96 (09/14 2200) Pulse Rate: 94 (09/14 2200)  Labs: Recent Labs    12/30/23 1922 12/30/23 2124  HGB 13.9 15.0  HCT 40.8 44.0  PLT 298  --   CREATININE 1.52*  --     Estimated Creatinine Clearance: 73.6 mL/min (A) (by C-G formula based on SCr of 1.52 mg/dL (H)).   Medical History: Past Medical History:  Diagnosis Date   CHF (congestive heart failure) (HCC)    Diabetes mellitus without complication (HCC)    Hypertension    Assessment: 34 yoM presented to ED with abdominal pain/vomiting for last week. Pharmacy consulted to dose heparin  for ACS.  -CBC stable -No PTA oral anticoagulation -Trop 93 -EKG: LBBB  Goal of Therapy:  Heparin  level 0.3-0.7 units/ml Monitor platelets by anticoagulation protocol: Yes   Plan:  Give 4000 units bolus x 1 Start heparin  infusion at 1100 units/hr Check anti-Xa level in 6 hours and daily while on heparin  Continue to monitor H&H and platelets -F/u cardiology consult   Lynwood Poplar, PharmD, BCPS Clinical Pharmacist 12/30/2023 10:39 PM

## 2023-12-30 NOTE — ED Provider Notes (Signed)
 [place] [facility] Provider Note  CSN: [id number] Arrival date & time: 12/30/23 1904  Chief Complaint(s) Emesis  HPI Terry Young is a 53 y.o. male with history of heart failure, diabetes, here today for nausea.  Patient reports he been having nausea over the last several days, has had a difficult time keeping anything down.  He has not had any significant abdominal pain, just some pain when he has vomiting.  He has not felt short of breath, denies any overt chest pain.   Past Medical History Past Medical History:  Diagnosis Date   CHF (congestive heart failure) (HCC)    Diabetes mellitus without complication (HCC)    Hypertension    Patient Active Problem List   Diagnosis Date Noted   Hypokalemia 01/29/2021   Obesity, Class III, BMI 40-49.9 (morbid obesity) 01/29/2021   Systolic CHF, acute on chronic (HCC) 01/27/2021   Acute on chronic combined systolic and diastolic CHF (congestive heart failure) (HCC) 01/26/2021   DM2 (diabetes mellitus, type 2) (HCC) 01/26/2021   HTN (hypertension) 01/26/2021   Home Medication(s) Prior to Admission medications   Medication Sig Start Date End Date Taking? Authorizing Provider  acetaminophen  (TYLENOL ) 325 MG tablet Take 650 mg by mouth as needed.    [provider]  aspirin  81 MG EC tablet Take 1 tablet (81 mg total) by mouth daily. 01/31/21   Patsy Lenis, MD  atorvastatin  (LIPITOR) 40 MG tablet Take 1 tablet (40 mg total) by mouth daily. 10/30/23   Bensimhon, Toribio SAUNDERS, MD  carvedilol  (COREG ) 12.5 MG tablet Take 1 tablet (12.5 mg total) by mouth 2 (two) times daily with a meal. 10/30/23   Bensimhon, Toribio SAUNDERS, MD  empagliflozin  (JARDIANCE ) 10 MG TABS tablet Take 1 tablet (10 mg total) by mouth daily before breakfast. 10/30/23   Bensimhon, Toribio SAUNDERS, MD  furosemide  (LASIX ) 40 MG tablet Take 1 tablet (40 mg total) by mouth daily. 10/30/23   Bensimhon, Toribio SAUNDERS, MD  hydrALAZINE  (APRESOLINE ) 25 MG tablet  Take 1 tablet (25 mg total) by mouth 3 (three) times daily. 10/30/23   Bensimhon, Toribio SAUNDERS, MD  Insulin  Pen Needle 32G X 4 MM MISC Use to inject insulin  as directed. 01/31/21   Patsy Lenis, MD  isosorbide  mononitrate (IMDUR ) 60 MG 24 hr tablet Take 1 tablet (60 mg total) by mouth daily. 10/30/23   Bensimhon, Toribio SAUNDERS, MD  metFORMIN  (GLUCOPHAGE ) 1000 MG tablet Take 1 tablet (1,000 mg total) by mouth 2 (two) times daily with a meal. Must have office visit for refills 08/16/23   Newlin, Enobong, MD  Multiple Vitamins-Minerals (CENTRUM MEN) TABS Take 1 tablet by mouth daily.    [provider]  pregabalin  (LYRICA ) 50 MG capsule Take 1 capsule (50 mg total) by mouth 2 (two) times daily. 11/28/23   Newlin, Enobong, MD  sacubitril -valsartan  (ENTRESTO ) 97-103 MG Take 1 tablet by mouth 2 (two) times daily. Please call  office to schedule an appointment 10/17/23   Bensimhon, Toribio SAUNDERS, MD  Semaglutide , 2 MG/DOSE, 8 MG/3ML SOPN Inject 2 mg as directed once a week. 10/18/23   Newlin, Enobong, MD  Past Surgical History History reviewed. No pertinent surgical history. Family History Family History  Problem Relation Age of Onset   Atrial fibrillation Neg Hx     Social History Social History   Tobacco Use   Smoking status: Never   Smokeless tobacco: Never  Substance Use Topics   Alcohol use: Never   Drug use: Never   Allergies Lisinopril  Review of Systems Review of Systems  Physical Exam Vital Signs  I have reviewed the triage vital signs BP (!) 173/96   Pulse 94   Temp 98.8 F (37.1 C) (Oral)   Resp 15   Ht 5' 10 (1.778 m)   Wt 114.3 kg   SpO2 99%   BMI 36.16 kg/m   Physical Exam Vitals and nursing note reviewed.  Eyes:     Pupils: Pupils are equal, round, and reactive to light.  Cardiovascular:     Rate and Rhythm: Normal rate.  Pulmonary:      Effort: Pulmonary effort is normal.  Abdominal:     General: Abdomen is flat. There is no distension.     Palpations: Abdomen is soft.     Tenderness: There is no abdominal tenderness.  Musculoskeletal:     Cervical back: Normal range of motion.  Neurological:     Mental Status: He is alert.     ED Results and Treatments Labs (all labs ordered are listed, but only abnormal results are displayed) Labs Reviewed  COMPREHENSIVE METABOLIC PANEL WITH GFR - Abnormal; Notable for the following components:      Result Value   Chloride 96 (*)    Glucose, Bld 130 (*)    Creatinine, Ser 1.52 (*)    GFR, Estimated 55 (*)    Anion gap 20 (*)    All other components within normal limits  URINALYSIS, ROUTINE W REFLEX MICROSCOPIC - Abnormal; Notable for the following components:   Glucose, UA >=500 (*)    Bilirubin Urine LARGE (*)    Ketones, ur 40 (*)    Protein, ur 30 (*)    All other components within normal limits  URINALYSIS, MICROSCOPIC (REFLEX) - Abnormal; Notable for the following components:   Bacteria, UA FEW (*)    All other components within normal limits  I-STAT VENOUS BLOOD GAS, ED - Abnormal; Notable for the following components:   pCO2, Ven 39.8 (*)    pO2, Ven 27 (*)    Potassium 3.2 (*)    All other components within normal limits  TROPONIN T, HIGH SENSITIVITY - Abnormal; Notable for the following components:   Troponin T High Sensitivity 93 (*)    All other components within normal limits  LIPASE, BLOOD  CBC  TROPONIN T, HIGH SENSITIVITY                                                                                                                          Radiology CT ABDOMEN PELVIS W CONTRAST Result Date: 12/30/2023 EXAM: CT ABDOMEN AND  PELVIS WITH CONTRAST 12/30/2023 09:26:06 PM TECHNIQUE: CT of the abdomen and pelvis was performed with the administration of intravenous contrast. Multiplanar reformatted images are provided for review. Automated exposure control,  iterative reconstruction, and/or weight-based adjustment of the mA/kV was utilized to reduce the radiation dose to as low as reasonably achievable. COMPARISON: None available. CLINICAL HISTORY: Bowel obstruction suspected. Pt reports vomiting x 6-7 days; sts he is unable to tolerate anything po; reports abd pain that he believes is from vomiting. FINDINGS: LOWER CHEST: No acute abnormality. LIVER: The liver is unremarkable. GALLBLADDER AND BILE DUCTS: Gallbladder is unremarkable. No biliary ductal dilatation. SPLEEN: No acute abnormality. PANCREAS: No acute abnormality. ADRENAL GLANDS: No acute abnormality. KIDNEYS, URETERS AND BLADDER: No stones in the kidneys or ureters. No hydronephrosis. No perinephric or periureteral stranding. Urinary bladder is unremarkable. GI AND BOWEL: Stomach demonstrates no acute abnormality. There is no bowel obstruction. PERITONEUM AND RETROPERITONEUM: No ascites. No free air. VASCULATURE: Mild aortoiliac atherosclerotic calcification. LYMPH NODES: No lymphadenopathy. REPRODUCTIVE ORGANS: No acute abnormality. BONES AND SOFT TISSUES: No acute osseous abnormality. No focal soft tissue abnormality. IMPRESSION: 1. No acute findings of bowel obstruction. 2. Mild aortoiliac atherosclerotic calcification. raf score: Aortic atherosclerosis (ICD10-I70.0) Electronically signed by: Dorethia Molt MD 12/30/2023 09:32 PM EDT RP Workstation: [id number]    Pertinent labs & imaging results that were available during my care of the patient were reviewed by me and considered in my medical decision making (see MDM for details).  Medications Ordered in ED Medications  aspirin  chewable tablet 324 mg (has no administration in time range)  ondansetron  (ZOFRAN ) injection 4 mg (4 mg Intravenous Given 12/30/23 2102)  sodium chloride  0.9 % bolus 1,000 mL (1,000 mLs Intravenous New Bag/Given 12/30/23 2102)  pantoprazole  (PROTONIX ) injection 40 mg (40 mg Intravenous Given 12/30/23 2141)  iohexol   (OMNIPAQUE ) 300 MG/ML solution 100 mL (100 mLs Intravenous Contrast Given 12/30/23 2116)                                                                                                                                     Procedures .Critical Care  Performed by: Mannie Fairy DASEN, DO Authorized by: Mannie Fairy DASEN, DO   Critical care provider statement:    Critical care time (minutes):  30   Critical care was necessary to treat or prevent imminent or life-threatening deterioration of the following conditions:  Cardiac failure   Critical care was time spent personally by me on the following activities:  Development of treatment plan with patient or surrogate, discussions with consultants, evaluation of patient's response to treatment, examination of patient, ordering and review of laboratory studies, ordering and review of radiographic studies, ordering and performing treatments and interventions, pulse oximetry, re-evaluation of patient's condition and review of old charts   (including critical care time)  Medical Decision Making / ED Course   This patient presents to the ED for concern of nausea, this involves  an extensive number of treatment options, and is a complaint that carries with it a high risk of complications and morbidity.  The differential diagnosis includes enteritis, gastritis, ACS, less likely obstruction, less likely biliary colic.  MDM: Patient with normal vital signs, soft abdomen, will assuring exam.  Patient does have a history significant for heart failure, has multiple cardiac risk factors including diabetes, obesity.  Obtained an EKG on the patient which shows left bundle branch block without acute ischemia.  His initial troponin was elevated 93.  Unclear if this is now his baseline, have previous troponins in the 30s.  Given his symptoms, and risk factors, I do believe that his symptoms could be related to ACS.  I have started the patient on heparin .  Have placed  cardiology consultation.  Will admit patient.  His CT imaging was negative.  Blood work aside from troponin overall normal.   Additional history obtained:  -External records from outside source obtained and reviewed including: Chart review including previous notes, labs, imaging, consultation notes   Lab Tests: -I ordered, reviewed, and interpreted labs.   The pertinent results include:   Labs Reviewed  COMPREHENSIVE METABOLIC PANEL WITH GFR - Abnormal; Notable for the following components:      Result Value   Chloride 96 (*)    Glucose, Bld 130 (*)    Creatinine, Ser 1.52 (*)    GFR, Estimated 55 (*)    Anion gap 20 (*)    All other components within normal limits  URINALYSIS, ROUTINE W REFLEX MICROSCOPIC - Abnormal; Notable for the following components:   Glucose, UA >=500 (*)    Bilirubin Urine LARGE (*)    Ketones, ur 40 (*)    Protein, ur 30 (*)    All other components within normal limits  URINALYSIS, MICROSCOPIC (REFLEX) - Abnormal; Notable for the following components:   Bacteria, UA FEW (*)    All other components within normal limits  I-STAT VENOUS BLOOD GAS, ED - Abnormal; Notable for the following components:   pCO2, Ven 39.8 (*)    pO2, Ven 27 (*)    Potassium 3.2 (*)    All other components within normal limits  TROPONIN T, HIGH SENSITIVITY - Abnormal; Notable for the following components:   Troponin T High Sensitivity 93 (*)    All other components within normal limits  LIPASE, BLOOD  CBC  TROPONIN T, HIGH SENSITIVITY      EKG   EKG Interpretation Date/Time:  Sunday December 30 2023 20:59:47 EDT Ventricular Rate:  94 PR Interval:  151 QRS Duration:  132 QT Interval:  410 QTC Calculation: 513 R Axis:   -75  Text Interpretation: Sinus rhythm Nonspecific IVCD with LAD Inferior infarct, old Anterior infarct, old Lateral leads are also involved Confirmed by Mannie Pac [phone]) on 12/30/2023 9:06:49 PM         Imaging Studies ordered: I  ordered imaging studies including CT abdomen pelvis, chest x-ray I independently visualized and interpreted imaging. I agree with the radiologist interpretation   Medicines ordered and prescription drug management: Meds ordered this encounter  Medications   ondansetron  (ZOFRAN ) injection 4 mg   sodium chloride  0.9 % bolus 1,000 mL   pantoprazole  (PROTONIX ) injection 40 mg   iohexol  (OMNIPAQUE ) 300 MG/ML solution 100 mL   aspirin  chewable tablet 324 mg    -I have reviewed the patients home medicines and have made adjustments as needed  Critical interventions Management of NSTEMI   Cardiac Monitoring: The patient  was maintained on a cardiac monitor.  I personally viewed and interpreted the cardiac monitored which showed an underlying rhythm of: Normal sinus rhythm  Social Determinants of Health:  Factors impacting patients care include: Multiple medical comorbidities including CHF and diabetes.   Reevaluation: After the interventions noted above, I reevaluated the patient and found that they have :improved  Co morbidities that complicate the patient evaluation  Past Medical History:  Diagnosis Date   CHF (congestive heart failure) (HCC)    Diabetes mellitus without complication (HCC)    Hypertension           Final Clinical Impression(s) / ED Diagnoses Final diagnoses:  NSTEMI (non-ST elevated myocardial infarction) (HCC)  Nausea and vomiting, unspecified vomiting type     @PCDICTATION @    Mannie Pac T, DO 12/30/23 2239

## 2023-12-31 ENCOUNTER — Inpatient Hospital Stay (HOSPITAL_BASED_OUTPATIENT_CLINIC_OR_DEPARTMENT_OTHER)

## 2023-12-31 DIAGNOSIS — I10 Essential (primary) hypertension: Secondary | ICD-10-CM | POA: Diagnosis not present

## 2023-12-31 DIAGNOSIS — R079 Chest pain, unspecified: Secondary | ICD-10-CM

## 2023-12-31 DIAGNOSIS — I214 Non-ST elevation (NSTEMI) myocardial infarction: Secondary | ICD-10-CM | POA: Diagnosis not present

## 2023-12-31 DIAGNOSIS — I169 Hypertensive crisis, unspecified: Secondary | ICD-10-CM | POA: Diagnosis not present

## 2023-12-31 DIAGNOSIS — R7989 Other specified abnormal findings of blood chemistry: Secondary | ICD-10-CM

## 2023-12-31 DIAGNOSIS — Z743 Need for continuous supervision: Secondary | ICD-10-CM | POA: Diagnosis not present

## 2023-12-31 DIAGNOSIS — R112 Nausea with vomiting, unspecified: Secondary | ICD-10-CM | POA: Diagnosis present

## 2023-12-31 DIAGNOSIS — I2489 Other forms of acute ischemic heart disease: Secondary | ICD-10-CM

## 2023-12-31 DIAGNOSIS — R110 Nausea: Secondary | ICD-10-CM | POA: Diagnosis not present

## 2023-12-31 DIAGNOSIS — I5022 Chronic systolic (congestive) heart failure: Secondary | ICD-10-CM

## 2023-12-31 LAB — ECHOCARDIOGRAM COMPLETE
AR max vel: 3.38 cm2
AV Area VTI: 3.35 cm2
AV Area mean vel: 2.84 cm2
AV Mean grad: 3 mmHg
AV Peak grad: 5.9 mmHg
Ao pk vel: 1.21 m/s
Area-P 1/2: 7.74 cm2
Calc EF: 57.4 %
Height: 70 in
MV VTI: 3.96 cm2
S' Lateral: 3.9 cm
Single Plane A2C EF: 57.7 %
Single Plane A4C EF: 57.4 %
Weight: 4134.07 [oz_av]

## 2023-12-31 LAB — TROPONIN T, HIGH SENSITIVITY: Troponin T High Sensitivity: 94 ng/L — ABNORMAL HIGH (ref 0–19)

## 2023-12-31 LAB — RESPIRATORY PANEL BY PCR

## 2023-12-31 LAB — CBC
HCT: 41 % (ref 39.0–52.0)
Hemoglobin: 13.8 g/dL (ref 13.0–17.0)
MCH: 29.4 pg (ref 26.0–34.0)
MCHC: 33.7 g/dL (ref 30.0–36.0)
MCV: 87.2 fL (ref 80.0–100.0)
Platelets: 259 K/uL (ref 150–400)
RBC: 4.7 MIL/uL (ref 4.22–5.81)
RDW: 13.8 % (ref 11.5–15.5)
WBC: 6.5 K/uL (ref 4.0–10.5)
nRBC: 0 % (ref 0.0–0.2)

## 2023-12-31 LAB — CBG MONITORING, ED
Glucose-Capillary: 104 mg/dL — ABNORMAL HIGH (ref 70–99)
Glucose-Capillary: 102 mg/dL — ABNORMAL HIGH (ref 70–99)

## 2023-12-31 LAB — GLUCOSE, CAPILLARY
Glucose-Capillary: 84 mg/dL (ref 70–99)
Glucose-Capillary: 118 mg/dL — ABNORMAL HIGH (ref 70–99)
Glucose-Capillary: 96 mg/dL (ref 70–99)

## 2023-12-31 LAB — HIV ANTIBODY (ROUTINE TESTING W REFLEX): HIV Screen 4th Generation wRfx: NONREACTIVE

## 2023-12-31 LAB — HEPARIN LEVEL (UNFRACTIONATED)
Heparin Unfractionated: 0.86 [IU]/mL — ABNORMAL HIGH (ref 0.30–0.70)
Heparin Unfractionated: 0.46 [IU]/mL (ref 0.30–0.70)

## 2023-12-31 MED ORDER — INSULIN ASPART 100 UNIT/ML IJ SOLN: 0.0000 [IU] | Freq: Three times a day (TID) | INTRAMUSCULAR | Status: DC

## 2023-12-31 MED ORDER — POLYETHYLENE GLYCOL 3350 17 G PO PACK
17.0000 g | PACK | Freq: Every day | ORAL | Status: DC
  Filled 2023-12-31: qty 1

## 2023-12-31 MED ORDER — SENNOSIDES-DOCUSATE SODIUM 8.6-50 MG PO TABS
2.0000 | ORAL_TABLET | Freq: Two times a day (BID) | ORAL | Status: DC
  Filled 2023-12-31 (×4): qty 2

## 2023-12-31 MED ORDER — ACETAMINOPHEN 325 MG PO TABS
650.0000 mg | ORAL_TABLET | Freq: Four times a day (QID) | ORAL | Status: DC | PRN
  Administered 2023-12-31 – 2024-01-01 (×2): 650 mg via ORAL
  Filled 2023-12-31 (×2): qty 2

## 2023-12-31 MED ORDER — ORAL CARE MOUTH RINSE: 15.0000 mL | OROMUCOSAL | Status: DC | PRN

## 2023-12-31 MED ORDER — FUROSEMIDE 40 MG PO TABS
40.0000 mg | ORAL_TABLET | Freq: Every day | ORAL | Status: DC
  Administered 2023-12-31 – 2024-01-02 (×3): 40 mg via ORAL
  Filled 2023-12-31 (×3): qty 1

## 2023-12-31 MED ORDER — INSULIN GLARGINE 100 UNIT/ML ~~LOC~~ SOLN
10.0000 [IU] | Freq: Every day | SUBCUTANEOUS | Status: DC
  Administered 2024-01-01 – 2024-01-02 (×2): 10 [IU] via SUBCUTANEOUS
  Filled 2023-12-31 (×2): qty 0.1

## 2023-12-31 MED ORDER — FUROSEMIDE 10 MG/ML IJ SOLN
40.0000 mg | Freq: Two times a day (BID) | INTRAMUSCULAR | Status: DC
  Filled 2023-12-31: qty 4

## 2023-12-31 MED ORDER — EMPAGLIFLOZIN 10 MG PO TABS
10.0000 mg | ORAL_TABLET | Freq: Every day | ORAL | Status: DC
  Administered 2024-01-01 – 2024-01-02 (×2): 10 mg via ORAL
  Filled 2023-12-31 (×2): qty 1

## 2023-12-31 MED ORDER — POLYETHYLENE GLYCOL 3350 17 G PO PACK
17.0000 g | PACK | Freq: Two times a day (BID) | ORAL | Status: DC
  Filled 2023-12-31 (×3): qty 1

## 2023-12-31 MED ORDER — SACUBITRIL-VALSARTAN 97-103 MG PO TABS
1.0000 | ORAL_TABLET | Freq: Two times a day (BID) | ORAL | Status: DC
  Administered 2023-12-31 – 2024-01-02 (×4): 1 via ORAL
  Filled 2023-12-31 (×5): qty 1

## 2023-12-31 MED ORDER — MELATONIN 5 MG PO TABS
5.0000 mg | ORAL_TABLET | Freq: Every evening | ORAL | Status: DC | PRN
  Administered 2023-12-31: 5 mg via ORAL
  Filled 2023-12-31 (×2): qty 1

## 2023-12-31 MED ORDER — PREGABALIN 25 MG PO CAPS
50.0000 mg | ORAL_CAPSULE | Freq: Two times a day (BID) | ORAL | Status: DC
  Administered 2023-12-31 – 2024-01-02 (×5): 50 mg via ORAL
  Filled 2023-12-31 (×5): qty 2

## 2023-12-31 MED ORDER — INSULIN GLARGINE 100 UNIT/ML ~~LOC~~ SOLN
10.0000 [IU] | Freq: Every day | SUBCUTANEOUS | Status: DC
  Filled 2023-12-31: qty 0.1

## 2023-12-31 NOTE — Progress Notes (Signed)
 PHARMACY - ANTICOAGULATION CONSULT NOTE  Pharmacy Consult for heparin  Indication: chest pain/ACS  Allergies  Allergen Reactions   Lisinopril Cough    Patient Measurements: Height: 5' 10 (177.8 cm) Weight: 117.2 kg (258 lb 6.1 oz) IBW/kg (Calculated) : 73 HEPARIN  DW (KG): 99  Vital Signs: Temp: 97.8 F (36.6 C) (09/15 1353) Temp Source: Oral (09/15 1353) BP: 179/105 (09/15 1403) Pulse Rate: 93 (09/15 1403)  Labs: Recent Labs    12/30/23 1922 12/30/23 2124 12/31/23 0550 12/31/23 1554  HGB 13.9 15.0 13.8  --   HCT 40.8 44.0 41.0  --   PLT 298  --  259  --   HEPARINUNFRC  --   --  0.86* 0.46  CREATININE 1.52*  --   --   --     Estimated Creatinine Clearance: 74.6 mL/min (A) (by C-G formula based on SCr of 1.52 mg/dL (H)).   Medical History: Past Medical History:  Diagnosis Date   CHF (congestive heart failure) (HCC)    Diabetes mellitus without complication (HCC)    Hypertension    Assessment: 42 yoM presented to ED with abdominal pain/vomiting for last week. Pharmacy consulted to dose heparin  for ACS.  Heparin  level therapeutic at 0.46 with heparin  running at 950 units/hour. No bleeding noted.  Goal of Therapy:  Heparin  level 0.3-0.7 units/ml Monitor platelets by anticoagulation protocol: Yes   Plan:  Continue heparin  at 950 units/hour  Daily HL/CBC  F/u cardiology consult   Massie Fila, PharmD Clinical Pharmacist  12/31/2023 4:45 PM

## 2023-12-31 NOTE — ED Notes (Signed)
 Report received from [place], [place]. Assuming pt care at this time.

## 2023-12-31 NOTE — ED Notes (Signed)
 Purple man noted to be green for handoff

## 2023-12-31 NOTE — Progress Notes (Signed)
 Cardiology Consultation   Patient ID: Terry Young MRN: [id number]; DOB: September 16, 1970  Admit date: 12/30/2023 Date of Consult: 12/31/2023  PCP:  [name], MD   [facility] Providers Cardiologist:  None      Patient Profile: Terry Young is a 53 y.o. male with a hx of biventricular heart failure with improved EF, type 2 DM, HLD, obesity, HTN who is being seen 12/31/2023 for the evaluation of elevated troponin at the request of Dr. Georgina.  History of Present Illness: Terry Young is a 53 year old male with above medical history. Patient previously followed by [facility] cardiology. Previously diagnosed with CHF in approximately 2015. Reportedly was diagnosed with CHF shortly after a viral illness. He had an echocardiogram in 2018 that showed EF 40-45%.   Later admitted in 01/2021 with CHF. Echocardiogram 01/2021 showed EF <20%, grade II DD, severely reduced RV systolic function, mild MR, mild-moderate TR. Patient was started on GDMT. Repeat echo in 11/2021 showed EF 55-60%, no regional wall motion abnormalities, mild LVH, grade I DD, normal RV systolic function, no significant valvular abnormalities. Patient was last seen by advanced heart failure on 10/01/23. At that time, patient was slightly volume up on exam. Remained on jardiance  10 mg daily, entresto  97/103 mg BID, carvedilol  12.5 mg BID, imdur  30 mg daily, hydralazine  50 mg TID. Was not on spironolactone  due to hyperkalemia.   Patient presented to the ED 9/14 with nausea and vomiting. He also had some abdominal pain which he believed was from the vomiting. Initial vital signs showed BP 131/102, oxygen 99% on room air.  Labs significant for hsTn 93>90>94. Creatinine 1.52. CBC within normal limits. CT abdomen/pelvis showed no acute finding of bowel obstruction, mild aortoiliac atherosclerotic calcification.   Patient was started on IV heparin . Admitted to the internal medicine service for treatment of HTN, elevated troponin.   He reports she  has had no chest pain.  Symptoms are nausea and vomiting.  His blood pressure is severely elevated during the time of examination.  He has not been able to take his medications for the past week.  BP during my exam 179/105.  He endorses no chest discomfort.  He was trying to eat dinner but unable to tolerate any oral intake.  He is quite concerned about this.  A 2D echocardiogram was obtained that shows normal LV function 55 to 60% with no regional wall motion abnormality.  Past Medical History:  Diagnosis Date   CHF (congestive heart failure) (HCC)    Diabetes mellitus without complication (HCC)    Hypertension     History reviewed. No pertinent surgical history.   Home Medications:  Prior to Admission medications   Medication Sig Start Date End Date Taking? Authorizing Provider  acetaminophen  (TYLENOL ) 325 MG tablet Take 650 mg by mouth as needed.    [provider]  aspirin  81 MG EC tablet Take 1 tablet (81 mg total) by mouth daily. 01/31/21   Patsy Lenis, MD  atorvastatin  (LIPITOR) 40 MG tablet Take 1 tablet (40 mg total) by mouth daily. 10/30/23   Bensimhon, Toribio SAUNDERS, MD  carvedilol  (COREG ) 12.5 MG tablet Take 1 tablet (12.5 mg total) by mouth 2 (two) times daily with a meal. 10/30/23   Bensimhon, Toribio SAUNDERS, MD  empagliflozin  (JARDIANCE ) 10 MG TABS tablet Take 1 tablet (10 mg total) by mouth daily before breakfast. 10/30/23   Bensimhon, Toribio SAUNDERS, MD  furosemide  (LASIX ) 40 MG tablet Take 1 tablet (40 mg total) by mouth daily. 10/30/23  Bensimhon, Toribio SAUNDERS, MD  hydrALAZINE  (APRESOLINE ) 25 MG tablet Take 1 tablet (25 mg total) by mouth 3 (three) times daily. 10/30/23   Bensimhon, Toribio SAUNDERS, MD  Insulin  Pen Needle 32G X 4 MM MISC Use to inject insulin  as directed. 01/31/21   Patsy Lenis, MD  isosorbide  mononitrate (IMDUR ) 60 MG 24 hr tablet Take 1 tablet (60 mg total) by mouth daily. 10/30/23   Bensimhon, Toribio SAUNDERS, MD  metFORMIN  (GLUCOPHAGE ) 1000 MG tablet Take 1 tablet (1,000 mg  total) by mouth 2 (two) times daily with a meal. Must have office visit for refills 08/16/23   Newlin, Enobong, MD  Multiple Vitamins-Minerals (CENTRUM MEN) TABS Take 1 tablet by mouth daily.    [provider]  pregabalin  (LYRICA ) 50 MG capsule Take 1 capsule (50 mg total) by mouth 2 (two) times daily. 11/28/23   Newlin, Enobong, MD  sacubitril -valsartan  (ENTRESTO ) 97-103 MG Take 1 tablet by mouth 2 (two) times daily. Please call  office to schedule an appointment 10/17/23   Bensimhon, Toribio SAUNDERS, MD  Semaglutide , 2 MG/DOSE, 8 MG/3ML SOPN Inject 2 mg as directed once a week. 10/18/23   Newlin, Enobong, MD    Scheduled Meds:  aspirin  EC  81 mg Oral Daily   atorvastatin   40 mg Oral Daily   carvedilol   12.5 mg Oral BID WC   [START ON 01/01/2024] empagliflozin   10 mg Oral QAC breakfast   furosemide   40 mg Intravenous BID   insulin  aspart  0-15 Units Subcutaneous TID WC   insulin  glargine  10 Units Subcutaneous QHS   isosorbide  mononitrate  60 mg Oral Daily   pregabalin   50 mg Oral BID   sacubitril -valsartan   1 tablet Oral BID   Continuous Infusions:  heparin  950 Units/hr (12/31/23 1403)   PRN Meds: ondansetron  (ZOFRAN ) IV, mouth rinse  Allergies:    Allergies  Allergen Reactions   Lisinopril Cough    Social History:   Social History   Socioeconomic History   Marital status: Single    Spouse name: Not on file   Number of children: Not on file   Years of education: Not on file   Highest education level: Not on file  Occupational History   Not on file  Tobacco Use   Smoking status: Never   Smokeless tobacco: Never  Substance and Sexual Activity   Alcohol use: Never   Drug use: Never   Sexual activity: Not on file  Other Topics Concern   Not on file  Social History Narrative   Not on file   Social Drivers of Health   Financial Resource Strain: High Risk (02/07/2021)   Overall Financial Resource Strain (CARDIA)    Difficulty of Paying Living Expenses: Very hard  Food  Insecurity: No Food Insecurity (12/31/2023)   Hunger Vital Sign    Worried About Running Out of Food in the Last Year: Never true    Ran Out of Food in the Last Year: Never true  Transportation Needs: No Transportation Needs (12/31/2023)   PRAPARE - Administrator, Civil Service (Medical): No    Lack of Transportation (Non-Medical): No  Physical Activity: Not on file  Stress: Not on file  Social Connections: Not on file  Intimate Partner Violence: Not At Risk (12/31/2023)   Humiliation, Afraid, Rape, and Kick questionnaire    Fear of Current or Ex-Partner: No    Emotionally Abused: No    Physically Abused: No    Sexually Abused: No  Family History:    Family History  Problem Relation Age of Onset   Atrial fibrillation Neg Hx      ROS:  Please see the history of present illness.   All other ROS reviewed and negative.     Physical Exam/Data: Vitals:   12/31/23 1230 12/31/23 1300 12/31/23 1353 12/31/23 1403  BP:  (!) 155/100  (!) 179/105  Pulse: 95 94  93  Resp:  14  17  Temp:  98.5 F (36.9 C) 97.8 F (36.6 C)   TempSrc:  Oral Oral   SpO2: 99% 98%  100%  Weight:    117.2 kg  Height:   5' 10 (1.778 m) 5' 10 (1.778 m)    Intake/Output Summary (Last 24 hours) at 12/31/2023 1635 Last data filed at 12/31/2023 1403 Gross per 24 hour  Intake 1194.25 ml  Output 300 ml  Net 894.25 ml      12/31/2023    2:03 PM 12/30/2023    7:20 PM 11/19/2023   10:56 AM  Last 3 Weights  Weight (lbs) 258 lb 6.1 oz 252 lb 284 lb  Weight (kg) 117.2 kg 114.306 kg 128.822 kg     Body mass index is 37.07 kg/m.  General:  Well nourished, well developed, in no acute distress HEENT: normal Neck: no JVD Vascular: No carotid bruits; Distal pulses 2+ bilaterally Cardiac:  normal S1, S2; RRR; no murmur  Lungs:  clear to auscultation bilaterally, no wheezing, rhonchi or rales  Abd: soft, nontender, no hepatomegaly  Ext: no edema Musculoskeletal:  No deformities, BUE and BLE  strength normal and equal Skin: warm and dry  Neuro:  CNs 2-12 intact, no focal abnormalities noted Psych:  Normal affect   EKG:  The EKG was personally reviewed and demonstrates:  SR anterior and inferior Q waves (unchanged from prior) Telemetry:  Telemetry was personally reviewed and demonstrates: Sinus rhythm 90s  Relevant CV Studies: TTE  12/31/2023  1. Left ventricular ejection fraction, by estimation, is 55 to 60%. The  left ventricle has normal function. Left ventricular endocardial border  not optimally defined to evaluate regional wall motion. There is mild  concentric left ventricular  hypertrophy. Left ventricular diastolic parameters are consistent with  Grade I diastolic dysfunction (impaired relaxation).   2. Right ventricular systolic function is normal. The right ventricular  size is normal.   3. The mitral valve is normal in structure. No evidence of mitral valve  regurgitation.   4. The aortic valve is normal in structure. Aortic valve regurgitation is  not visualized.    Laboratory Data: High Sensitivity Troponin:  No results for input(s): TROPONINIHS in the last 720 hours.   Chemistry Recent Labs  Lab 12/30/23 1922 12/30/23 2124  NA 139 139  K 3.5 3.2*  CL 96*  --   CO2 23  --   GLUCOSE 130*  --   BUN 16  --   CREATININE 1.52*  --   CALCIUM  9.6  --   GFRNONAA 55*  --   ANIONGAP 20*  --     Recent Labs  Lab 12/30/23 1922  PROT 8.0  ALBUMIN  4.6  AST 16  ALT 13  ALKPHOS 59  BILITOT 1.0   Lipids No results for input(s): CHOL, TRIG, HDL, LABVLDL, LDLCALC, CHOLHDL in the last 168 hours.  Hematology Recent Labs  Lab 12/30/23 1922 12/30/23 2124 12/31/23 0550  WBC 7.6  --  6.5  RBC 4.70  --  4.70  HGB 13.9 15.0 13.8  HCT 40.8 44.0 41.0  MCV 86.8  --  87.2  MCH 29.6  --  29.4  MCHC 34.1  --  33.7  RDW 13.9  --  13.8  PLT 298  --  259   Thyroid No results for input(s): TSH, FREET4 in the last 168 hours.  BNPNo results for  input(s): BNP, PROBNP in the last 168 hours.  DDimer No results for input(s): DDIMER in the last 168 hours.  Radiology/Studies:  ECHOCARDIOGRAM COMPLETE Result Date: 12/31/2023    ECHOCARDIOGRAM REPORT   Patient Name:   Terry Young Date of Exam: 12/31/2023 Medical Rec #:  [id number]  Height:       70.0 in Accession #:    [id number] Weight:       258.4 lb Date of Birth:  1970-12-30  BSA:          2.327 m Patient Age:    52 years   BP:           155/100 mmHg Patient Gender: M          HR:           93 bpm. Exam Location:  Inpatient Procedure: 2D Echo, Cardiac Doppler and Color Doppler (Both Spectral and Color            Flow Doppler were utilized during procedure). Indications:    Chest Pain R07.9  History:        Patient has prior history of Echocardiogram examinations, most                 recent 01/27/2021. CHF; Risk Factors:Hypertension and Diabetes.  Sonographer:    Bernarda Rocks Referring Phys: SUNDIL, SUBRINA IMPRESSIONS  1. Left ventricular ejection fraction, by estimation, is 55 to 60%. The left ventricle has normal function. Left ventricular endocardial border not optimally defined to evaluate regional wall motion. There is mild concentric left ventricular hypertrophy. Left ventricular diastolic parameters are consistent with Grade I diastolic dysfunction (impaired relaxation).  2. Right ventricular systolic function is normal. The right ventricular size is normal.  3. The mitral valve is normal in structure. No evidence of mitral valve regurgitation.  4. The aortic valve is normal in structure. Aortic valve regurgitation is not visualized. FINDINGS  Left Ventricle: Left ventricular ejection fraction, by estimation, is 55 to 60%. The left ventricle has normal function. Left ventricular endocardial border not optimally defined to evaluate regional wall motion. The left ventricular internal cavity size was normal in size. There is mild concentric left ventricular hypertrophy. Left ventricular diastolic  parameters are consistent with Grade I diastolic dysfunction (impaired relaxation). Right Ventricle: The right ventricular size is normal. No increase in right ventricular wall thickness. Right ventricular systolic function is normal. Left Atrium: Left atrial size was normal in size. Right Atrium: Right atrial size was normal in size. Pericardium: There is no evidence of pericardial effusion. Mitral Valve: The mitral valve is normal in structure. No evidence of mitral valve regurgitation. MV peak gradient, 5.4 mmHg. The mean mitral valve gradient is 2.0 mmHg. Tricuspid Valve: The tricuspid valve is normal in structure. Tricuspid valve regurgitation is not demonstrated. Aortic Valve: The aortic valve is normal in structure. Aortic valve regurgitation is not visualized. Aortic valve mean gradient measures 3.0 mmHg. Aortic valve peak gradient measures 5.9 mmHg. Aortic valve area, by VTI measures 3.35 cm. Pulmonic Valve: The pulmonic valve was not well visualized. Pulmonic valve regurgitation is not visualized. Aorta: The aortic root is normal in size and structure. IAS/Shunts:  No atrial level shunt detected by color flow Doppler.  LEFT VENTRICLE PLAX 2D LVIDd:         5.90 cm      Diastology LVIDs:         3.90 cm      LV e' medial:    9.36 cm/s LV PW:         0.90 cm      LV E/e' medial:  7.5 LV IVS:        0.80 cm      LV e' lateral:   8.49 cm/s LVOT diam:     2.30 cm      LV E/e' lateral: 8.3 LV SV:         79 LV SV Index:   34 LVOT Area:     4.15 cm  LV Volumes (MOD) LV vol d, MOD A2C: 142.0 ml LV vol d, MOD A4C: 140.0 ml LV vol s, MOD A2C: 60.1 ml LV vol s, MOD A4C: 59.6 ml LV SV MOD A2C:     81.9 ml LV SV MOD A4C:     140.0 ml LV SV MOD BP:      80.7 ml RIGHT VENTRICLE             IVC RV Basal diam:  3.50 cm     IVC diam: 1.70 cm RV S prime:     16.30 cm/s TAPSE (M-mode): 2.6 cm LEFT ATRIUM             Index        RIGHT ATRIUM          Index LA diam:        3.70 cm 1.59 cm/m   RA Area:     9.82 cm LA Vol  (A2C):   49.1 ml 21.10 ml/m  RA Volume:   24.50 ml 10.53 ml/m LA Vol (A4C):   43.9 ml 18.86 ml/m LA Biplane Vol: 47.7 ml 20.50 ml/m  AORTIC VALVE                    PULMONIC VALVE AV Area (Vmax):    3.38 cm     PV Vmax:       0.92 m/s AV Area (Vmean):   2.84 cm     PV Peak grad:  3.4 mmHg AV Area (VTI):     3.35 cm AV Vmax:           121.00 cm/s AV Vmean:          83.300 cm/s AV VTI:            0.237 m AV Peak Grad:      5.9 mmHg AV Mean Grad:      3.0 mmHg LVOT Vmax:         98.30 cm/s LVOT Vmean:        57.000 cm/s LVOT VTI:          0.191 m LVOT/AV VTI ratio: 0.81  AORTA Ao Root diam: 3.30 cm Ao Asc diam:  3.20 cm MITRAL VALVE MV Area (PHT): 7.74 cm     SHUNTS MV Area VTI:   3.96 cm     Systemic VTI:  0.19 m MV Peak grad:  5.4 mmHg     Systemic Diam: 2.30 cm MV Mean grad:  2.0 mmHg MV Vmax:       1.16 m/s MV Vmean:      69.5 cm/s MV Decel Time: 98 msec MV E  velocity: 70.60 cm/s MV A velocity: 103.00 cm/s MV E/A ratio:  0.69 Aditya Sabharwal Electronically signed by Ria Commander Signature Date/Time: 12/31/2023/4:01:23 PM    Final    DG Chest 1 View Result Date: 12/30/2023 CLINICAL DATA:  Pain and vomiting. EXAM: CHEST  1 VIEW COMPARISON:  Chest x-ray 01/26/2021 FINDINGS: The heart size and mediastinal contours are within normal limits. Both lungs are clear. The visualized skeletal structures are unremarkable. IMPRESSION: No active disease. Electronically Signed   By: Greig Pique M.D.   On: 12/30/2023 22:55   CT ABDOMEN PELVIS W CONTRAST Result Date: 12/30/2023 EXAM: CT ABDOMEN AND PELVIS WITH CONTRAST 12/30/2023 09:26:06 PM TECHNIQUE: CT of the abdomen and pelvis was performed with the administration of intravenous contrast. Multiplanar reformatted images are provided for review. Automated exposure control, iterative reconstruction, and/or weight-based adjustment of the mA/kV was utilized to reduce the radiation dose to as low as reasonably achievable. COMPARISON: None available. CLINICAL  HISTORY: Bowel obstruction suspected. Pt reports vomiting x 6-7 days; sts he is unable to tolerate anything po; reports abd pain that he believes is from vomiting. FINDINGS: LOWER CHEST: No acute abnormality. LIVER: The liver is unremarkable. GALLBLADDER AND BILE DUCTS: Gallbladder is unremarkable. No biliary ductal dilatation. SPLEEN: No acute abnormality. PANCREAS: No acute abnormality. ADRENAL GLANDS: No acute abnormality. KIDNEYS, URETERS AND BLADDER: No stones in the kidneys or ureters. No hydronephrosis. No perinephric or periureteral stranding. Urinary bladder is unremarkable. GI AND BOWEL: Stomach demonstrates no acute abnormality. There is no bowel obstruction. PERITONEUM AND RETROPERITONEUM: No ascites. No free air. VASCULATURE: Mild aortoiliac atherosclerotic calcification. LYMPH NODES: No lymphadenopathy. REPRODUCTIVE ORGANS: No acute abnormality. BONES AND SOFT TISSUES: No acute osseous abnormality. No focal soft tissue abnormality. IMPRESSION: 1. No acute findings of bowel obstruction. 2. Mild aortoiliac atherosclerotic calcification. raf score: Aortic atherosclerosis (ICD10-I70.0) Electronically signed by: Dorethia Molt MD 12/30/2023 09:32 PM EDT RP Workstation: [id number]     Assessment and Plan:  # Elevated troponin, demand ischemia # Hypertensive crisis # Nausea vomiting - He presents with symptoms of nausea and vomiting for 1 week.  He cannot tolerate any oral intake.  He has not been able to take any blood pressure medications. - Admitted to the hospital with severe hypertension. - Troponins are minimally elevated and flat.  EKG is unchanged without acute changes. - He endorses no chest pain or pressure.  He is simply reporting nausea and vomiting. - Echocardiogram shows normal LV function with no wall motion abnormality. - This is not an acute coronary syndrome.  Troponin elevation is secondary to severe hypertension and demand ischemia.  Would recommend to stop heparin  drip. -  Okay to continue aspirin  and home Lipitor but this does not need further evaluation while in house.  He can consider an outpatient stress test but nothing needs to be done while in house.  Likely would be a good candidate for coronary CTA but this does not need to happen while he is here. - Regarding his hypertension he has not been able to tolerate his p.o. medications.  Would restart his carvedilol  12.5 mg twice daily hydralazine  25 mg 3 times daily, Imdur  60 mg daily, Entresto  97-103 mg twice daily as you are able. - He may just need as needed hydralazine  to treat his hypertension Cannot tolerate oral intake.  I will defer this to the hospital medicine team.  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  Will continue home GDMT as you  are able.  He is not volume overloaded.  Does not need IV diuresis.  - Would recommend to resume home carvedilol  12.5 mg twice daily, Jardiance  10 mg daily, Lasix  40 mg p.o. daily, Entresto  97-103 mg twice daily, hydralazine  25 mg 3 times daily, Imdur  60 mg daily. - He seems quite stable from a heart failure standpoint and does not need IV diuresis.  # Nausea/vomiting - Unclear what is causing this.  But nothing appears to be cardiac to explain this.  Would recommend further workup per the hospital team.  [facility] will sign off.   The patient is ready for discharge today from a cardiac standpoint. Medication Recommendations: Resume home medications as above. Other recommendations (labs, testing, etc): Further workup of nausea and vomiting per hospital team. Follow up as an outpatient: He may keep regular outpatient follow-up with his primary cardiologist.  This is not appear to be acute cardiac issues  For questions or updates, please contact [facility] Please consult [email or web address] for contact info under   Signed, Darryle T. Barbaraann, MD, [facility]  [facility]  [address] [place], [place] [address] [phone]  5:11 PM

## 2023-12-31 NOTE — H&P (Addendum)
 History and Physical    Patient: Terry Young FMW:[id number] DOB: November 10, 1970 DOA: 12/30/2023 DOS: the patient was seen and examined on 12/31/2023 PCP: [name], MD  Patient coming from: Home  Chief Complaint:  Chief Complaint  Patient presents with   Emesis   HPI: Terry Young is a 53 y.o. male with medical history significant of HFpEF, HTN and DM2 p/w hypertensive urgency.  The patient presented with vomiting that began last week. Initially, the patient attributed the symptoms to dietary causes and attempted self-care with over-the-counter medications and clear liquids. Despite these efforts, the patient's condition worsened, with an inability to retain solid foods and persistent symptoms of weakness and lightheadedness. Last night, the patient visited the emergency room, where tests suggested possible cardiac complications. The patient has a history of congestive heart failure, which was previously well-managed and asymptomatic after lifestyle changes and medication adherence. However, recent concerns about cardiac function prompted further evaluation. The patient did not report exposure to any sick individuals and has been compliant with dietary recommendations, including maintaining an A1C level below seven. The patient reported vomiting without diarrhea and noted constipation, with the last bowel movement occurring three to four days ago.  In OSH ED, pt  hypertensive. Labs notable for Cr 1.5, and tropnin 93-->94. OSH EDP started hep gtt given c/f ACS and requested medicine admission with cards eval.  Review of Systems: As mentioned in the history of present illness. All other systems reviewed and are negative. Past Medical History:  Diagnosis Date   CHF (congestive heart failure) (HCC)    Diabetes mellitus without complication (HCC)    Hypertension    History reviewed. No pertinent surgical history. Social History:  reports that he has never smoked. He has never used smokeless  tobacco. He reports that he does not drink alcohol and does not use drugs.  Allergies  Allergen Reactions   Lisinopril Cough    Family History  Problem Relation Age of Onset   Atrial fibrillation Neg Hx     Prior to Admission medications   Medication Sig Start Date End Date Taking? Authorizing Provider  acetaminophen  (TYLENOL ) 325 MG tablet Take 650 mg by mouth as needed.    [provider]  aspirin  81 MG EC tablet Take 1 tablet (81 mg total) by mouth daily. 01/31/21   Patsy Lenis, MD  atorvastatin  (LIPITOR) 40 MG tablet Take 1 tablet (40 mg total) by mouth daily. 10/30/23   Bensimhon, Toribio SAUNDERS, MD  carvedilol  (COREG ) 12.5 MG tablet Take 1 tablet (12.5 mg total) by mouth 2 (two) times daily with a meal. 10/30/23   Bensimhon, Toribio SAUNDERS, MD  empagliflozin  (JARDIANCE ) 10 MG TABS tablet Take 1 tablet (10 mg total) by mouth daily before breakfast. 10/30/23   Bensimhon, Toribio SAUNDERS, MD  furosemide  (LASIX ) 40 MG tablet Take 1 tablet (40 mg total) by mouth daily. 10/30/23   Bensimhon, Toribio SAUNDERS, MD  hydrALAZINE  (APRESOLINE ) 25 MG tablet Take 1 tablet (25 mg total) by mouth 3 (three) times daily. 10/30/23   Bensimhon, Toribio SAUNDERS, MD  Insulin  Pen Needle 32G X 4 MM MISC Use to inject insulin  as directed. 01/31/21   Patsy Lenis, MD  isosorbide  mononitrate (IMDUR ) 60 MG 24 hr tablet Take 1 tablet (60 mg total) by mouth daily. 10/30/23   Bensimhon, Toribio SAUNDERS, MD  metFORMIN  (GLUCOPHAGE ) 1000 MG tablet Take 1 tablet (1,000 mg total) by mouth 2 (two) times daily with a meal. Must have office visit for refills 08/16/23   Newlin,  Enobong, MD  Multiple Vitamins-Minerals (CENTRUM MEN) TABS Take 1 tablet by mouth daily.    [provider]  pregabalin  (LYRICA ) 50 MG capsule Take 1 capsule (50 mg total) by mouth 2 (two) times daily. 11/28/23   Newlin, Enobong, MD  sacubitril -valsartan  (ENTRESTO ) 97-103 MG Take 1 tablet by mouth 2 (two) times daily. Please call  office to schedule an appointment 10/17/23    Bensimhon, Toribio SAUNDERS, MD  Semaglutide , 2 MG/DOSE, 8 MG/3ML SOPN Inject 2 mg as directed once a week. 10/18/23   [name], MD    Physical Exam: Vitals:   12/31/23 1230 12/31/23 1300 12/31/23 1353 12/31/23 1403  BP:  (!) 155/100  (!) 179/105  Pulse: 95 94  93  Resp:  14  17  Temp:  98.5 F (36.9 C) 97.8 F (36.6 C)   TempSrc:  Oral Oral   SpO2: 99% 98%  100%  Weight:    117.2 kg  Height:   5' 10 (1.778 m) 5' 10 (1.778 m)   General: Alert, oriented x3, resting comfortably in no acute distress Respiratory: Lungs clear to auscultation bilaterally with normal respiratory effort; no w/r/r Cardiovascular: Regular rate and rhythm w/o m/r/g   Data Reviewed:  Lab Results  Component Value Date   WBC 6.5 12/31/2023   HGB 13.8 12/31/2023   HCT 41.0 12/31/2023   MCV 87.2 12/31/2023   PLT 259 12/31/2023   Lab Results  Component Value Date   GLUCOSE 130 (H) 12/30/2023   CALCIUM  9.6 12/30/2023   NA 139 12/30/2023   K 3.2 (L) 12/30/2023   CO2 23 12/30/2023   CL 96 (L) 12/30/2023   BUN 16 12/30/2023   CREATININE 1.52 (H) 12/30/2023   Lab Results  Component Value Date   ALT 13 12/30/2023   AST 16 12/30/2023   ALKPHOS 59 12/30/2023   BILITOT 1.0 12/30/2023   No results found for: INR, PROTIME Radiology: DG Chest 1 View Result Date: 12/30/2023 CLINICAL DATA:  Pain and vomiting. EXAM: CHEST  1 VIEW COMPARISON:  Chest x-ray 01/26/2021 FINDINGS: The heart size and mediastinal contours are within normal limits. Both lungs are clear. The visualized skeletal structures are unremarkable. IMPRESSION: No active disease. Electronically Signed   By: Greig Pique M.D.   On: 12/30/2023 22:55   CT ABDOMEN PELVIS W CONTRAST Result Date: 12/30/2023 EXAM: CT ABDOMEN AND PELVIS WITH CONTRAST 12/30/2023 09:26:06 PM TECHNIQUE: CT of the abdomen and pelvis was performed with the administration of intravenous contrast. Multiplanar reformatted images are provided for review. Automated exposure  control, iterative reconstruction, and/or weight-based adjustment of the mA/kV was utilized to reduce the radiation dose to as low as reasonably achievable. COMPARISON: None available. CLINICAL HISTORY: Bowel obstruction suspected. Pt reports vomiting x 6-7 days; sts he is unable to tolerate anything po; reports abd pain that he believes is from vomiting. FINDINGS: LOWER CHEST: No acute abnormality. LIVER: The liver is unremarkable. GALLBLADDER AND BILE DUCTS: Gallbladder is unremarkable. No biliary ductal dilatation. SPLEEN: No acute abnormality. PANCREAS: No acute abnormality. ADRENAL GLANDS: No acute abnormality. KIDNEYS, URETERS AND BLADDER: No stones in the kidneys or ureters. No hydronephrosis. No perinephric or periureteral stranding. Urinary bladder is unremarkable. GI AND BOWEL: Stomach demonstrates no acute abnormality. There is no bowel obstruction. PERITONEUM AND RETROPERITONEUM: No ascites. No free air. VASCULATURE: Mild aortoiliac atherosclerotic calcification. LYMPH NODES: No lymphadenopathy. REPRODUCTIVE ORGANS: No acute abnormality. BONES AND SOFT TISSUES: No acute osseous abnormality. No focal soft tissue abnormality. IMPRESSION: 1. No acute findings  of bowel obstruction. 2. Mild aortoiliac atherosclerotic calcification. raf score: Aortic atherosclerosis (ICD10-I70.0) Electronically signed by: Dorethia Molt MD 12/30/2023 09:32 PM EDT RP Workstation: [id number]    Assessment and Plan: 22M h/o HFpEF, HTN and DM2 p/w hypertensive urgency.  Hypertensive urgency  -PTA Coreg , Entresto , and Imdur  -IV lasix  40mg  BID for now; goal net neg 1-2L/d; strict I/Os; daily standing weights; K>4/Mg>2  NSTEMI -Cards consulted per EDP; apprec eval/recs -Continue hep gtt for now; awaiting Cards eval  N/V Presumably related to constipation; pt reports no BM for 3>3 days -Senna docusate BID and miralax  BID  DM2 -HOLD pta jardiance , metformin  and semaglutide  -SSI TID AC prn   Advance Care Planning:    Code Status: Full Code    Consults: Cards  Family Communication: Sister  Severity of Illness: The appropriate patient status for this patient is INPATIENT. Inpatient status is judged to be reasonable and necessary in order to provide the required intensity of service to ensure the patient's safety. The patient's presenting symptoms, physical exam findings, and initial radiographic and laboratory data in the context of their chronic comorbidities is felt to place them at high risk for further clinical deterioration. Furthermore, it is not anticipated that the patient will be medically stable for discharge from the hospital within 2 midnights of admission.   * I certify that at the point of admission it is my clinical judgment that the patient will require inpatient hospital care spanning beyond 2 midnights from the point of admission due to high intensity of service, high risk for further deterioration and high frequency of surveillance required.*   ------- I spent 55 minutes reviewing previous notes, at the bedside counseling/discussing the treatment plan, and performing clinical documentation.  Author: Marsha Ada, MD 12/31/2023 3:28 PM  For on call review [email or web address].

## 2023-12-31 NOTE — Plan of Care (Signed)
   Problem: Education: Goal: Ability to describe self-care measures that may prevent or decrease complications (Diabetes Survival Skills Education) will improve Outcome: Progressing Goal: Individualized Educational Video(s) Outcome: Progressing   Problem: Coping: Goal: Ability to adjust to condition or change in health will improve Outcome: Progressing   Problem: Fluid Volume: Goal: Ability to maintain a balanced intake and output will improve Outcome: Progressing   Problem: Health Behavior/Discharge Planning: Goal: Ability to identify and utilize available resources and services will improve Outcome: Progressing Goal: Ability to manage health-related needs will improve Outcome: Progressing   Problem: Metabolic: Goal: Ability to maintain appropriate glucose levels will improve Outcome: Progressing   Problem: Nutritional: Goal: Maintenance of adequate nutrition will improve Outcome: Progressing Goal: Progress toward achieving an optimal weight will improve Outcome: Progressing   Problem: Skin Integrity: Goal: Risk for impaired skin integrity will decrease Outcome: Progressing   Problem: Tissue Perfusion: Goal: Adequacy of tissue perfusion will improve Outcome: Progressing   Problem: Education: Goal: Knowledge of General Education information will improve Description: Including pain rating scale, medication(s)/side effects and non-pharmacologic comfort measures Outcome: Progressing   Problem: Health Behavior/Discharge Planning: Goal: Ability to manage health-related needs will improve Outcome: Progressing   Problem: Clinical Measurements: Goal: Ability to maintain clinical measurements within normal limits will improve Outcome: Progressing Goal: Will remain free from infection Outcome: Progressing Goal: Diagnostic test results will improve Outcome: Progressing Goal: Respiratory complications will improve Outcome: Progressing Goal: Cardiovascular complication will  be avoided Outcome: Progressing   Problem: Activity: Goal: Risk for activity intolerance will decrease Outcome: Progressing

## 2023-12-31 NOTE — Progress Notes (Signed)
 PHARMACY - ANTICOAGULATION CONSULT NOTE  Pharmacy Consult for heparin  Indication: chest pain/ACS  Allergies  Allergen Reactions   Lisinopril Cough    Patient Measurements: Height: 5' 10 (177.8 cm) Weight: 114.3 kg (252 lb) IBW/kg (Calculated) : 73 HEPARIN  DW (KG): 98.2  Vital Signs: Temp: 98.5 F (36.9 C) (09/15 0407) Temp Source: Oral (09/15 0407) BP: 129/103 (09/15 0600) Pulse Rate: 89 (09/15 0645)  Labs: Recent Labs    12/30/23 1922 12/30/23 2124 12/31/23 0550  HGB 13.9 15.0 13.8  HCT 40.8 44.0 41.0  PLT 298  --  259  HEPARINUNFRC  --   --  0.86*  CREATININE 1.52*  --   --     Estimated Creatinine Clearance: 73.6 mL/min (A) (by C-G formula based on SCr of 1.52 mg/dL (H)).   Medical History: Past Medical History:  Diagnosis Date   CHF (congestive heart failure) (HCC)    Diabetes mellitus without complication (HCC)    Hypertension    Assessment: 21 yoM presented to ED with abdominal pain/vomiting for last week. Pharmacy consulted to dose heparin  for ACS.  Heparin  level supratherapeutic (0.86) on infusion at 1100 units/hr. No bleeding noted.  Goal of Therapy:  Heparin  level 0.3-0.7 units/ml Monitor platelets by anticoagulation protocol: Yes   Plan:  Decrease heparin  to 950 units/hr Will f/u 6 hr heparin  level F/u cardiology consult   Vito Ralph, PharmD, BCPS Please see amion for complete clinical pharmacist phone list 12/31/2023 7:36 AM

## 2024-01-01 DIAGNOSIS — I5A Non-ischemic myocardial injury (non-traumatic): Secondary | ICD-10-CM | POA: Diagnosis not present

## 2024-01-01 DIAGNOSIS — R112 Nausea with vomiting, unspecified: Secondary | ICD-10-CM

## 2024-01-01 LAB — CBC
HCT: 38 % — ABNORMAL LOW (ref 39.0–52.0)
Hemoglobin: 13 g/dL (ref 13.0–17.0)
MCH: 29.7 pg (ref 26.0–34.0)
MCHC: 34.2 g/dL (ref 30.0–36.0)
MCV: 87 fL (ref 80.0–100.0)
Platelets: 225 K/uL (ref 150–400)
RBC: 4.37 MIL/uL (ref 4.22–5.81)
RDW: 13.8 % (ref 11.5–15.5)
WBC: 5.9 K/uL (ref 4.0–10.5)
nRBC: 0 % (ref 0.0–0.2)

## 2024-01-01 LAB — MAGNESIUM: Magnesium: 1.7 mg/dL (ref 1.7–2.4)

## 2024-01-01 LAB — GLUCOSE, CAPILLARY
Glucose-Capillary: 93 mg/dL (ref 70–99)
Glucose-Capillary: 89 mg/dL (ref 70–99)
Glucose-Capillary: 114 mg/dL — ABNORMAL HIGH (ref 70–99)
Glucose-Capillary: 114 mg/dL — ABNORMAL HIGH (ref 70–99)

## 2024-01-01 LAB — RENAL FUNCTION PANEL
Albumin: 3.5 g/dL (ref 3.5–5.0)
Anion gap: 19 — ABNORMAL HIGH (ref 5–15)
BUN: 17 mg/dL (ref 6–20)
CO2: 20 mmol/L — ABNORMAL LOW (ref 22–32)
Calcium: 8.9 mg/dL (ref 8.9–10.3)
Chloride: 99 mmol/L (ref 98–111)
Creatinine, Ser: 1.5 mg/dL — ABNORMAL HIGH (ref 0.61–1.24)
GFR, Estimated: 56 mL/min — ABNORMAL LOW (ref >60)
Glucose, Bld: 104 mg/dL — ABNORMAL HIGH (ref 70–99)
Phosphorus: 3.8 mg/dL (ref 2.5–4.6)
Potassium: 3 mmol/L — ABNORMAL LOW (ref 3.5–5.1)
Sodium: 138 mmol/L (ref 135–145)

## 2024-01-01 LAB — HEPARIN LEVEL (UNFRACTIONATED): Heparin Unfractionated: <0.1 [IU]/mL — ABNORMAL LOW (ref 0.30–0.70)

## 2024-01-01 MED ORDER — MAGNESIUM SULFATE 4 GM/100ML IV SOLN
4.0000 g | Freq: Once | INTRAVENOUS | Status: AC
  Administered 2024-01-01: 4 g via INTRAVENOUS
  Filled 2024-01-01: qty 100

## 2024-01-01 MED ORDER — METOCLOPRAMIDE HCL 5 MG/ML IJ SOLN: 5.0000 mg | Freq: Four times a day (QID) | INTRAMUSCULAR | Status: DC | PRN

## 2024-01-01 MED ORDER — POTASSIUM CHLORIDE CRYS ER 20 MEQ PO TBCR
60.0000 meq | EXTENDED_RELEASE_TABLET | ORAL | Status: AC
  Administered 2024-01-01 (×2): 60 meq via ORAL
  Filled 2024-01-01 (×2): qty 3

## 2024-01-01 MED ORDER — ENOXAPARIN SODIUM 40 MG/0.4ML IJ SOSY
40.0000 mg | PREFILLED_SYRINGE | INTRAMUSCULAR | Status: DC
  Administered 2024-01-01: 40 mg via SUBCUTANEOUS
  Filled 2024-01-01: qty 0.4

## 2024-01-01 NOTE — Progress Notes (Signed)
 PROGRESS NOTE  Terry Young FMW:[id number] DOB: [birth year]   PCP: [name], MD  Patient is from: Home.  DOA: 12/30/2023 LOS: 1  Chief complaints Chief Complaint  Patient presents with   Emesis     Brief Narrative / Interim history: 54 year old M with PMH of HFpEF, DM-2, HTN and obesity presenting with nausea, vomiting, generalized weakness and lightheadedness for about 1 week, and admitted with intractable nausea and vomiting, hypertensive urgency and elevated troponin.   In ED, slightly elevated BP but trended up to 179/105.  Cr 1.5.  Glucose 130.  Otherwise, CMP and CBC without significant finding.  Tropnin 93-->94.  Started on heparin  and admitted for further care.  Cardiology consulted.  ACS felt to be less likely.  Elevated troponin felt to be demand ischemia in the setting of uncontrolled hypertension.  Heparin  discontinued.  TTE with normal LVEF and G1 DD.  Subjective: Seen and examined earlier this morning.  No major events overnight or this morning.  Tolerated some soft diet this morning.  No vomiting but some nausea and poor appetite.  Had a bowel movement this morning.  Denies chest pain or shortness of breath.  Sister at bedside.  Objective: Vitals:   01/01/24 0457 01/01/24 0900 01/01/24 1145 01/01/24 1545  BP:  (!) 154/85 (!) 147/91 105/76  Pulse: 92 90 92 99  Resp:  17 16 20   Temp:  98 F (36.7 C) 97.9 F (36.6 C) 98 F (36.7 C)  TempSrc:  Oral Oral Oral  SpO2:  94% 95% 98%  Weight:      Height:        Examination:  GENERAL: No apparent distress.  Nontoxic. HEENT: MMM.  Vision and hearing grossly intact.  NECK: Supple.  No apparent JVD.  RESP:  No IWOB.  Fair aeration bilaterally. CVS:  RRR. Heart sounds normal.  ABD/GI/GU: BS+. Abd soft, NTND.  MSK/EXT:  Moves extremities. No apparent deformity. No edema.  SKIN: no apparent skin lesion or wound NEURO: AA.  Oriented appropriately.  No apparent focal neuro deficit. PSYCH: Calm. Normal affect.    Consultants:  Cardiology  Procedures: None  Microbiology summarized: A-20 pathogen RVP nonreactive  Assessment and plan: Hypertensive urgency: Likely due to not taking his medication in the setting of intractable nausea and vomiting.  TTE reassuring.  Improved -Continue home Coreg , Entresto , Jardiance , Imdur  and p.o. Lasix . - Antiemetics as needed  Myocardial injury/demand ischemia/elevated troponin: No chest pain.  TTE reassuring. - Cardiac meds as above -Continue aspirin  and Lipitor   Intractable nausea and vomiting: Abdominal exam benign.  CT abdomen and pelvis without acute finding. CMP and lipase without significant finding.  Low suspicion for infection.  Could be gastroparesis or due to Ozempic .  Improved. -Change Zofran  to Reglan  as needed -Continue holding Ozempic .   NIDDM-2 with hyperglycemia: A1c 6.8%.  On Jardiance , Ozempic  and metformin  outpatient. Recent Labs  Lab 12/31/23 2116 12/31/23 2210 01/01/24 0654 01/01/24 1142 01/01/24 1546  GLUCAP 118* 96 93 89 114*  - Continue home Jardiance  - Decrease SSI to sensitive scale - Continue Lantus  10 units daily  CKD-3A: Stable -Continue monitoring  Hypokalemia -Monitor replenish K and Mg as appropriate  Hyponatremia: Mild - Continue monitoring  Morbid obesity: Elevated BMI with diabetes and other comorbidity as above Body mass index is 36.69 kg/m.          DVT prophylaxis:  enoxaparin  (LOVENOX ) injection 40 mg Start: 01/01/24 1745  Code Status: Full code Family Communication: Updated patient's sister at bedside Level  of care: Telemetry Cardiac Status is: Observation The patient will require care spanning > 2 midnights and should be moved to inpatient because: Intractable nausea and vomiting   Final disposition: Home   55 minutes with more than 50% spent in reviewing records, counseling patient/family and coordinating care.   Sch Meds:  Scheduled Meds:  aspirin  EC  81 mg Oral Daily    atorvastatin   40 mg Oral Daily   carvedilol   12.5 mg Oral BID WC   empagliflozin   10 mg Oral QAC breakfast   enoxaparin  (LOVENOX ) injection  40 mg Subcutaneous Q24H   furosemide   40 mg Oral Daily   insulin  aspart  0-15 Units Subcutaneous TID WC   insulin  glargine  10 Units Subcutaneous Daily   isosorbide  mononitrate  60 mg Oral Daily   polyethylene glycol  17 g Oral BID   pregabalin   50 mg Oral BID   sacubitril -valsartan   1 tablet Oral BID   senna-docusate  2 tablet Oral BID   Continuous Infusions: PRN Meds:.acetaminophen , melatonin, metoCLOPramide  (REGLAN ) injection, mouth rinse  Antimicrobials: Anti-infectives (From admission, onward)    None        I have personally reviewed the following labs and images: CBC: Recent Labs  Lab 12/30/23 1922 12/30/23 2124 12/31/23 0550 01/01/24 0319  WBC 7.6  --  6.5 5.9  HGB 13.9 15.0 13.8 13.0  HCT 40.8 44.0 41.0 38.0*  MCV 86.8  --  87.2 87.0  PLT 298  --  259 225   BMP &GFR Recent Labs  Lab 12/30/23 1922 12/30/23 2124 01/01/24 0934  NA 139 139 138  K 3.5 3.2* 3.0*  CL 96*  --  99  CO2 23  --  20*  GLUCOSE 130*  --  104*  BUN 16  --  17  CREATININE 1.52*  --  1.50*  CALCIUM  9.6  --  8.9  MG  --   --  1.7  PHOS  --   --  3.8   Estimated Creatinine Clearance: 75.2 mL/min (A) (by C-G formula based on SCr of 1.5 mg/dL (H)). Liver & Pancreas: Recent Labs  Lab 12/30/23 1922 01/01/24 0934  AST 16  --   ALT 13  --   ALKPHOS 59  --   BILITOT 1.0  --   PROT 8.0  --   ALBUMIN  4.6 3.5   Recent Labs  Lab 12/30/23 1922  LIPASE 31   No results for input(s): AMMONIA in the last 168 hours. Diabetic: No results for input(s): HGBA1C in the last 72 hours. Recent Labs  Lab 12/31/23 2116 12/31/23 2210 01/01/24 0654 01/01/24 1142 01/01/24 1546  GLUCAP 118* 96 93 89 114*   Cardiac Enzymes: No results for input(s): CKTOTAL, CKMB, CKMBINDEX, TROPONINI in the last 168 hours. Recent Labs    08/16/23 1046   PROBNP 99   Coagulation Profile: No results for input(s): INR, PROTIME in the last 168 hours. Thyroid Function Tests: No results for input(s): TSH, T4TOTAL, FREET4, T3FREE, THYROIDAB in the last 72 hours. Lipid Profile: No results for input(s): CHOL, HDL, LDLCALC, TRIG, CHOLHDL, LDLDIRECT in the last 72 hours. Anemia Panel: No results for input(s): VITAMINB12, FOLATE, FERRITIN, TIBC, IRON, RETICCTPCT in the last 72 hours. Urine analysis:    Component Value Date/Time   COLORURINE YELLOW 12/30/2023 1922   APPEARANCEUR CLEAR 12/30/2023 1922   LABSPEC 1.010 12/30/2023 1922   PHURINE 5.5 12/30/2023 1922   GLUCOSEU >=500 (A) 12/30/2023 1922   HGBUR NEGATIVE 12/30/2023 1922  BILIRUBINUR LARGE (A) 12/30/2023 1922   KETONESUR 40 (A) 12/30/2023 1922   PROTEINUR 30 (A) 12/30/2023 1922   NITRITE NEGATIVE 12/30/2023 1922   LEUKOCYTESUR NEGATIVE 12/30/2023 1922   Sepsis Labs: Invalid input(s): PROCALCITONIN, LACTICIDVEN  Microbiology: Recent Results (from the past 240 hours)  Respiratory (~20 pathogens) panel by PCR     Status: None   Collection Time: 12/31/23  5:30 PM   Specimen: Nasopharyngeal Swab; Respiratory  Result Value Ref Range Status   Adenovirus NOT DETECTED NOT DETECTED Final   Coronavirus 229E NOT DETECTED NOT DETECTED Final    Comment: (NOTE) The Coronavirus on the Respiratory Panel, DOES NOT test for the novel  Coronavirus (2019 nCoV)    Coronavirus HKU1 NOT DETECTED NOT DETECTED Final   Coronavirus NL63 NOT DETECTED NOT DETECTED Final   Coronavirus OC43 NOT DETECTED NOT DETECTED Final   Metapneumovirus NOT DETECTED NOT DETECTED Final   Rhinovirus / Enterovirus NOT DETECTED NOT DETECTED Final   Influenza A NOT DETECTED NOT DETECTED Final   Influenza B NOT DETECTED NOT DETECTED Final   Parainfluenza Virus 1 NOT DETECTED NOT DETECTED Final   Parainfluenza Virus 2 NOT DETECTED NOT DETECTED Final   Parainfluenza Virus 3 NOT  DETECTED NOT DETECTED Final   Parainfluenza Virus 4 NOT DETECTED NOT DETECTED Final   Respiratory Syncytial Virus NOT DETECTED NOT DETECTED Final   Bordetella pertussis NOT DETECTED NOT DETECTED Final   Bordetella Parapertussis NOT DETECTED NOT DETECTED Final   Chlamydophila pneumoniae NOT DETECTED NOT DETECTED Final   Mycoplasma pneumoniae NOT DETECTED NOT DETECTED Final    Comment: Performed at [facility], [address] [place], [place] [address]    Radiology Studies: No results found.    Kyndra Condron T. Shayann Garbutt [facility]  If 7PM-7AM, please contact night-coverage [email or web address] 01/01/2024, 4:53 PM

## 2024-01-01 NOTE — Plan of Care (Signed)
   Problem: Education: Goal: Knowledge of General Education information will improve Description: Including pain rating scale, medication(s)/side effects and non-pharmacologic comfort measures Outcome: Progressing   Problem: Clinical Measurements: Goal: Cardiovascular complication will be avoided Outcome: Progressing

## 2024-01-01 NOTE — Progress Notes (Signed)
 Mobility Specialist Progress Note:    01/01/24 1517  Mobility  Activity Ambulated with assistance;Dangled on edge of bed (ambulation in room, heel raises, leg ext, leg curls)  Level of Assistance Standby assist, set-up cues, supervision of patient - no hands on  Assistive Device None  Distance Ambulated (ft) 25 ft  Activity Response Tolerated well  Mobility Referral Yes  Mobility visit 1 Mobility  Mobility Specialist Start Time (ACUTE ONLY) 1517  Mobility Specialist Stop Time (ACUTE ONLY) 1524  Mobility Specialist Time Calculation (min) (ACUTE ONLY) 7 min   Received pt sitting on EOB pleasant and eager for session. Pt c/o legs feeling a little wobbly but otherwise feeling a lot better than the previous day. Pt able to stand, move and ambulate w/o any assist. Returned pt to EOB where he asked and did a few of the above exercises 10 times each leg. Left pt on EOB w/ all needs met.   Venetia Keel Mobility Specialist Please Neurosurgeon or Rehab Office at [phone]

## 2024-01-01 NOTE — Plan of Care (Signed)
  Problem: Coping: Goal: Ability to adjust to condition or change in health will improve Outcome: Progressing   Problem: Fluid Volume: Goal: Ability to maintain a balanced intake and output will improve Outcome: Progressing   Problem: Metabolic: Goal: Ability to maintain appropriate glucose levels will improve Outcome: Progressing   Problem: Clinical Measurements: Goal: Ability to maintain clinical measurements within normal limits will improve Outcome: Progressing

## 2024-01-01 NOTE — Plan of Care (Signed)
  Patient's blood pressure is soft and reporting that he is feeling very weak and tired.  Currently on IV Lasix  40 mg twice daily, Coreg  twice daily, Imdur  and Entresto . -In the setting of low blood pressure holding Entresto  for tonight.  Giving albumin  25 g. -Checking BMP to follow-up with electrolytes.

## 2024-01-01 NOTE — TOC CM/SW Note (Signed)
 Transition of Care [facility]) - Inpatient Brief Assessment   Patient Details  Name: Terry Young MRN: [id number] Date of Birth: 1970/08/14  Transition of Care [facility]) CM/SW Contact:    Terry Barnie Rama, RN Phone Number: 01/01/2024, 3:19 PM   Clinical Narrative: From home with sister, indep, has PCP and insurance on file, states has no HH services in place at this time or DME at home.  States family member (sister) will transport them home at dc and she is support system, states gets medications from [facility].   Pta self ambulatory.   There are no ICM ( Inpatient Case Management ) needs identified  at this time.  Please place consult for any ICM (Inpatient Case Management)  needs.     Transition of Care Asessment: Insurance and Status: Insurance coverage has been reviewed Patient has primary care physician: Yes Home environment has been reviewed: home with sister Prior level of function:: indep Prior/Current Home Services: No current home services Social Drivers of Health Review: SDOH reviewed no interventions necessary Readmission risk has been reviewed: Yes Transition of care needs: no transition of care needs at this time

## 2024-01-01 NOTE — Progress Notes (Signed)
 Provider on call notified of lower BP.  Pt does endorse feeling a little weak.  Will hold Entresto  tonight and await any further orders.    01/01/24 2158  Vitals  BP 93/78  MAP (mmHg) 84  BP Location Right Arm  BP Method Automatic  Patient Position (if appropriate) Sitting  Pulse Rate 94  Pulse Rate Source Monitor  ECG Heart Rate 94  Resp 18  Level of Consciousness  Level of Consciousness Alert  MEWS COLOR  MEWS Score Color Green  Oxygen Therapy  SpO2 97 %  O2 Device Room Air  Pain Assessment  Pain Scale 0-10  Pain Score 2  Pain Location Generalized  Pain Intervention(s) Medication (See eMAR)  PCA/Epidural/Spinal Assessment  Respiratory Pattern Regular;Unlabored;Symmetrical  Glasgow Coma Scale  Eye Opening 4  Best Verbal Response (NON-intubated) 5  Best Motor Response 6  Glasgow Coma Scale Score 15  MEWS Score  MEWS Temp 0  MEWS Systolic 1  MEWS Pulse 0  MEWS RR 0  MEWS LOC 0  MEWS Score 1   Glade Lee BSN RN [facility] 01/01/2024, 10:16 PM

## 2024-01-02 DIAGNOSIS — R112 Nausea with vomiting, unspecified: Secondary | ICD-10-CM | POA: Diagnosis not present

## 2024-01-02 DIAGNOSIS — I160 Hypertensive urgency: Secondary | ICD-10-CM | POA: Diagnosis not present

## 2024-01-02 DIAGNOSIS — E1165 Type 2 diabetes mellitus with hyperglycemia: Secondary | ICD-10-CM | POA: Diagnosis not present

## 2024-01-02 DIAGNOSIS — Z794 Long term (current) use of insulin: Secondary | ICD-10-CM

## 2024-01-02 LAB — RENAL FUNCTION PANEL
Albumin: 3.7 g/dL (ref 3.5–5.0)
Anion gap: 16 — ABNORMAL HIGH (ref 5–15)
BUN: 18 mg/dL (ref 6–20)
CO2: 22 mmol/L (ref 22–32)
Calcium: 8.9 mg/dL (ref 8.9–10.3)
Chloride: 100 mmol/L (ref 98–111)
Creatinine, Ser: 1.67 mg/dL — ABNORMAL HIGH (ref 0.61–1.24)
GFR, Estimated: 50 mL/min — ABNORMAL LOW (ref >60)
Glucose, Bld: 104 mg/dL — ABNORMAL HIGH (ref 70–99)
Phosphorus: 3.8 mg/dL (ref 2.5–4.6)
Potassium: 3.6 mmol/L (ref 3.5–5.1)
Sodium: 138 mmol/L (ref 135–145)

## 2024-01-02 LAB — BASIC METABOLIC PANEL WITH GFR
Anion gap: 15 (ref 5–15)
BUN: 18 mg/dL (ref 6–20)
CO2: 23 mmol/L (ref 22–32)
Calcium: 8.9 mg/dL (ref 8.9–10.3)
Chloride: 99 mmol/L (ref 98–111)
Creatinine, Ser: 1.64 mg/dL — ABNORMAL HIGH (ref 0.61–1.24)
GFR, Estimated: 51 mL/min — ABNORMAL LOW (ref >60)
Glucose, Bld: 103 mg/dL — ABNORMAL HIGH (ref 70–99)
Potassium: 3.5 mmol/L (ref 3.5–5.1)
Sodium: 137 mmol/L (ref 135–145)

## 2024-01-02 LAB — CBC
HCT: 39.6 % (ref 39.0–52.0)
Hemoglobin: 13.2 g/dL (ref 13.0–17.0)
MCH: 29.1 pg (ref 26.0–34.0)
MCHC: 33.3 g/dL (ref 30.0–36.0)
MCV: 87.4 fL (ref 80.0–100.0)
Platelets: 259 K/uL (ref 150–400)
RBC: 4.53 MIL/uL (ref 4.22–5.81)
RDW: 13.8 % (ref 11.5–15.5)
WBC: 6 K/uL (ref 4.0–10.5)
nRBC: 0 % (ref 0.0–0.2)

## 2024-01-02 LAB — GLUCOSE, CAPILLARY: Glucose-Capillary: 97 mg/dL (ref 70–99)

## 2024-01-02 LAB — MAGNESIUM: Magnesium: 2.7 mg/dL — ABNORMAL HIGH (ref 1.7–2.4)

## 2024-01-02 MED ORDER — ALBUMIN HUMAN 25 % IV SOLN
25.0000 g | INTRAVENOUS | Status: AC
  Administered 2024-01-02: 25 g via INTRAVENOUS
  Filled 2024-01-02: qty 100

## 2024-01-02 NOTE — Evaluation (Signed)
 Occupational Therapy Evaluation & Discharge Patient Details Name: Terry Young MRN: [id number] DOB: February 18, 1971 Today's Date: 01/02/2024   History of Present Illness   Pt is a 53 y.o. male admitted 9/14 for n/v. Imaging showed no acute bowel obstruction. PMH: CHF, DM, CKD, HLD, morbid obesity     Clinical Impressions Pt admitted based on above, and was seen based on problem list below. PTA pt was independent with ADLs and IADLs. Today pt is at mod I for ADLs and functional mobility with no AD. Pt able to navigate toilet transfers, complete standing ADLs, and negotiate 12 stairs with use of bil hand rail and step-to pattern. Pt mildly slower than baseline, but will improve when nutritional intake improves. No follow up OT or DME needs. All education complete, no further acute needs identified, OT is signing off on this pt.        If plan is discharge home, recommend the following:   Assistance with cooking/housework     Functional Status Assessment   Patient has not had a recent decline in their functional status     Equipment Recommendations   None recommended by OT      Precautions/Restrictions   Precautions Precautions: None Restrictions Weight Bearing Restrictions Per Provider Order: No     Mobility Bed Mobility   General bed mobility comments: Received sitting EOB    Transfers Overall transfer level: Modified independent Equipment used: None   General transfer comment: No AD, mildly slower than baseline but no assist      Balance Overall balance assessment: No apparent balance deficits (not formally assessed)       ADL either performed or assessed with clinical judgement   ADL Overall ADL's : Modified independent       General ADL Comments: Pt slower than baseline but able to complete tasks with increased time     Vision Baseline Vision/History: 1 Wears glasses Patient Visual Report: No change from baseline Vision Assessment?: No apparent  visual deficits            Pertinent Vitals/Pain Pain Assessment Pain Assessment: No/denies pain     Extremity/Trunk Assessment Upper Extremity Assessment Upper Extremity Assessment: Overall WFL for tasks assessed   Lower Extremity Assessment Lower Extremity Assessment: Overall WFL for tasks assessed   Cervical / Trunk Assessment Cervical / Trunk Assessment: Normal   Communication Communication Communication: No apparent difficulties   Cognition Arousal: Alert Behavior During Therapy: WFL for tasks assessed/performed Cognition: No apparent impairments       Following commands: Intact       Cueing  General Comments   Cueing Techniques: Verbal cues  VSS on RA           Home Living Family/patient expects to be discharged to:: Private residence Living Arrangements: Alone Available Help at Discharge: Family Type of Home: Apartment Home Access: Stairs to enter Secretary/administrator of Steps: Programmer, applications: Can reach both Home Layout: One level     Bathroom Shower/Tub: Chief Strategy Officer: Standard Bathroom Accessibility: Yes How Accessible: Accessible via walker Home Equipment: None;Grab bars - tub/shower          Prior Functioning/Environment Prior Level of Function : Independent/Modified Independent;Driving             Mobility Comments: No AD      OT Problem List: Pain        OT Goals(Current goals can be found in the care plan section)   Acute Rehab OT Goals Patient  Stated Goal: To go home OT Goal Formulation: All assessment and education complete, DC therapy Time For Goal Achievement: 01/16/24 Potential to Achieve Goals: Good   AM-PAC OT 6 Clicks Daily Activity     Outcome Measure Help from another person eating meals?: None Help from another person taking care of personal grooming?: None Help from another person toileting, which includes using toliet, bedpan, or urinal?: None Help from another  person bathing (including washing, rinsing, drying)?: None Help from another person to put on and taking off regular upper body clothing?: None Help from another person to put on and taking off regular lower body clothing?: None 6 Click Score: 24   End of Session Nurse Communication: Mobility status  Activity Tolerance: Patient tolerated treatment well Patient left: in bed;with call bell/phone within reach  OT Visit Diagnosis: Other abnormalities of gait and mobility (R26.89)                Time: 1212-1224 OT Time Calculation (min): 12 min Charges:  OT General Charges $OT Visit: 1 Visit OT Evaluation $OT Eval Low Complexity: 1 Low  Adrianne BROCKS, OT  Acute Rehabilitation Services Office [phone] Secure chat preferred   Adrianne GORMAN Savers 01/02/2024, 12:34 PM

## 2024-01-02 NOTE — Discharge Summary (Signed)
 Physician Discharge Summary  Terry Young FMW:[id number] DOB: [birth year] DOA: 12/30/2023  PCP: Terry Clam, MD  Admit date: 12/30/2023 Discharge date: 01/02/24  Admitted From: Home Disposition: Home Recommendations for Outpatient Follow-up:  Outpatient follow-up with PCP in 1 to 2 weeks Encouraged to follow-up with cardiology as soon as possible Encouraged to hold Ozempic  due to poor appetite, nausea, vomiting.  Reassess need at follow-up Please follow up on the following pending results: None  Home Health: No need identified Equipment/Devices: No need identified  Discharge Condition: Stable CODE STATUS: Full code Diet Orders (From admission, onward)     Start     Ordered   01/02/24 0000  Diet - low sodium heart healthy        01/02/24 1203   12/31/23 1535  Diet Carb Modified Fluid consistency: Thin; Room service appropriate? Yes  Diet effective now       Question Answer Comment  Diet-HS Snack? Nothing   Calorie Level Medium 1600-2000   Fluid consistency: Thin   Room service appropriate? Yes      12/31/23 1534             Follow-up Information     Terry Clam, MD. Schedule an appointment as soon as possible for a visit in 1 week(s).   Specialty: Family Medicine Contact information: [address] [place] [address] [place] [address] [phone]                 Hospital course 53 year old M with PMH of HFpEF, DM-2, HTN and obesity presenting with nausea, vomiting, generalized weakness and lightheadedness for about 1 week, and admitted with intractable nausea and vomiting, hypertensive urgency and elevated troponin.  Of note, patient has been uptitrating his Ozempic  and has been on max dose since last month.   In ED, slightly elevated BP but trended up to 179/105.  Cr 1.5.  Glucose 130.  Otherwise, CMP and CBC without significant finding.  Tropnin 93-->94.  Started on heparin  and admitted for further care.  Cardiology consulted.   ACS felt to be  less likely.  Elevated troponin felt to be demand ischemia in the setting of uncontrolled hypertension.  Heparin  discontinued.  TTE with normal LVEF and G1 DD.  On the day of discharge, patient's GI symptoms and blood pressure improved.  Continued to tolerate diet.  He is advised to hold Ozempic  until he talks to his prescriber/PCP.  Also counseled on management of possible gastroparesis with small portion low fiber residue diet and good glycemic control.  See individual problem list below for more.   Problems addressed during this hospitalization Hypertensive urgency: Likely due to not taking his medication in the setting of intractable nausea and vomiting.  TTE reassuring.  Improved and normotensive. -Continue home Coreg , Entresto , Jardiance , Imdur  and p.o. Lasix .   Myocardial injury/demand ischemia/elevated troponin: No chest pain.  TTE reassuring. -Cardiac meds as above -Continue aspirin  and Lipitor   Intractable nausea and vomiting: Abdominal exam benign.  CT abdomen and pelvis without acute finding. CMP and lipase without significant finding.  Low suspicion for infection.  Could be gastroparesis or due to Ozempic .  Resolved. -Advised to hold Ozempic  until he talks to his prescriber. -Small portion low fiber residue diet for possible gastroparesis.   NIDDM-2 with hyperglycemia: A1c 6.8%.  On Jardiance , Ozempic  and metformin  outpatient. -Continue home Jardiance  and metformin . -Advised to hold Ozempic  due to GI symptoms.   CKD-3A: Stable - Recheck renal function at follow-up.   Hypokalemia: Resolved  Hyponatremia: Resolved Morbid obesity Body mass index is 36.69 kg/m.           Consultations: Cardiology  Time spent 35  minutes  Vital signs Vitals:   01/01/24 2158 01/01/24 2312 01/02/24 0354 01/02/24 0736  BP: 93/78 99/71 117/70 (!) 125/101  Pulse: 94 90 95 97  Temp:  97.8 F (36.6 C) (!) 97.4 F (36.3 C) (!) 97.4 F (36.3 C)  Resp: 18 20 19 18   Height:       Weight:      SpO2: 97% 99% 97% 98%  TempSrc:  Oral Oral Oral  BMI (Calculated):         Discharge exam  GENERAL: No apparent distress.  Nontoxic. HEENT: MMM.  Vision and hearing grossly intact.  NECK: Supple.  No apparent JVD.  RESP:  No IWOB.  Fair aeration bilaterally. CVS:  RRR. Heart sounds normal.  ABD/GI/GU: BS+. Abd soft, NTND.  MSK/EXT:  Moves extremities. No apparent deformity. No edema.  SKIN: no apparent skin lesion or wound NEURO: Awake and alert. Oriented appropriately.  No apparent focal neuro deficit. PSYCH: Calm. Normal affect.   Discharge Instructions Discharge Instructions     Diet - low sodium heart healthy   Complete by: As directed    Discharge instructions   Complete by: As directed    It has been a pleasure taking care of you!  You were hospitalized due to poor appetite, intractable nausea and vomiting and markedly elevated blood pressure.  Your appetite, nausea and vomiting could be due to Ozempic  and/or gastroparesis.  Your symptoms and blood pressure improved.  We recommend holding Ozempic  until you talk to your prescriber.  Please see a separate instruction about gastroparesis.  Follow-up with your primary care doctor in 1 to 2 weeks or sooner if needed.  Follow-up with your cardiologist as soon as possible.   Take care,   Increase activity slowly   Complete by: As directed       Allergies as of 01/02/2024       Reactions   Lisinopril Cough        Medication List     PAUSE taking these medications    Ozempic  (2 MG/DOSE) 8 MG/3ML Sopn Wait to take this until: January 15, 2024 Generic drug: Semaglutide  (2 MG/DOSE) Inject 2 mg as directed once a week.       TAKE these medications    acetaminophen  325 MG tablet Commonly known as: TYLENOL  Take 325 mg by mouth as needed for mild pain (pain score 1-3) or moderate pain (pain score 4-6).   Aspirin  Low Dose 81 MG tablet Generic drug: aspirin  EC Take 1 tablet (81 mg total) by mouth  daily.   atorvastatin  40 MG tablet Commonly known as: LIPITOR Take 1 tablet (40 mg total) by mouth daily.   carvedilol  12.5 MG tablet Commonly known as: COREG  Take 1 tablet (12.5 mg total) by mouth 2 (two) times daily with a meal.   furosemide  40 MG tablet Commonly known as: LASIX  Take 1 tablet (40 mg total) by mouth daily.   hydrALAZINE  25 MG tablet Commonly known as: APRESOLINE  Take 1 tablet (25 mg total) by mouth 3 (three) times daily.   isosorbide  mononitrate 60 MG 24 hr tablet Commonly known as: IMDUR  Take 1 tablet (60 mg total) by mouth daily.   Jardiance  10 MG Tabs tablet Generic drug: empagliflozin  Take 1 tablet (10 mg total) by mouth daily before breakfast.   metFORMIN  1000 MG tablet Commonly known as:  GLUCOPHAGE  Take 1 tablet (1,000 mg total) by mouth 2 (two) times daily with a meal. Must have office visit for refills   Pentips 32G X 4 MM Misc Generic drug: Insulin  Pen Needle Use to inject insulin  as directed.   pregabalin  50 MG capsule Commonly known as: Lyrica  Take 1 capsule (50 mg total) by mouth 2 (two) times daily.   sacubitril -valsartan  97-103 MG Commonly known as: ENTRESTO  Take 1 tablet by mouth 2 (two) times daily. Please call  office to schedule an appointment         Procedures/Studies:   ECHOCARDIOGRAM COMPLETE Result Date: 12/31/2023    ECHOCARDIOGRAM REPORT   Patient Name:   Soma Lizak Date of Exam: 12/31/2023 Medical Rec #:  [id number]  Height:       70.0 in Accession #:    [id number] Weight:       258.4 lb Date of Birth:  [birth year]  BSA:          2.327 m Patient Age:    52 years   BP:           155/100 mmHg Patient Gender: M          HR:           93 bpm. Exam Location:  Inpatient Procedure: 2D Echo, Cardiac Doppler and Color Doppler (Both Spectral and Color            Flow Doppler were utilized during procedure). Indications:    Chest Pain R07.9  History:        Patient has prior history of Echocardiogram examinations, most                  recent 01/27/2021. CHF; Risk Factors:Hypertension and Diabetes.  Sonographer:    Bernarda Rocks Referring Phys: SUNDIL, SUBRINA IMPRESSIONS  1. Left ventricular ejection fraction, by estimation, is 55 to 60%. The left ventricle has normal function. Left ventricular endocardial border not optimally defined to evaluate regional wall motion. There is mild concentric left ventricular hypertrophy. Left ventricular diastolic parameters are consistent with Grade I diastolic dysfunction (impaired relaxation).  2. Right ventricular systolic function is normal. The right ventricular size is normal.  3. The mitral valve is normal in structure. No evidence of mitral valve regurgitation.  4. The aortic valve is normal in structure. Aortic valve regurgitation is not visualized. FINDINGS  Left Ventricle: Left ventricular ejection fraction, by estimation, is 55 to 60%. The left ventricle has normal function. Left ventricular endocardial border not optimally defined to evaluate regional wall motion. The left ventricular internal cavity size was normal in size. There is mild concentric left ventricular hypertrophy. Left ventricular diastolic parameters are consistent with Grade I diastolic dysfunction (impaired relaxation). Right Ventricle: The right ventricular size is normal. No increase in right ventricular wall thickness. Right ventricular systolic function is normal. Left Atrium: Left atrial size was normal in size. Right Atrium: Right atrial size was normal in size. Pericardium: There is no evidence of pericardial effusion. Mitral Valve: The mitral valve is normal in structure. No evidence of mitral valve regurgitation. MV peak gradient, 5.4 mmHg. The mean mitral valve gradient is 2.0 mmHg. Tricuspid Valve: The tricuspid valve is normal in structure. Tricuspid valve regurgitation is not demonstrated. Aortic Valve: The aortic valve is normal in structure. Aortic valve regurgitation is not visualized. Aortic valve mean gradient  measures 3.0 mmHg. Aortic valve peak gradient measures 5.9 mmHg. Aortic valve area, by VTI measures 3.35 cm. Pulmonic Valve: The pulmonic valve was  not well visualized. Pulmonic valve regurgitation is not visualized. Aorta: The aortic root is normal in size and structure. IAS/Shunts: No atrial level shunt detected by color flow Doppler.  LEFT VENTRICLE PLAX 2D LVIDd:         5.90 cm      Diastology LVIDs:         3.90 cm      LV e' medial:    9.36 cm/s LV PW:         0.90 cm      LV E/e' medial:  7.5 LV IVS:        0.80 cm      LV e' lateral:   8.49 cm/s LVOT diam:     2.30 cm      LV E/e' lateral: 8.3 LV SV:         79 LV SV Index:   34 LVOT Area:     4.15 cm  LV Volumes (MOD) LV vol d, MOD A2C: 142.0 ml LV vol d, MOD A4C: 140.0 ml LV vol s, MOD A2C: 60.1 ml LV vol s, MOD A4C: 59.6 ml LV SV MOD A2C:     81.9 ml LV SV MOD A4C:     140.0 ml LV SV MOD BP:      80.7 ml RIGHT VENTRICLE             IVC RV Basal diam:  3.50 cm     IVC diam: 1.70 cm RV S prime:     16.30 cm/s TAPSE (M-mode): 2.6 cm LEFT ATRIUM             Index        RIGHT ATRIUM          Index LA diam:        3.70 cm 1.59 cm/m   RA Area:     9.82 cm LA Vol (A2C):   49.1 ml 21.10 ml/m  RA Volume:   24.50 ml 10.53 ml/m LA Vol (A4C):   43.9 ml 18.86 ml/m LA Biplane Vol: 47.7 ml 20.50 ml/m  AORTIC VALVE                    PULMONIC VALVE AV Area (Vmax):    3.38 cm     PV Vmax:       0.92 m/s AV Area (Vmean):   2.84 cm     PV Peak grad:  3.4 mmHg AV Area (VTI):     3.35 cm AV Vmax:           121.00 cm/s AV Vmean:          83.300 cm/s AV VTI:            0.237 m AV Peak Grad:      5.9 mmHg AV Mean Grad:      3.0 mmHg LVOT Vmax:         98.30 cm/s LVOT Vmean:        57.000 cm/s LVOT VTI:          0.191 m LVOT/AV VTI ratio: 0.81  AORTA Ao Root diam: 3.30 cm Ao Asc diam:  3.20 cm MITRAL VALVE MV Area (PHT): 7.74 cm     SHUNTS MV Area VTI:   3.96 cm     Systemic VTI:  0.19 m MV Peak grad:  5.4 mmHg     Systemic Diam: 2.30 cm MV Mean grad:  2.0 mmHg MV  Vmax:  1.16 m/s MV Vmean:      69.5 cm/s MV Decel Time: 98 msec MV E velocity: 70.60 cm/s MV A velocity: 103.00 cm/s MV E/A ratio:  0.69 Aditya Sabharwal Electronically signed by Ria Commander Signature Date/Time: 12/31/2023/4:01:23 PM    Final    DG Chest 1 View Result Date: 12/30/2023 CLINICAL DATA:  Pain and vomiting. EXAM: CHEST  1 VIEW COMPARISON:  Chest x-ray 01/26/2021 FINDINGS: The heart size and mediastinal contours are within normal limits. Both lungs are clear. The visualized skeletal structures are unremarkable. IMPRESSION: No active disease. Electronically Signed   By: Greig Pique M.D.   On: 12/30/2023 22:55   CT ABDOMEN PELVIS W CONTRAST Result Date: 12/30/2023 EXAM: CT ABDOMEN AND PELVIS WITH CONTRAST 12/30/2023 09:26:06 PM TECHNIQUE: CT of the abdomen and pelvis was performed with the administration of intravenous contrast. Multiplanar reformatted images are provided for review. Automated exposure control, iterative reconstruction, and/or weight-based adjustment of the mA/kV was utilized to reduce the radiation dose to as low as reasonably achievable. COMPARISON: None available. CLINICAL HISTORY: Bowel obstruction suspected. Pt reports vomiting x 6-7 days; sts he is unable to tolerate anything po; reports abd pain that he believes is from vomiting. FINDINGS: LOWER CHEST: No acute abnormality. LIVER: The liver is unremarkable. GALLBLADDER AND BILE DUCTS: Gallbladder is unremarkable. No biliary ductal dilatation. SPLEEN: No acute abnormality. PANCREAS: No acute abnormality. ADRENAL GLANDS: No acute abnormality. KIDNEYS, URETERS AND BLADDER: No stones in the kidneys or ureters. No hydronephrosis. No perinephric or periureteral stranding. Urinary bladder is unremarkable. GI AND BOWEL: Stomach demonstrates no acute abnormality. There is no bowel obstruction. PERITONEUM AND RETROPERITONEUM: No ascites. No free air. VASCULATURE: Mild aortoiliac atherosclerotic calcification. LYMPH NODES: No  lymphadenopathy. REPRODUCTIVE ORGANS: No acute abnormality. BONES AND SOFT TISSUES: No acute osseous abnormality. No focal soft tissue abnormality. IMPRESSION: 1. No acute findings of bowel obstruction. 2. Mild aortoiliac atherosclerotic calcification. raf score: Aortic atherosclerosis (ICD10-I70.0) Electronically signed by: Dorethia Molt MD 12/30/2023 09:32 PM EDT RP Workstation: [id number]       The results of significant diagnostics from this hospitalization (including imaging, microbiology, ancillary and laboratory) are listed below for reference.     Microbiology: Recent Results (from the past 240 hours)  Respiratory (~20 pathogens) panel by PCR     Status: None   Collection Time: 12/31/23  5:30 PM   Specimen: Nasopharyngeal Swab; Respiratory  Result Value Ref Range Status   Adenovirus NOT DETECTED NOT DETECTED Final   Coronavirus 229E NOT DETECTED NOT DETECTED Final    Comment: (NOTE) The Coronavirus on the Respiratory Panel, DOES NOT test for the novel  Coronavirus (2019 nCoV)    Coronavirus HKU1 NOT DETECTED NOT DETECTED Final   Coronavirus NL63 NOT DETECTED NOT DETECTED Final   Coronavirus OC43 NOT DETECTED NOT DETECTED Final   Metapneumovirus NOT DETECTED NOT DETECTED Final   Rhinovirus / Enterovirus NOT DETECTED NOT DETECTED Final   Influenza A NOT DETECTED NOT DETECTED Final   Influenza B NOT DETECTED NOT DETECTED Final   Parainfluenza Virus 1 NOT DETECTED NOT DETECTED Final   Parainfluenza Virus 2 NOT DETECTED NOT DETECTED Final   Parainfluenza Virus 3 NOT DETECTED NOT DETECTED Final   Parainfluenza Virus 4 NOT DETECTED NOT DETECTED Final   Respiratory Syncytial Virus NOT DETECTED NOT DETECTED Final   Bordetella pertussis NOT DETECTED NOT DETECTED Final   Bordetella Parapertussis NOT DETECTED NOT DETECTED Final   Chlamydophila pneumoniae NOT DETECTED NOT DETECTED Final   Mycoplasma pneumoniae NOT DETECTED  NOT DETECTED Final    Comment: Performed at [facility], [address] [place], [place] [address]     Labs:  CBC: Recent Labs  Lab 12/30/23 1922 12/30/23 2124 12/31/23 0550 01/01/24 0319 01/02/24 0006  WBC 7.6  --  6.5 5.9 6.0  HGB 13.9 15.0 13.8 13.0 13.2  HCT 40.8 44.0 41.0 38.0* 39.6  MCV 86.8  --  87.2 87.0 87.4  PLT 298  --  259 225 259   BMP &GFR Recent Labs  Lab 12/30/23 1922 12/30/23 2124 01/01/24 0934 01/02/24 0006 01/02/24 0007  NA 139 139 138 137 138  K 3.5 3.2* 3.0* 3.5 3.6  CL 96*  --  99 99 100  CO2 23  --  20* 23 22  GLUCOSE 130*  --  104* 103* 104*  BUN 16  --  17 18 18   CREATININE 1.52*  --  1.50* 1.64* 1.67*  CALCIUM  9.6  --  8.9 8.9 8.9  MG  --   --  1.7 2.7*  --   PHOS  --   --  3.8  --  3.8   Estimated Creatinine Clearance: 67.5 mL/min (A) (by C-G formula based on SCr of 1.67 mg/dL (H)). Liver & Pancreas: Recent Labs  Lab 12/30/23 1922 01/01/24 0934 01/02/24 0007  AST 16  --   --   ALT 13  --   --   ALKPHOS 59  --   --   BILITOT 1.0  --   --   PROT 8.0  --   --   ALBUMIN  4.6 3.5 3.7   Recent Labs  Lab 12/30/23 1922  LIPASE 31   No results for input(s): AMMONIA in the last 168 hours. Diabetic: No results for input(s): HGBA1C in the last 72 hours. Recent Labs  Lab 01/01/24 0654 01/01/24 1142 01/01/24 1546 01/01/24 2118 01/02/24 0606  GLUCAP 93 89 114* 114* 97   Cardiac Enzymes: No results for input(s): CKTOTAL, CKMB, CKMBINDEX, TROPONINI in the last 168 hours. Recent Labs    08/16/23 1046  PROBNP 99   Coagulation Profile: No results for input(s): INR, PROTIME in the last 168 hours. Thyroid Function Tests: No results for input(s): TSH, T4TOTAL, FREET4, T3FREE, THYROIDAB in the last 72 hours. Lipid Profile: No results for input(s): CHOL, HDL, LDLCALC, TRIG, CHOLHDL, LDLDIRECT in the last 72 hours. Anemia Panel: No results for input(s): VITAMINB12, FOLATE, FERRITIN, TIBC, IRON, RETICCTPCT in the last 72  hours. Urine analysis:    Component Value Date/Time   COLORURINE YELLOW 12/30/2023 1922   APPEARANCEUR CLEAR 12/30/2023 1922   LABSPEC 1.010 12/30/2023 1922   PHURINE 5.5 12/30/2023 1922   GLUCOSEU >=500 (A) 12/30/2023 1922   HGBUR NEGATIVE 12/30/2023 1922   BILIRUBINUR LARGE (A) 12/30/2023 1922   KETONESUR 40 (A) 12/30/2023 1922   PROTEINUR 30 (A) 12/30/2023 1922   NITRITE NEGATIVE 12/30/2023 1922   LEUKOCYTESUR NEGATIVE 12/30/2023 1922   Sepsis Labs: Invalid input(s): PROCALCITONIN, LACTICIDVEN   SIGNED:  Jakayden Cancio T Real Cona, MD  [facility] 01/02/2024, 5:39 PM

## 2024-01-02 NOTE — TOC Transition Note (Signed)
 Transition of Care [facility]) - Discharge Note   Patient Details  Name: Terry Young MRN: [id number] Date of Birth: Mar 07, 1971  [facility] of [facility] CM/SW Contact:  Waddell Barnie Rama, RN Phone Number: 01/02/2024, 12:05 PM   Clinical Narrative:    For dc today, has transport home.          Patient Goals and CMS Choice            Discharge Placement                       Discharge Plan and Services Additional resources added to the After Visit Summary for                                       Social Drivers of Health (SDOH) Interventions SDOH Screenings   Food Insecurity: No Food Insecurity (12/31/2023)  Housing: Low Risk  (12/31/2023)  Transportation Needs: No Transportation Needs (12/31/2023)  Utilities: Not At Risk (12/31/2023)  Depression (PHQ2-9): Low Risk  (11/19/2023)  Financial Resource Strain: High Risk (02/07/2021)  Tobacco Use: Low Risk  (12/30/2023)     Readmission Risk Interventions    01/01/2024    3:19 PM  Readmission Risk Prevention Plan  Medication Screening Complete  Transportation Screening Complete

## 2024-01-02 NOTE — Plan of Care (Signed)
   Problem: Fluid Volume: Goal: Ability to maintain a balanced intake and output will improve Outcome: Progressing

## 2024-01-03 ENCOUNTER — Telehealth: Payer: Self-pay

## 2024-01-03 NOTE — Transitions of Care (Post Inpatient/ED Visit) (Signed)
   01/03/2024  Name: Terry Young MRN: [id number] DOB: 17-Jan-1971  Today's TOC FU Call Status: Today's TOC FU Call Status:: Unsuccessful Call (1st Attempt) Unsuccessful Call (1st Attempt) Date: 01/03/24  Attempted to reach the patient regarding the most recent Inpatient/ED visit.  Follow Up Plan: Additional outreach attempts will be made to reach the patient to complete the Transitions of Care (Post Inpatient/ED visit) call.   Signature  Slater Diesel, RN

## 2024-01-04 ENCOUNTER — Ambulatory Visit: Admitting: Podiatry

## 2024-01-07 ENCOUNTER — Telehealth: Payer: Self-pay

## 2024-01-07 NOTE — Transitions of Care (Post Inpatient/ED Visit) (Signed)
   01/07/2024  Name: Terry Young MRN: [id number] DOB: 08-13-1970  Today's TOC FU Call Status: Today's TOC FU Call Status:: Unsuccessful Call (2nd Attempt) Unsuccessful Call (1st Attempt) Date: 01/03/24 Unsuccessful Call (2nd Attempt) Date: 01/07/24  Attempted to reach the patient regarding the most recent Inpatient/ED visit.  Follow Up Plan: Additional outreach attempts will be made to reach the patient to complete the Transitions of Care (Post Inpatient/ED visit) call.   Signature  Slater Diesel, RN

## 2024-01-08 ENCOUNTER — Telehealth: Payer: Self-pay

## 2024-01-08 NOTE — Transitions of Care (Post Inpatient/ED Visit) (Signed)
   01/08/2024  Name: Terry Young MRN: [id number] DOB: 1970/05/02  Today's TOC FU Call Status: Today's TOC FU Call Status:: Unsuccessful Call (3rd Attempt) Unsuccessful Call (1st Attempt) Date: 01/03/24 Unsuccessful Call (2nd Attempt) Date: 01/07/24 Unsuccessful Call (3rd Attempt) Date: 01/08/24  Attempted to reach the patient regarding the most recent Inpatient/ED visit.  Follow Up Plan: No further outreach attempts will be made at this time. We have been unable to contact the patient.  The patient has called and scheduled a hospital follow up appointment with Haze Servant, NP at [facility] 01/29/2024.   Signature Slater Diesel, RN

## 2024-01-28 ENCOUNTER — Other Ambulatory Visit: Payer: Self-pay

## 2024-01-29 ENCOUNTER — Ambulatory Visit (HOSPITAL_BASED_OUTPATIENT_CLINIC_OR_DEPARTMENT_OTHER): Admitting: Nurse Practitioner

## 2024-01-29 ENCOUNTER — Inpatient Hospital Stay: Admitting: Nurse Practitioner

## 2024-01-29 ENCOUNTER — Other Ambulatory Visit: Payer: Self-pay

## 2024-01-29 ENCOUNTER — Encounter: Payer: Self-pay | Admitting: Nurse Practitioner

## 2024-01-29 DIAGNOSIS — G629 Polyneuropathy, unspecified: Secondary | ICD-10-CM | POA: Diagnosis not present

## 2024-01-29 DIAGNOSIS — R112 Nausea with vomiting, unspecified: Secondary | ICD-10-CM | POA: Diagnosis not present

## 2024-01-29 DIAGNOSIS — Z09 Encounter for follow-up examination after completed treatment for conditions other than malignant neoplasm: Secondary | ICD-10-CM

## 2024-01-29 MED ORDER — ONDANSETRON HCL 4 MG PO TABS
4.0000 mg | ORAL_TABLET | Freq: Three times a day (TID) | ORAL | 1 refills | Status: AC | PRN
  Filled 2024-01-29: qty 30, 10d supply, fill #0

## 2024-01-29 MED ORDER — ONDANSETRON HCL 4 MG PO TABS
4.0000 mg | ORAL_TABLET | Freq: Three times a day (TID) | ORAL | 0 refills | Status: DC | PRN
  Filled 2024-01-29: qty 20, 7d supply, fill #0

## 2024-01-29 MED ORDER — PREGABALIN 75 MG PO CAPS
75.0000 mg | ORAL_CAPSULE | Freq: Two times a day (BID) | ORAL | 0 refills | Status: DC
  Filled 2024-01-29: qty 60, 30d supply, fill #0

## 2024-01-29 NOTE — Progress Notes (Signed)
 Virtual Visit Consent   Terry Young, you are scheduled for a virtual visit with a [facility] provider today. Just as with appointments in the office, your consent must be obtained to participate. Your consent will be active for this visit and any virtual visit you may have with one of our providers in the next 365 days. If you have a MyChart account, a copy of this consent can be sent to you electronically.  As this is a virtual visit, video technology does not allow for your provider to perform a traditional examination. This may limit your provider's ability to fully assess your condition. If your provider identifies any concerns that need to be evaluated in person or the need to arrange testing (such as labs, EKG, etc.), we will make arrangements to do so. Although advances in technology are sophisticated, we cannot ensure that it will always work on either your end or our end. If the connection with a video visit is poor, the visit may have to be switched to a telephone visit. With either a video or telephone visit, we are not always able to ensure that we have a secure connection.  By engaging in this virtual visit, you consent to the provision of healthcare and authorize for your insurance to be billed (if applicable) for the services provided during this visit. Depending on your insurance coverage, you may receive a charge related to this service.  I need to obtain your verbal consent now. Are you willing to proceed with your visit today? Terry Young has provided verbal consent on 01/29/2024 for a virtual visit (video or telephone). Terry LELON Servant, Terry Young  Date: 01/29/2024 10:27 AM   Virtual Visit via Video Note   I, Terry Young, connected with  Terry Young  ([id number], 1970-11-26) on 01/29/24 at  9:10 AM EDT by a video-enabled telemedicine application and verified that I am speaking with the correct person using two identifiers.  Location: Patient: Virtual Visit Location Patient:  Home Provider: Virtual Visit Location Provider: Home Office   I discussed the limitations of evaluation and management by telemedicine and the availability of in person appointments. The patient expressed understanding and agreed to proceed.    History of Present Illness: Terry Young is a 53 y.o. who identifies as a male who was assigned male at birth, and is being seen today for HFU.   He is a patient of Dr Newlin   12-30-2023 through 01-02-2024 Hospital course 53 year old M with PMH of HFpEF, DM-2, HTN and obesity presenting with nausea, vomiting, generalized weakness and lightheadedness for about 1 week, and admitted with intractable nausea and vomiting, hypertensive urgency and elevated troponin.  Of note, patient has been uptitrating his Ozempic  and has been on max dose since last month.   In ED, slightly elevated BP but trended up to 179/105.  Cr 1.5.  Glucose 130.  Otherwise, CMP and CBC without significant finding.  Tropnin 93-->94.  Started on heparin  and admitted for further care.  Cardiology consulted.   ACS felt to be less likely.  Elevated troponin felt to be demand ischemia in the setting of uncontrolled hypertension.  Heparin  discontinued.  TTE with normal LVEF and G1 DD.   On the day of discharge, patient's GI symptoms and blood pressure improved.  Continued to tolerate diet.  He is advised to hold Ozempic  until he talks to his prescriber/PCP.  Also counseled on management of possible gastroparesis with small portion low fiber residue diet and good glycemic control.  As of today he has not made an appointment with Cardiology.  Denies any chest pain.   He continues to experience nausea and can't keep anything down and has not resumed ozempic  since he was discharged from the hospital. Reports being up to date with colonoscopy 2-3 years ago through [facility].  Currently taking lyrica  50 mg BID and gabapentin  for neuropathy in legs and feet. Does not feel lyrica  at 50 mg  has been very effective.  States Legs and feet are still numb and feel cold.     Problems:  Patient Active Problem List   Diagnosis Date Noted   Intractable nausea and vomiting 01/01/2024   Myocardial injury 12/30/2023   Hypokalemia 01/29/2021   Morbid obesity (HCC) 01/29/2021   Systolic CHF, acute on chronic (HCC) 01/27/2021   Acute on chronic combined systolic and diastolic CHF (congestive heart failure) (HCC) 01/26/2021   DM2 (diabetes mellitus, type 2) (HCC) 01/26/2021   Hypertensive urgency 01/26/2021    Allergies:  Allergies  Allergen Reactions   Lisinopril Cough   Medications:  Current Outpatient Medications:    acetaminophen  (TYLENOL ) 325 MG tablet, Take 325 mg by mouth as needed for mild pain (pain score 1-3) or moderate pain (pain score 4-6)., Disp: , Rfl:    aspirin  81 MG EC tablet, Take 1 tablet (81 mg total) by mouth daily., Disp: 90 tablet, Rfl: 0   atorvastatin  (LIPITOR) 40 MG tablet, Take 1 tablet (40 mg total) by mouth daily., Disp: 90 tablet, Rfl: 3   carvedilol  (COREG ) 12.5 MG tablet, Take 1 tablet (12.5 mg total) by mouth 2 (two) times daily with a meal., Disp: 180 tablet, Rfl: 3   empagliflozin  (JARDIANCE ) 10 MG TABS tablet, Take 1 tablet (10 mg total) by mouth daily before breakfast., Disp: 90 tablet, Rfl: 3   furosemide  (LASIX ) 40 MG tablet, Take 1 tablet (40 mg total) by mouth daily., Disp: 90 tablet, Rfl: 3   hydrALAZINE  (APRESOLINE ) 25 MG tablet, Take 1 tablet (25 mg total) by mouth 3 (three) times daily., Disp: 90 tablet, Rfl: 3   Insulin  Pen Needle 32G X 4 MM MISC, Use to inject insulin  as directed., Disp: 100 each, Rfl: 0   isosorbide  mononitrate (IMDUR ) 60 MG 24 hr tablet, Take 1 tablet (60 mg total) by mouth daily., Disp: 90 tablet, Rfl: 3   metFORMIN  (GLUCOPHAGE ) 1000 MG tablet, Take 1 tablet (1,000 mg total) by mouth 2 (two) times daily with a meal. Must have office visit for refills, Disp: 180 tablet, Rfl: 1   ondansetron  (ZOFRAN ) 4 MG tablet, Take  1 tablet (4 mg total) by mouth every 8 (eight) hours as needed for nausea or vomiting., Disp: 30 tablet, Rfl: 1   pregabalin  (LYRICA ) 75 MG capsule, Take 1 capsule (75 mg total) by mouth 2 (two) times daily., Disp: 60 capsule, Rfl: 0   sacubitril -valsartan  (ENTRESTO ) 97-103 MG, Take 1 tablet by mouth 2 (two) times daily. Please call  office to schedule an appointment, Disp: 60 tablet, Rfl: 3   Semaglutide , 2 MG/DOSE, 8 MG/3ML SOPN, Inject 2 mg as directed once a week., Disp: 9 mL, Rfl: 1  Observations/Objective: Patient is well-developed, well-nourished in no acute distress.  Resting comfortably at home.  Head is normocephalic, atraumatic.  No labored breathing.  Speech is clear and coherent with logical content.  Patient is alert and oriented at baseline.    Assessment and Plan: 1. Neuropathy (Primary) DOSE CHANGE - pregabalin  (LYRICA ) 75 MG capsule; Take 1 capsule (75 mg  total) by mouth 2 (two) times daily.  Dispense: 60 capsule; Refill: 0  2. Nausea and vomiting, unspecified vomiting type - Ambulatory referral to Gastroenterology - ondansetron  (ZOFRAN ) 4 MG tablet; Take 1 tablet (4 mg total) by mouth every 8 (eight) hours as needed for nausea or vomiting.  Dispense: 30 tablet; Refill: 1  3. Hospital discharge follow-up - ondansetron  (ZOFRAN ) 4 MG tablet; Take 1 tablet (4 mg total) by mouth every 8 (eight) hours as needed for nausea or vomiting.  Dispense: 30 tablet; Refill: 1    Follow Up Instructions: I discussed the assessment and treatment plan with the patient. The patient was provided an opportunity to ask questions and all were answered. The patient agreed with the plan and demonstrated an understanding of the instructions.  A copy of instructions were sent to the patient via MyChart unless otherwise noted below.     The patient was advised to call back or seek an in-person evaluation if the symptoms worsen or if the condition fails to improve as anticipated.    Amaziah Raisanen W  Misa Fedorko, Terry Young

## 2024-01-30 ENCOUNTER — Other Ambulatory Visit: Payer: Self-pay

## 2024-02-14 NOTE — Telephone Encounter (Signed)
 Copied from CRM [id number]. Topic: Referral - Status >> Feb 13, 2024  4:31 PM Shanda MATSU wrote:  Reason for CRM: Patient called to check status of referral to gastroenterology, adv patient that provider referred him to [facility], offered patient phone # tp specialist but patient req thatt he info be sent to him in a message via MyChart.

## 2024-02-15 ENCOUNTER — Encounter: Payer: Self-pay | Admitting: Physician Assistant

## 2024-02-16 ENCOUNTER — Inpatient Hospital Stay (HOSPITAL_COMMUNITY)
Admission: EM | Admit: 2024-02-16 | Discharge: 2024-02-22 | DRG: 391 | Disposition: A | Discharge status: Home Health | Attending: Internal Medicine | Admitting: Internal Medicine

## 2024-02-16 ENCOUNTER — Other Ambulatory Visit: Payer: Self-pay

## 2024-02-16 ENCOUNTER — Emergency Department (HOSPITAL_COMMUNITY)

## 2024-02-16 DIAGNOSIS — E66812 Obesity, class 2: Secondary | ICD-10-CM | POA: Diagnosis present

## 2024-02-16 DIAGNOSIS — K760 Fatty (change of) liver, not elsewhere classified: Secondary | ICD-10-CM | POA: Diagnosis present

## 2024-02-16 DIAGNOSIS — I700 Atherosclerosis of aorta: Secondary | ICD-10-CM | POA: Diagnosis present

## 2024-02-16 DIAGNOSIS — R197 Diarrhea, unspecified: Secondary | ICD-10-CM | POA: Diagnosis not present

## 2024-02-16 DIAGNOSIS — E1122 Type 2 diabetes mellitus with diabetic chronic kidney disease: Secondary | ICD-10-CM | POA: Diagnosis present

## 2024-02-16 DIAGNOSIS — Z6835 Body mass index (BMI) 35.0-35.9, adult: Secondary | ICD-10-CM

## 2024-02-16 DIAGNOSIS — E1165 Type 2 diabetes mellitus with hyperglycemia: Secondary | ICD-10-CM

## 2024-02-16 DIAGNOSIS — R262 Difficulty in walking, not elsewhere classified: Secondary | ICD-10-CM | POA: Diagnosis present

## 2024-02-16 DIAGNOSIS — I130 Hypertensive heart and chronic kidney disease with heart failure and stage 1 through stage 4 chronic kidney disease, or unspecified chronic kidney disease: Secondary | ICD-10-CM | POA: Diagnosis present

## 2024-02-16 DIAGNOSIS — N1831 Chronic kidney disease, stage 3a: Secondary | ICD-10-CM | POA: Diagnosis present

## 2024-02-16 DIAGNOSIS — I160 Hypertensive urgency: Secondary | ICD-10-CM | POA: Diagnosis present

## 2024-02-16 DIAGNOSIS — R112 Nausea with vomiting, unspecified: Principal | ICD-10-CM | POA: Diagnosis present

## 2024-02-16 DIAGNOSIS — R109 Unspecified abdominal pain: Secondary | ICD-10-CM | POA: Diagnosis present

## 2024-02-16 DIAGNOSIS — I5020 Unspecified systolic (congestive) heart failure: Secondary | ICD-10-CM | POA: Diagnosis present

## 2024-02-16 DIAGNOSIS — E860 Dehydration: Secondary | ICD-10-CM | POA: Diagnosis present

## 2024-02-16 DIAGNOSIS — A4159 Other Gram-negative sepsis: Secondary | ICD-10-CM | POA: Clinically undetermined

## 2024-02-16 DIAGNOSIS — Z7982 Long term (current) use of aspirin: Secondary | ICD-10-CM

## 2024-02-16 DIAGNOSIS — R7989 Other specified abnormal findings of blood chemistry: Secondary | ICD-10-CM | POA: Diagnosis present

## 2024-02-16 DIAGNOSIS — E872 Acidosis, unspecified: Secondary | ICD-10-CM | POA: Diagnosis present

## 2024-02-16 DIAGNOSIS — I5032 Chronic diastolic (congestive) heart failure: Secondary | ICD-10-CM | POA: Diagnosis present

## 2024-02-16 DIAGNOSIS — E861 Hypovolemia: Secondary | ICD-10-CM | POA: Diagnosis present

## 2024-02-16 DIAGNOSIS — Z79899 Other long term (current) drug therapy: Secondary | ICD-10-CM

## 2024-02-16 DIAGNOSIS — E119 Type 2 diabetes mellitus without complications: Secondary | ICD-10-CM

## 2024-02-16 DIAGNOSIS — Z7984 Long term (current) use of oral hypoglycemic drugs: Secondary | ICD-10-CM

## 2024-02-16 DIAGNOSIS — B961 Klebsiella pneumoniae [K. pneumoniae] as the cause of diseases classified elsewhere: Secondary | ICD-10-CM

## 2024-02-16 DIAGNOSIS — I959 Hypotension, unspecified: Secondary | ICD-10-CM | POA: Diagnosis not present

## 2024-02-16 DIAGNOSIS — E876 Hypokalemia: Secondary | ICD-10-CM | POA: Diagnosis present

## 2024-02-16 DIAGNOSIS — R9431 Abnormal electrocardiogram [ECG] [EKG]: Secondary | ICD-10-CM | POA: Diagnosis present

## 2024-02-16 LAB — URINALYSIS, ROUTINE W REFLEX MICROSCOPIC
Bacteria, UA: NONE SEEN
Bilirubin Urine: NEGATIVE
Glucose, UA: >=500 mg/dL — AB
Hgb urine dipstick: NEGATIVE
Ketones, ur: 20 mg/dL — AB
Leukocytes,Ua: NEGATIVE
Nitrite: NEGATIVE
Protein, ur: NEGATIVE mg/dL
Specific Gravity, Urine: 1.01 (ref 1.005–1.030)
pH: 7 (ref 5.0–8.0)

## 2024-02-16 LAB — CBC WITH DIFFERENTIAL/PLATELET
Abs Immature Granulocytes: 0.02 K/uL (ref 0.00–0.07)
Basophils Absolute: 0 K/uL (ref 0.0–0.1)
Basophils Relative: 0 %
Eosinophils Absolute: 0 K/uL (ref 0.0–0.5)
Eosinophils Relative: 0 %
HCT: 46.3 % (ref 39.0–52.0)
Hemoglobin: 15.3 g/dL (ref 13.0–17.0)
Immature Granulocytes: 0 %
Lymphocytes Relative: 19 %
Lymphs Abs: 1.4 K/uL (ref 0.7–4.0)
MCH: 28.5 pg (ref 26.0–34.0)
MCHC: 33 g/dL (ref 30.0–36.0)
MCV: 86.4 fL (ref 80.0–100.0)
Monocytes Absolute: 0.6 K/uL (ref 0.1–1.0)
Monocytes Relative: 9 %
Neutro Abs: 5.3 K/uL (ref 1.7–7.7)
Neutrophils Relative %: 72 %
Platelets: 200 K/uL (ref 150–400)
RBC: 5.36 MIL/uL (ref 4.22–5.81)
RDW: 13.5 % (ref 11.5–15.5)
WBC: 7.4 K/uL (ref 4.0–10.5)
nRBC: 0 % (ref 0.0–0.2)

## 2024-02-16 LAB — BLOOD GAS, VENOUS
Acid-Base Excess: 2.2 mmol/L — ABNORMAL HIGH (ref 0.0–2.0)
Bicarbonate: 25.5 mmol/L (ref 20.0–28.0)
O2 Saturation: 35 %
Patient temperature: 37
pCO2, Ven: 35 mmHg — ABNORMAL LOW (ref 44–60)
pH, Ven: 7.47 — ABNORMAL HIGH (ref 7.25–7.43)
pO2, Ven: <31 mmHg — CL (ref 32–45)

## 2024-02-16 LAB — I-STAT CG4 LACTIC ACID, ED: Lactic Acid, Venous: 3.5 mmol/L (ref 0.5–1.9)

## 2024-02-16 LAB — COMPREHENSIVE METABOLIC PANEL WITH GFR
ALT: 50 U/L — ABNORMAL HIGH (ref 0–44)
AST: 51 U/L — ABNORMAL HIGH (ref 15–41)
Albumin: 4 g/dL (ref 3.5–5.0)
Alkaline Phosphatase: 57 U/L (ref 38–126)
Anion gap: 18 — ABNORMAL HIGH (ref 5–15)
BUN: 20 mg/dL (ref 6–20)
CO2: 23 mmol/L (ref 22–32)
Calcium: 9.3 mg/dL (ref 8.9–10.3)
Chloride: 91 mmol/L — ABNORMAL LOW (ref 98–111)
Creatinine, Ser: 1.23 mg/dL (ref 0.61–1.24)
GFR, Estimated: >60 mL/min (ref >60)
Glucose, Bld: 316 mg/dL — ABNORMAL HIGH (ref 70–99)
Potassium: 3.8 mmol/L (ref 3.5–5.1)
Sodium: 132 mmol/L — ABNORMAL LOW (ref 135–145)
Total Bilirubin: 1.6 mg/dL — ABNORMAL HIGH (ref 0.0–1.2)
Total Protein: 7.2 g/dL (ref 6.5–8.1)

## 2024-02-16 LAB — TROPONIN T, HIGH SENSITIVITY: Troponin T High Sensitivity: 61 ng/L — ABNORMAL HIGH (ref 0–19)

## 2024-02-16 LAB — LIPASE, BLOOD: Lipase: 49 U/L (ref 11–51)

## 2024-02-16 LAB — MAGNESIUM: Magnesium: 2 mg/dL (ref 1.7–2.4)

## 2024-02-16 LAB — CBG MONITORING, ED: Glucose-Capillary: 395 mg/dL — ABNORMAL HIGH (ref 70–99)

## 2024-02-16 MED ORDER — LACTATED RINGERS IV BOLUS
1000.0000 mL | Freq: Once | INTRAVENOUS | Status: AC
  Administered 2024-02-16: 1000 mL via INTRAVENOUS

## 2024-02-16 MED ORDER — METOCLOPRAMIDE HCL 5 MG/ML IJ SOLN
10.0000 mg | INTRAMUSCULAR | Status: AC
  Administered 2024-02-17: 10 mg via INTRAVENOUS
  Filled 2024-02-16: qty 2

## 2024-02-16 MED ORDER — IOHEXOL 300 MG/ML  SOLN
100.0000 mL | Freq: Once | INTRAMUSCULAR | Status: AC | PRN
  Administered 2024-02-17: 100 mL via INTRAVENOUS

## 2024-02-16 MED ORDER — LACTATED RINGERS IV BOLUS
500.0000 mL | Freq: Once | INTRAVENOUS | Status: AC
  Administered 2024-02-16: 500 mL via INTRAVENOUS

## 2024-02-16 NOTE — ED Provider Notes (Signed)
 [place] EMERGENCY DEPARTMENT AT [facility] Provider Note   CSN: [id number] Arrival date & time: 02/16/24  2053     Patient presents with: Weakness   Terry Young is a 53 y.o. male.   Patient with h/o HFpEF, HTN and DM2, admitted 12/2023 with nausea and vomiting, thought due to uptitration of Ozempic , presents to the emergency department with worsening nausea and vomiting as well as generalized abdominal pain worse with vomiting.  Patient states that he has been having symptoms since getting out of the hospital but have been worse here recently.  He has been unable to keep his medications down.  No longer on Ozempic , metformin  only.  No diarrhea.  Last bowel movement 2 days ago, passing a little bit of gas.  Reports less urination.  No cough, fevers, other infectious symptoms.       Prior to Admission medications   Medication Sig Start Date End Date Taking? Authorizing Provider  acetaminophen  (TYLENOL ) 325 MG tablet Take 325 mg by mouth as needed for mild pain (pain score 1-3) or moderate pain (pain score 4-6).    [provider]  aspirin  81 MG EC tablet Take 1 tablet (81 mg total) by mouth daily. 01/31/21   Patsy Lenis, MD  atorvastatin  (LIPITOR) 40 MG tablet Take 1 tablet (40 mg total) by mouth daily. 10/30/23   Bensimhon, Toribio SAUNDERS, MD  carvedilol  (COREG ) 12.5 MG tablet Take 1 tablet (12.5 mg total) by mouth 2 (two) times daily with a meal. 10/30/23   Bensimhon, Toribio SAUNDERS, MD  empagliflozin  (JARDIANCE ) 10 MG TABS tablet Take 1 tablet (10 mg total) by mouth daily before breakfast. 10/30/23   Bensimhon, Toribio SAUNDERS, MD  furosemide  (LASIX ) 40 MG tablet Take 1 tablet (40 mg total) by mouth daily. 10/30/23   Bensimhon, Toribio SAUNDERS, MD  hydrALAZINE  (APRESOLINE ) 25 MG tablet Take 1 tablet (25 mg total) by mouth 3 (three) times daily. 10/30/23   Bensimhon, Toribio SAUNDERS, MD  Insulin  Pen Needle 32G X 4 MM MISC Use to inject insulin  as directed. 01/31/21   Patsy Lenis, MD  isosorbide   mononitrate (IMDUR ) 60 MG 24 hr tablet Take 1 tablet (60 mg total) by mouth daily. 10/30/23   Bensimhon, Toribio SAUNDERS, MD  metFORMIN  (GLUCOPHAGE ) 1000 MG tablet Take 1 tablet (1,000 mg total) by mouth 2 (two) times daily with a meal. Must have office visit for refills 08/16/23   Newlin, Enobong, MD  ondansetron  (ZOFRAN ) 4 MG tablet Take 1 tablet (4 mg total) by mouth every 8 (eight) hours as needed for nausea or vomiting. 01/29/24   Fleming, Zelda W, NP  pregabalin  (LYRICA ) 75 MG capsule Take 1 capsule (75 mg total) by mouth 2 (two) times daily. 01/29/24   Fleming, Zelda W, NP  sacubitril -valsartan  (ENTRESTO ) 97-103 MG Take 1 tablet by mouth 2 (two) times daily. Please call  office to schedule an appointment 10/17/23   Bensimhon, Toribio SAUNDERS, MD  Semaglutide , 2 MG/DOSE, 8 MG/3ML SOPN Inject 2 mg as directed once a week. 10/18/23   Newlin, Enobong, MD    Allergies: Lisinopril    Review of Systems  Updated Vital Signs BP (!) 163/133   Pulse (!) 110   Temp 98.4 F (36.9 C) (Oral)   Resp 16   SpO2 100%   Physical Exam Vitals and nursing note reviewed.  Constitutional:      General: He is not in acute distress.    Appearance: He is well-developed.  HENT:     Head:  Normocephalic and atraumatic.     Nose: Nose normal.     Mouth/Throat:     Mouth: Mucous membranes are dry.  Eyes:     General:        Right eye: No discharge.        Left eye: No discharge.     Conjunctiva/sclera: Conjunctivae normal.  Cardiovascular:     Rate and Rhythm: Regular rhythm. Tachycardia present.     Heart sounds: Normal heart sounds.     Comments: Multifocal PVCs on monitor Pulmonary:     Effort: Pulmonary effort is normal.     Breath sounds: Normal breath sounds.  Abdominal:     Palpations: Abdomen is soft.     Tenderness: There is no abdominal tenderness.  Musculoskeletal:     Cervical back: Normal range of motion and neck supple.  Skin:    General: Skin is warm and dry.  Neurological:     Mental Status: He is  alert.     (all labs ordered are listed, but only abnormal results are displayed) Labs Reviewed  CBG MONITORING, ED - Abnormal; Notable for the following components:      Result Value   Glucose-Capillary 395 (*)    All other components within normal limits    EKG: None  Radiology: No results found.   Procedures   Medications Ordered in the ED  lactated ringers bolus 1,000 mL (has no administration in time range)   ED Course  Patient seen and examined. History obtained directly from patient.  Reviewed hospitalization from September.  Labs/EKG: Ordered CBC, CMP, lipase, UA, magnesium , lactate, troponin given history of elevated troponins, VBG given elevated blood sugar and vomiting.  Imaging: None ordered  Medications/Fluids: Ordered: Patient received Zofran  and route to hospital, IV fluids ordered.   Most recent vital signs reviewed and are as follows: BP (!) 163/133   Pulse (!) 110   Temp 98.4 F (36.9 C) (Oral)   Resp 16   SpO2 100%   Initial impression: Nausea, vomiting, dehydration  11:44 PM Reassessment performed. Patient appears stable. Having N/V. 1500cc fluids complete.   Labs personally reviewed and interpreted including: CBC unremarkable; CMP sodium 132, normal potassium, chloride 91, glucose 316 with mildly elevated anion gap at 18, mildly elevated AST and ALT; lipase normal; magnesium  normal; troponin 61 which is lower than previous admission; blood gas, pH 7.47; lactate 3.5.  Imaging: CT imaging of the abdomen pelvis pending  Reviewed pertinent lab work and imaging with patient at bedside. Questions answered.   Most current vital signs reviewed and are as follows: BP (!) 163/133   Pulse (!) 110   Temp 98.4 F (36.9 C) (Oral)   Resp 16   SpO2 100%   Plan: Will check troponin and lactate at midnight.  Awaiting CT scan of the abdomen pelvis.  11:58 PM Humes PA-C at shift change. Awaiting CT, plan admit.                                     Medical Decision Making Amount and/or Complexity of Data Reviewed Labs: ordered. Radiology: ordered.   For this patient's complaint of abdominal pain, the following conditions were considered on the differential diagnosis: gastritis/PUD, enteritis/duodenitis, appendicitis, cholelithiasis/cholecystitis, cholangitis, pancreatitis, ruptured viscus, colitis, diverticulitis, small/large bowel obstruction, proctitis, cystitis, pyelonephritis, ureteral colic, aortic dissection, aortic aneurysm. Atypical chest etiologies were also considered including ACS, PE, and pneumonia.  Patient  with significant dehydration, poorly controlled symptoms, will require admission.  Awaiting CT.     Final diagnoses:  Intractable nausea and vomiting    ED Discharge Orders     None          Desiderio Chew, PA-C 02/17/24 0000    Ula Prentice SAUNDERS, MD 02/17/24 2342

## 2024-02-16 NOTE — ED Triage Notes (Signed)
 Pt BIB [facility] from home. Pt c/o nausea and weakness. Pt Hx GI issues since August. Pt reports not being able to keep anything down for past 2 days. Ems administered 4mg  zofran .  358CBG 25CO2 97% RA 107HR 156/100 14RR

## 2024-02-17 ENCOUNTER — Encounter (HOSPITAL_COMMUNITY): Payer: Self-pay | Admitting: Family Medicine

## 2024-02-17 DIAGNOSIS — Z6835 Body mass index (BMI) 35.0-35.9, adult: Secondary | ICD-10-CM | POA: Diagnosis not present

## 2024-02-17 DIAGNOSIS — R197 Diarrhea, unspecified: Secondary | ICD-10-CM | POA: Diagnosis not present

## 2024-02-17 DIAGNOSIS — R262 Difficulty in walking, not elsewhere classified: Secondary | ICD-10-CM | POA: Diagnosis present

## 2024-02-17 DIAGNOSIS — R7989 Other specified abnormal findings of blood chemistry: Secondary | ICD-10-CM | POA: Diagnosis present

## 2024-02-17 DIAGNOSIS — K760 Fatty (change of) liver, not elsewhere classified: Secondary | ICD-10-CM | POA: Diagnosis present

## 2024-02-17 DIAGNOSIS — R9431 Abnormal electrocardiogram [ECG] [EKG]: Secondary | ICD-10-CM | POA: Diagnosis present

## 2024-02-17 DIAGNOSIS — I160 Hypertensive urgency: Secondary | ICD-10-CM | POA: Diagnosis not present

## 2024-02-17 DIAGNOSIS — A4159 Other Gram-negative sepsis: Secondary | ICD-10-CM | POA: Diagnosis not present

## 2024-02-17 DIAGNOSIS — Z79899 Other long term (current) drug therapy: Secondary | ICD-10-CM | POA: Diagnosis not present

## 2024-02-17 DIAGNOSIS — N1831 Chronic kidney disease, stage 3a: Secondary | ICD-10-CM | POA: Diagnosis present

## 2024-02-17 DIAGNOSIS — I130 Hypertensive heart and chronic kidney disease with heart failure and stage 1 through stage 4 chronic kidney disease, or unspecified chronic kidney disease: Secondary | ICD-10-CM | POA: Diagnosis present

## 2024-02-17 DIAGNOSIS — I5032 Chronic diastolic (congestive) heart failure: Secondary | ICD-10-CM | POA: Diagnosis present

## 2024-02-17 DIAGNOSIS — E66812 Obesity, class 2: Secondary | ICD-10-CM | POA: Diagnosis present

## 2024-02-17 DIAGNOSIS — R7881 Bacteremia: Secondary | ICD-10-CM | POA: Diagnosis not present

## 2024-02-17 DIAGNOSIS — E861 Hypovolemia: Secondary | ICD-10-CM | POA: Diagnosis present

## 2024-02-17 DIAGNOSIS — Z794 Long term (current) use of insulin: Secondary | ICD-10-CM

## 2024-02-17 DIAGNOSIS — R109 Unspecified abdominal pain: Secondary | ICD-10-CM | POA: Diagnosis present

## 2024-02-17 DIAGNOSIS — Z7982 Long term (current) use of aspirin: Secondary | ICD-10-CM | POA: Diagnosis not present

## 2024-02-17 DIAGNOSIS — E1122 Type 2 diabetes mellitus with diabetic chronic kidney disease: Secondary | ICD-10-CM | POA: Diagnosis present

## 2024-02-17 DIAGNOSIS — R112 Nausea with vomiting, unspecified: Secondary | ICD-10-CM

## 2024-02-17 DIAGNOSIS — I5020 Unspecified systolic (congestive) heart failure: Secondary | ICD-10-CM

## 2024-02-17 DIAGNOSIS — I959 Hypotension, unspecified: Secondary | ICD-10-CM | POA: Diagnosis not present

## 2024-02-17 DIAGNOSIS — Z7984 Long term (current) use of oral hypoglycemic drugs: Secondary | ICD-10-CM | POA: Diagnosis not present

## 2024-02-17 DIAGNOSIS — E872 Acidosis, unspecified: Secondary | ICD-10-CM | POA: Diagnosis present

## 2024-02-17 DIAGNOSIS — E1165 Type 2 diabetes mellitus with hyperglycemia: Secondary | ICD-10-CM

## 2024-02-17 DIAGNOSIS — I700 Atherosclerosis of aorta: Secondary | ICD-10-CM | POA: Diagnosis present

## 2024-02-17 DIAGNOSIS — E860 Dehydration: Secondary | ICD-10-CM | POA: Diagnosis present

## 2024-02-17 DIAGNOSIS — E876 Hypokalemia: Secondary | ICD-10-CM | POA: Diagnosis present

## 2024-02-17 LAB — CBG MONITORING, ED
Glucose-Capillary: 249 mg/dL — ABNORMAL HIGH (ref 70–99)
Glucose-Capillary: 266 mg/dL — ABNORMAL HIGH (ref 70–99)
Glucose-Capillary: 208 mg/dL — ABNORMAL HIGH (ref 70–99)

## 2024-02-17 LAB — HEPATIC FUNCTION PANEL
ALT: 43 U/L (ref 0–44)
AST: 46 U/L — ABNORMAL HIGH (ref 15–41)
Albumin: 3.5 g/dL (ref 3.5–5.0)
Alkaline Phosphatase: 49 U/L (ref 38–126)
Bilirubin, Direct: 0.5 mg/dL — ABNORMAL HIGH (ref 0.0–0.2)
Indirect Bilirubin: 0.8 mg/dL (ref 0.3–0.9)
Total Bilirubin: 1.2 mg/dL (ref 0.0–1.2)
Total Protein: 6 g/dL — ABNORMAL LOW (ref 6.5–8.1)

## 2024-02-17 LAB — CBC
HCT: 42.8 % (ref 39.0–52.0)
Hemoglobin: 14.1 g/dL (ref 13.0–17.0)
MCH: 28.5 pg (ref 26.0–34.0)
MCHC: 32.9 g/dL (ref 30.0–36.0)
MCV: 86.5 fL (ref 80.0–100.0)
Platelets: 198 K/uL (ref 150–400)
RBC: 4.95 MIL/uL (ref 4.22–5.81)
RDW: 13.6 % (ref 11.5–15.5)
WBC: 8.5 K/uL (ref 4.0–10.5)
nRBC: 0 % (ref 0.0–0.2)

## 2024-02-17 LAB — BASIC METABOLIC PANEL WITH GFR
Anion gap: 13 (ref 5–15)
BUN: 15 mg/dL (ref 6–20)
CO2: 23 mmol/L (ref 22–32)
Calcium: 8.4 mg/dL — ABNORMAL LOW (ref 8.9–10.3)
Chloride: 99 mmol/L (ref 98–111)
Creatinine, Ser: 0.97 mg/dL (ref 0.61–1.24)
GFR, Estimated: >60 mL/min (ref >60)
Glucose, Bld: 208 mg/dL — ABNORMAL HIGH (ref 70–99)
Potassium: 3.2 mmol/L — ABNORMAL LOW (ref 3.5–5.1)
Sodium: 135 mmol/L (ref 135–145)

## 2024-02-17 LAB — GLUCOSE, CAPILLARY
Glucose-Capillary: 195 mg/dL — ABNORMAL HIGH (ref 70–99)
Glucose-Capillary: 199 mg/dL — ABNORMAL HIGH (ref 70–99)
Glucose-Capillary: 221 mg/dL — ABNORMAL HIGH (ref 70–99)
Glucose-Capillary: 227 mg/dL — ABNORMAL HIGH (ref 70–99)

## 2024-02-17 LAB — TROPONIN T, HIGH SENSITIVITY: Troponin T High Sensitivity: 59 ng/L — ABNORMAL HIGH (ref 0–19)

## 2024-02-17 LAB — I-STAT CG4 LACTIC ACID, ED: Lactic Acid, Venous: 4.3 mmol/L (ref 0.5–1.9)

## 2024-02-17 LAB — HEMOGLOBIN A1C
Hgb A1c MFr Bld: 7.2 % — ABNORMAL HIGH (ref 4.8–5.6)
Mean Plasma Glucose: 159.94 mg/dL

## 2024-02-17 LAB — LACTIC ACID, PLASMA: Lactic Acid, Venous: 2.3 mmol/L (ref 0.5–1.9)

## 2024-02-17 MED ORDER — BOOST / RESOURCE BREEZE PO LIQD CUSTOM
1.0000 | Freq: Three times a day (TID) | ORAL | Status: DC
  Administered 2024-02-17 – 2024-02-18 (×4): 1 via ORAL
  Administered 2024-02-18: 237 mL via ORAL
  Administered 2024-02-19 – 2024-02-22 (×5): 1 via ORAL

## 2024-02-17 MED ORDER — LACTATED RINGERS IV BOLUS
1000.0000 mL | Freq: Once | INTRAVENOUS | Status: AC
  Administered 2024-02-17: 1000 mL via INTRAVENOUS

## 2024-02-17 MED ORDER — LACTATED RINGERS IV BOLUS
500.0000 mL | Freq: Once | INTRAVENOUS | Status: AC
  Administered 2024-02-17: 500 mL via INTRAVENOUS

## 2024-02-17 MED ORDER — SODIUM CHLORIDE 0.9 % IV SOLN: INTRAVENOUS | Status: AC

## 2024-02-17 MED ORDER — ISOSORBIDE MONONITRATE ER 60 MG PO TB24
60.0000 mg | ORAL_TABLET | Freq: Every day | ORAL | Status: DC
  Administered 2024-02-17 – 2024-02-22 (×6): 60 mg via ORAL
  Filled 2024-02-17 (×7): qty 1

## 2024-02-17 MED ORDER — ACETAMINOPHEN 325 MG PO TABS
650.0000 mg | ORAL_TABLET | Freq: Four times a day (QID) | ORAL | Status: DC | PRN
  Administered 2024-02-17: 650 mg via ORAL
  Filled 2024-02-17: qty 2

## 2024-02-17 MED ORDER — SCOPOLAMINE 1 MG/3DAYS TD PT72
1.0000 | MEDICATED_PATCH | TRANSDERMAL | Status: DC
  Administered 2024-02-17 – 2024-02-20 (×2): 1 mg via TRANSDERMAL
  Filled 2024-02-17 (×3): qty 1

## 2024-02-17 MED ORDER — ATORVASTATIN CALCIUM 40 MG PO TABS
40.0000 mg | ORAL_TABLET | Freq: Every day | ORAL | Status: DC
  Administered 2024-02-17 – 2024-02-22 (×6): 40 mg via ORAL
  Filled 2024-02-17 (×6): qty 1

## 2024-02-17 MED ORDER — CARVEDILOL 12.5 MG PO TABS
12.5000 mg | ORAL_TABLET | Freq: Two times a day (BID) | ORAL | Status: DC
  Administered 2024-02-17: 12.5 mg via ORAL
  Filled 2024-02-17 (×2): qty 1

## 2024-02-17 MED ORDER — ENOXAPARIN SODIUM 40 MG/0.4ML IJ SOSY
40.0000 mg | PREFILLED_SYRINGE | INTRAMUSCULAR | Status: DC
  Administered 2024-02-17 – 2024-02-19 (×3): 40 mg via SUBCUTANEOUS
  Filled 2024-02-17 (×3): qty 0.4

## 2024-02-17 MED ORDER — PREGABALIN 75 MG PO CAPS
75.0000 mg | ORAL_CAPSULE | Freq: Two times a day (BID) | ORAL | Status: DC
  Administered 2024-02-17 – 2024-02-22 (×11): 75 mg via ORAL
  Filled 2024-02-17 (×11): qty 1

## 2024-02-17 MED ORDER — OXYCODONE HCL 5 MG PO TABS
5.0000 mg | ORAL_TABLET | ORAL | Status: DC | PRN
  Administered 2024-02-18: 5 mg via ORAL
  Filled 2024-02-17: qty 1

## 2024-02-17 MED ORDER — POTASSIUM CHLORIDE 10 MEQ/100ML IV SOLN
10.0000 meq | INTRAVENOUS | Status: AC
  Administered 2024-02-17 (×4): 10 meq via INTRAVENOUS
  Filled 2024-02-17 (×4): qty 100

## 2024-02-17 MED ORDER — ACETAMINOPHEN 650 MG RE SUPP: 650.0000 mg | Freq: Four times a day (QID) | RECTAL | Status: DC | PRN

## 2024-02-17 MED ORDER — SODIUM CHLORIDE 0.9 % IV BOLUS
500.0000 mL | Freq: Once | INTRAVENOUS | Status: AC
  Administered 2024-02-17: 500 mL via INTRAVENOUS

## 2024-02-17 MED ORDER — MIDODRINE HCL 5 MG PO TABS
10.0000 mg | ORAL_TABLET | Freq: Once | ORAL | Status: AC
  Administered 2024-02-17: 10 mg via ORAL
  Filled 2024-02-17: qty 2

## 2024-02-17 MED ORDER — HYDRALAZINE HCL 25 MG PO TABS
25.0000 mg | ORAL_TABLET | Freq: Three times a day (TID) | ORAL | Status: DC
  Administered 2024-02-17 – 2024-02-20 (×5): 25 mg via ORAL
  Filled 2024-02-17 (×6): qty 1

## 2024-02-17 MED ORDER — SACUBITRIL-VALSARTAN 97-103 MG PO TABS
1.0000 | ORAL_TABLET | Freq: Two times a day (BID) | ORAL | Status: DC
  Filled 2024-02-17: qty 1

## 2024-02-17 MED ORDER — SODIUM CHLORIDE 0.9% FLUSH
3.0000 mL | Freq: Two times a day (BID) | INTRAVENOUS | Status: DC
  Administered 2024-02-17 – 2024-02-22 (×11): 3 mL via INTRAVENOUS

## 2024-02-17 MED ORDER — TRIMETHOBENZAMIDE HCL 100 MG/ML IM SOLN
200.0000 mg | Freq: Four times a day (QID) | INTRAMUSCULAR | Status: DC | PRN
  Administered 2024-02-17 – 2024-02-20 (×2): 200 mg via INTRAMUSCULAR
  Filled 2024-02-17 (×3): qty 2

## 2024-02-17 MED ORDER — HYDROMORPHONE HCL 1 MG/ML IJ SOLN
0.5000 mg | INTRAMUSCULAR | Status: DC | PRN
  Administered 2024-02-17: 0.5 mg via INTRAVENOUS
  Filled 2024-02-17: qty 1

## 2024-02-17 MED ORDER — INSULIN ASPART 100 UNIT/ML IJ SOLN
0.0000 [IU] | INTRAMUSCULAR | Status: DC
  Administered 2024-02-17 (×2): 2 [IU] via SUBCUTANEOUS
  Administered 2024-02-17: 3 [IU] via SUBCUTANEOUS
  Administered 2024-02-17: 2 [IU] via SUBCUTANEOUS
  Administered 2024-02-17: 1 [IU] via SUBCUTANEOUS
  Administered 2024-02-17: 2 [IU] via SUBCUTANEOUS
  Administered 2024-02-18: 1 [IU] via SUBCUTANEOUS
  Administered 2024-02-18 (×2): 2 [IU] via SUBCUTANEOUS
  Administered 2024-02-19 (×3): 1 [IU] via SUBCUTANEOUS
  Administered 2024-02-19 – 2024-02-20 (×2): 2 [IU] via SUBCUTANEOUS
  Administered 2024-02-20 (×2): 1 [IU] via SUBCUTANEOUS
  Filled 2024-02-17: qty 2
  Filled 2024-02-17 (×3): qty 1
  Filled 2024-02-17: qty 2
  Filled 2024-02-17: qty 0.06
  Filled 2024-02-17: qty 1

## 2024-02-17 MED ORDER — ASPIRIN 81 MG PO TBEC
81.0000 mg | DELAYED_RELEASE_TABLET | Freq: Every day | ORAL | Status: DC
  Administered 2024-02-17 – 2024-02-22 (×6): 81 mg via ORAL
  Filled 2024-02-17 (×6): qty 1

## 2024-02-17 MED ORDER — POTASSIUM CHLORIDE 10 MEQ/100ML IV SOLN
10.0000 meq | INTRAVENOUS | Status: AC
  Administered 2024-02-17 (×2): 10 meq via INTRAVENOUS
  Filled 2024-02-17 (×2): qty 100

## 2024-02-17 NOTE — ED Notes (Addendum)
 RN called informing 4W that patient is on his way up.

## 2024-02-17 NOTE — Progress Notes (Signed)
 MEWS Progress Note  Patient Details Name: Terry Young MRN: [id number] DOB: Nov 29, 1970 Today's Date: 02/17/2024   MEWS Flowsheet Documentation:  Assess: MEWS Score Temp: (!) 97.5 F (36.4 C) BP: (!) 79/67 MAP (mmHg): 73 Pulse Rate: 88 ECG Heart Rate: (!) 103 Resp: 18 Level of Consciousness: Alert SpO2: 100 % O2 Device: Room Air Assess: MEWS Score MEWS Temp: 0 MEWS Systolic: 2 MEWS Pulse: 0 MEWS RR: 0 MEWS LOC: 0 MEWS Score: 2 MEWS Score Color: Yellow Assess: SIRS CRITERIA SIRS Temperature : 0 SIRS Respirations : 0 SIRS Pulse: 0 SIRS WBC: 0 SIRS Score Sum : 0 SIRS Temperature : 0 SIRS Pulse: 0 SIRS Respirations : 0 SIRS WBC: 0 SIRS Score Sum : 0 Assess: if the MEWS score is Yellow or Red Were vital signs accurate and taken at a resting state?: Yes Does the patient meet 2 or more of the SIRS criteria?: No MEWS guidelines implemented : Yes, yellow Treat MEWS Interventions: Considered administering scheduled or prn medications/treatments as ordered Take Vital Signs Increase Vital Sign Frequency : Yellow: Q2hr x1, continue Q4hrs until patient remains green for 12hrs Escalate MEWS: Escalate: Yellow: Discuss with charge nurse and consider notifying provider and/or RRT Notify: Charge Nurse/RN Name of Charge Nurse/RN Notified: Administrator, Civil Service Provider Notification Provider Name/Title: Dr. Burnard Cunning Date Provider Notified: 02/17/24 Time Provider Notified: 1224 Method of Notification:  (secure chat) Notification Reason: Critical Result (BP low, yellow MEWS) Provider response: No new orders (asked if he was on fluids) Date of Provider Response: 02/17/24 Time of Provider Response: 1228      Clotilda Daring 02/17/2024, 12:36 PM

## 2024-02-17 NOTE — Plan of Care (Signed)
  Problem: Coping: Goal: Ability to adjust to condition or change in health will improve Outcome: Progressing   Problem: Fluid Volume: Goal: Ability to maintain a balanced intake and output will improve Outcome: Progressing   Problem: Education: Goal: Knowledge of General Education information will improve Description: Including pain rating scale, medication(s)/side effects and non-pharmacologic comfort measures Outcome: Progressing   Problem: Health Behavior/Discharge Planning: Goal: Ability to manage health-related needs will improve Outcome: Progressing   Problem: Clinical Measurements: Goal: Ability to maintain clinical measurements within normal limits will improve Outcome: Progressing

## 2024-02-17 NOTE — H&P (Signed)
 History and Physical    Terry Young FMW:[id number] DOB: May 06, 1970 DOA: 02/16/2024  PCP: [name], MD   Patient coming from: Home   Chief Complaint: Abdominal pain, N/V   HPI: Terry Young is a 53 y.o. male with medical history significant for hypertension, diabetes mellitus, CKD 3A, and chronic CHF with improved EF who presents with abdominal pain, nausea, and vomiting.  Patient was admitted with similar symptoms in September, presentation was suspected secondary to Ozempic  which she had been uptitrating, that he has not been taking that since and reports ongoing symptoms which have worsened significantly over the past couple days.  He denies diarrhea, fever, chills, sick contacts, or recent travel.  ED Course: Upon arrival to the ED, patient is found to be afebrile and saturating well on room air with mild tachycardia and elevated BP.  Labs are most notable for glucose 316, creatinine 1.23, AST 51, ALT 50, total bilirubin 1.6, normal lipase, normal WBC, lactate 4.3, and troponin 61.  CT is negative for acute findings in the abdomen or pelvis.  Patient was given 1.5 L of LR and a dose of Reglan .  Review of Systems:  All other systems reviewed and apart from HPI, are negative.  Past Medical History:  Diagnosis Date   CHF (congestive heart failure) (HCC)    CKD stage 3a, GFR 45-59 ml/min (HCC) 02/17/2024   Diabetes mellitus without complication (HCC)    Hypertension     History reviewed. No pertinent surgical history.  Social History:   reports that he has never smoked. He has never used smokeless tobacco. He reports that he does not drink alcohol and does not use drugs.  Allergies  Allergen Reactions   Lisinopril Cough    Family History  Problem Relation Age of Onset   Atrial fibrillation Neg Hx      Prior to Admission medications   Medication Sig Start Date End Date Taking? Authorizing Provider  acetaminophen  (TYLENOL ) 325 MG tablet Take 325 mg by mouth as needed  for mild pain (pain score 1-3) or moderate pain (pain score 4-6).    [provider]  aspirin  81 MG EC tablet Take 1 tablet (81 mg total) by mouth daily. 01/31/21   Patsy Lenis, MD  atorvastatin  (LIPITOR) 40 MG tablet Take 1 tablet (40 mg total) by mouth daily. 10/30/23   Bensimhon, Toribio SAUNDERS, MD  carvedilol  (COREG ) 12.5 MG tablet Take 1 tablet (12.5 mg total) by mouth 2 (two) times daily with a meal. 10/30/23   Bensimhon, Toribio SAUNDERS, MD  empagliflozin  (JARDIANCE ) 10 MG TABS tablet Take 1 tablet (10 mg total) by mouth daily before breakfast. 10/30/23   Bensimhon, Toribio SAUNDERS, MD  furosemide  (LASIX ) 40 MG tablet Take 1 tablet (40 mg total) by mouth daily. 10/30/23   Bensimhon, Toribio SAUNDERS, MD  hydrALAZINE  (APRESOLINE ) 25 MG tablet Take 1 tablet (25 mg total) by mouth 3 (three) times daily. 10/30/23   Bensimhon, Toribio SAUNDERS, MD  Insulin  Pen Needle 32G X 4 MM MISC Use to inject insulin  as directed. 01/31/21   Patsy Lenis, MD  isosorbide  mononitrate (IMDUR ) 60 MG 24 hr tablet Take 1 tablet (60 mg total) by mouth daily. 10/30/23   Bensimhon, Toribio SAUNDERS, MD  metFORMIN  (GLUCOPHAGE ) 1000 MG tablet Take 1 tablet (1,000 mg total) by mouth 2 (two) times daily with a meal. Must have office visit for refills 08/16/23   Newlin, Enobong, MD  ondansetron  (ZOFRAN ) 4 MG tablet Take 1 tablet (4 mg total) by mouth every  8 (eight) hours as needed for nausea or vomiting. 01/29/24   Fleming, Zelda W, NP  pregabalin  (LYRICA ) 75 MG capsule Take 1 capsule (75 mg total) by mouth 2 (two) times daily. 01/29/24   Fleming, Zelda W, NP  sacubitril -valsartan  (ENTRESTO ) 97-103 MG Take 1 tablet by mouth 2 (two) times daily. Please call  office to schedule an appointment 10/17/23   Bensimhon, Toribio SAUNDERS, MD  Semaglutide , 2 MG/DOSE, 8 MG/3ML SOPN Inject 2 mg as directed once a week. 10/18/23   [name], MD    Physical Exam: Vitals:   02/16/24 2103 02/17/24 0109  BP: (!) 163/133 (!) 144/116  Pulse: (!) 110 (!) 109  Resp: 16 18  Temp:  98.4 F (36.9 C) 99.2 F (37.3 C)  TempSrc: Oral Oral  SpO2: 100% 100%    Constitutional: NAD, calm  Eyes: PERTLA, lids and conjunctivae normal ENMT: Mucous membranes are moist. Posterior pharynx clear of any exudate or lesions.   Neck: supple, no masses  Respiratory: no wheezing, no crackles. No accessory muscle use.  Cardiovascular: S1 & S2 heard, regular rate and rhythm. Trace ankle edema.  Abdomen: Soft, no guarding, mild generalized tenderness. Bowel sounds active.  Musculoskeletal: no clubbing / cyanosis. No joint deformity upper and lower extremities.   Skin: no significant rashes, lesions, ulcers. Warm, dry, well-perfused. Neurologic: CN 2-12 grossly intact. Moving all extremities. Alert and oriented.  Psychiatric: Pleasant. Cooperative.    Labs and Imaging on Admission: I have personally reviewed following labs and imaging studies  CBC: Recent Labs  Lab 02/16/24 2158  WBC 7.4  NEUTROABS 5.3  HGB 15.3  HCT 46.3  MCV 86.4  PLT 200   Basic Metabolic Panel: Recent Labs  Lab 02/16/24 2158  NA 132*  K 3.8  CL 91*  CO2 23  GLUCOSE 316*  BUN 20  CREATININE 1.23  CALCIUM  9.3  MG 2.0   GFR: CrCl cannot be calculated (Unknown ideal weight.). Liver Function Tests: Recent Labs  Lab 02/16/24 2158  AST 51*  ALT 50*  ALKPHOS 57  BILITOT 1.6*  PROT 7.2  ALBUMIN  4.0   Recent Labs  Lab 02/16/24 2158  LIPASE 49   No results for input(s): AMMONIA in the last 168 hours. Coagulation Profile: No results for input(s): INR, PROTIME in the last 168 hours. Cardiac Enzymes: No results for input(s): CKTOTAL, CKMB, CKMBINDEX, TROPONINI in the last 168 hours. BNP (last 3 results) Recent Labs    08/16/23 1046  PROBNP 99   HbA1C: No results for input(s): HGBA1C in the last 72 hours. CBG: Recent Labs  Lab 02/16/24 2106  GLUCAP 395*   Lipid Profile: No results for input(s): CHOL, HDL, LDLCALC, TRIG, CHOLHDL, LDLDIRECT in the last  72 hours. Thyroid Function Tests: No results for input(s): TSH, T4TOTAL, FREET4, T3FREE, THYROIDAB in the last 72 hours. Anemia Panel: No results for input(s): VITAMINB12, FOLATE, FERRITIN, TIBC, IRON, RETICCTPCT in the last 72 hours. Urine analysis:    Component Value Date/Time   COLORURINE STRAW (A) 02/16/2024 2318   APPEARANCEUR CLEAR 02/16/2024 2318   LABSPEC 1.010 02/16/2024 2318   PHURINE 7.0 02/16/2024 2318   GLUCOSEU >=500 (A) 02/16/2024 2318   HGBUR NEGATIVE 02/16/2024 2318   BILIRUBINUR NEGATIVE 02/16/2024 2318   KETONESUR 20 (A) 02/16/2024 2318   PROTEINUR NEGATIVE 02/16/2024 2318   NITRITE NEGATIVE 02/16/2024 2318   LEUKOCYTESUR NEGATIVE 02/16/2024 2318   Sepsis Labs: @LABRCNTIP (procalcitonin:4,lacticidven:4) )No results found for this or any previous visit (from the past 240 hours).  Radiological Exams on Admission: CT ABDOMEN PELVIS W CONTRAST Result Date: 02/17/2024 CLINICAL DATA:  Abdominal pain. EXAM: CT ABDOMEN AND PELVIS WITH CONTRAST TECHNIQUE: Multidetector CT imaging of the abdomen and pelvis was performed using the standard protocol following bolus administration of intravenous contrast. RADIATION DOSE REDUCTION: This exam was performed according to the departmental dose-optimization program which includes automated exposure control, adjustment of the mA and/or kV according to patient size and/or use of iterative reconstruction technique. CONTRAST:  OMNIPAQUE  IOHEXOL  300 MG/ML  SOLN COMPARISON:  December 30, 2023 FINDINGS: Lower chest: No acute abnormality. Hepatobiliary: There is diffuse fatty infiltration of the liver parenchyma. No focal liver abnormality is seen. No gallstones, gallbladder wall thickening, or biliary dilatation. Pancreas: Unremarkable. No pancreatic ductal dilatation or surrounding inflammatory changes. Spleen: Normal in size without focal abnormality. Adrenals/Urinary Tract: Adrenal glands are unremarkable. Kidneys  are normal, without renal calculi, focal lesion, or hydronephrosis. The ureter bladder is moderately distended and is otherwise unremarkable. Stomach/Bowel: Stomach is within normal limits. Appendix appears normal. No evidence of bowel wall thickening, distention, or inflammatory changes. Vascular/Lymphatic: Aortic atherosclerosis. No enlarged abdominal or pelvic lymph nodes. Reproductive: Prostate is unremarkable. Other: No abdominal wall hernia or abnormality. No abdominopelvic ascites. Musculoskeletal: No acute or significant osseous findings. IMPRESSION: 1. Hepatic steatosis. 2. No acute or active process within the abdomen or pelvis. 3. Aortic atherosclerosis. Electronically Signed   By: Suzen Dials M.D.   On: 02/17/2024 00:20    EKG: Independently reviewed. Sinus or ectopic atrial tachycardia, rate 112, non-specific IVCD with LAD.   Assessment/Plan   1. Intractable abdominal pain and N/V  - No acute findings on CT, treatment of N/V complicated by prolonged QT interval  - Place scopolamine patch, use Tigan as-needed, continue IVF hydration, monitor/correct electrolytes, and advance diet as he improves    2. Lactic acidosis  - No infectious s/s, likely related to hypovolemia and possible metformin  use  - Continue IVF hydration and trend   3. Type II DM  - A1c was >15% in May 2025  - Check CBGs and use SSI    4. Chronic HFimpEF  - EF was 55-60% on echo from September 2025  - Hypovolemic on admission  - Hold Lasix , continue Entresto  and Coreg     5. Hypertension  - Continue Coreg , hydralazine     6. Prolonged QT interval  - Optimize electrolytes, avoid QT-prolonging medications    7. Elevated troponin  - Mildly elevated and decreasing, lower than prior values, and without additional signs/symptoms concerning for ACS  8. CKD 3A  - Appears close to baseline  - Renally-dose medications    DVT prophylaxis: Lovenox   Code Status: Full  Level of Care: Level of care:  Telemetry Family Communication: None present   Disposition Plan:  Patient is from: Home  Anticipated d/c is to: Home  Anticipated d/c date is: 02/18/24  Patient currently: Pending tolerance of adequate oral intake  Consults called: None  Admission status: Observation     Evalene GORMAN Sprinkles, MD [facility]  02/17/2024, 1:27 AM

## 2024-02-17 NOTE — Progress Notes (Signed)
 Patient arrived to his room. Alert and oriented x4. BP low. Otherwise VSS. This nurse notified Dr. Burnard Cunning via secure chat. IV potassium and fluids currently infusing.

## 2024-02-17 NOTE — Progress Notes (Addendum)
 Brief rounding note, same day as admission  HPI: Pt admitted after midnight with abdominal pain, nausea and vomiting.  See H&P for full HPI on admission & ED course.  Pt doing okay this AM, has ongoing nausea. No other complaints    A&P: as per H&P by Dr. Charlton, with any changes or additions as below:  Hypokalemia - K 3.2 --IV K riders ordered to replace --Monitor & replace electrolytes  Hypotension - after arrival to the floor this afernoon, pt's BP noted to be low with MAP < 500 --Given additional fluid bolus --Since he's had 4 L fluids, will give 1 dose midodrine 10 mg --Monitor closely --Maintain MAP > 65 --Hold antihypertensives for now     No charge

## 2024-02-17 NOTE — ED Provider Notes (Signed)
 12:30 AM Care assumed from [place], PA-C at shift change. Patient presenting for intractable N/V. Given Zofran  by EMS. Recently given Reglan . Received 1500cc IVF, but lactate trending up from 3.5 to 4.3. additional 1L LR ordered.   CT notable for hepatic steatosis. No intraabdominal infection, obstruction. Will necessitate admission for symptom control, hydration.   1:31 AM Case discussed with Dr. Charlton who will admit.   Results for orders placed or performed during the hospital encounter of 02/16/24  CBG monitoring, ED   Collection Time: 02/16/24  9:06 PM  Result Value Ref Range   Glucose-Capillary 395 (H) 70 - 99 mg/dL  CBC with Differential   Collection Time: 02/16/24  9:58 PM  Result Value Ref Range   WBC 7.4 4.0 - 10.5 K/uL   RBC 5.36 4.22 - 5.81 MIL/uL   Hemoglobin 15.3 13.0 - 17.0 g/dL   HCT 53.6 60.9 - 47.9 %   MCV 86.4 80.0 - 100.0 fL   MCH 28.5 26.0 - 34.0 pg   MCHC 33.0 30.0 - 36.0 g/dL   RDW 86.4 88.4 - 84.4 %   Platelets 200 150 - 400 K/uL   nRBC 0.0 0.0 - 0.2 %   Neutrophils Relative % 72 %   Neutro Abs 5.3 1.7 - 7.7 K/uL   Lymphocytes Relative 19 %   Lymphs Abs 1.4 0.7 - 4.0 K/uL   Monocytes Relative 9 %   Monocytes Absolute 0.6 0.1 - 1.0 K/uL   Eosinophils Relative 0 %   Eosinophils Absolute 0.0 0.0 - 0.5 K/uL   Basophils Relative 0 %   Basophils Absolute 0.0 0.0 - 0.1 K/uL   Immature Granulocytes 0 %   Abs Immature Granulocytes 0.02 0.00 - 0.07 K/uL  Comprehensive metabolic panel   Collection Time: 02/16/24  9:58 PM  Result Value Ref Range   Sodium 132 (L) 135 - 145 mmol/L   Potassium 3.8 3.5 - 5.1 mmol/L   Chloride 91 (L) 98 - 111 mmol/L   CO2 23 22 - 32 mmol/L   Glucose, Bld 316 (H) 70 - 99 mg/dL   BUN 20 6 - 20 mg/dL   Creatinine, Ser 8.76 0.61 - 1.24 mg/dL   Calcium  9.3 8.9 - 10.3 mg/dL   Total Protein 7.2 6.5 - 8.1 g/dL   Albumin  4.0 3.5 - 5.0 g/dL   AST 51 (H) 15 - 41 U/L   ALT 50 (H) 0 - 44 U/L   Alkaline Phosphatase 57 38 - 126 U/L   Total  Bilirubin 1.6 (H) 0.0 - 1.2 mg/dL   GFR, Estimated >39 >39 mL/min   Anion gap 18 (H) 5 - 15  Lipase, blood   Collection Time: 02/16/24  9:58 PM  Result Value Ref Range   Lipase 49 11 - 51 U/L  Magnesium    Collection Time: 02/16/24  9:58 PM  Result Value Ref Range   Magnesium  2.0 1.7 - 2.4 mg/dL  Blood gas, venous (at [facility] and [facility])   Collection Time: 02/16/24  9:58 PM  Result Value Ref Range   pH, Ven 7.47 (H) 7.25 - 7.43   pCO2, Ven 35 (L) 44 - 60 mmHg   pO2, Ven <31 (LL) 32 - 45 mmHg   Bicarbonate 25.5 20.0 - 28.0 mmol/L   Acid-Base Excess 2.2 (H) 0.0 - 2.0 mmol/L   O2 Saturation 35 %   Patient temperature 37.0   Troponin T, High Sensitivity   Collection Time: 02/16/24  9:58 PM  Result Value Ref Range  Troponin T High Sensitivity 61 (H) 0 - 19 ng/L  I-Stat CG4 Lactic Acid   Collection Time: 02/16/24 10:05 PM  Result Value Ref Range   Lactic Acid, Venous 3.5 (HH) 0.5 - 1.9 mmol/L   Comment NOTIFIED PHYSICIAN   Urinalysis, Routine w reflex microscopic -Urine, Clean Catch   Collection Time: 02/16/24 11:18 PM  Result Value Ref Range   Color, Urine STRAW (A) YELLOW   APPearance CLEAR CLEAR   Specific Gravity, Urine 1.010 1.005 - 1.030   pH 7.0 5.0 - 8.0   Glucose, UA >=500 (A) NEGATIVE mg/dL   Hgb urine dipstick NEGATIVE NEGATIVE   Bilirubin Urine NEGATIVE NEGATIVE   Ketones, ur 20 (A) NEGATIVE mg/dL   Protein, ur NEGATIVE NEGATIVE mg/dL   Nitrite NEGATIVE NEGATIVE   Leukocytes,Ua NEGATIVE NEGATIVE   RBC / HPF 0-5 0 - 5 RBC/hpf   WBC, UA 0-5 0 - 5 WBC/hpf   Bacteria, UA NONE SEEN NONE SEEN   Squamous Epithelial / HPF 0-5 0 - 5 /HPF  I-Stat CG4 Lactic Acid   Collection Time: 02/17/24 12:22 AM  Result Value Ref Range   Lactic Acid, Venous 4.3 (HH) 0.5 - 1.9 mmol/L   Comment NOTIFIED PHYSICIAN    CT ABDOMEN PELVIS W CONTRAST Result Date: 02/17/2024 CLINICAL DATA:  Abdominal pain. EXAM: CT ABDOMEN AND PELVIS WITH CONTRAST TECHNIQUE: Multidetector CT imaging of the abdomen  and pelvis was performed using the standard protocol following bolus administration of intravenous contrast. RADIATION DOSE REDUCTION: This exam was performed according to the departmental dose-optimization program which includes automated exposure control, adjustment of the mA and/or kV according to patient size and/or use of iterative reconstruction technique. CONTRAST:  OMNIPAQUE  IOHEXOL  300 MG/ML  SOLN COMPARISON:  December 30, 2023 FINDINGS: Lower chest: No acute abnormality. Hepatobiliary: There is diffuse fatty infiltration of the liver parenchyma. No focal liver abnormality is seen. No gallstones, gallbladder wall thickening, or biliary dilatation. Pancreas: Unremarkable. No pancreatic ductal dilatation or surrounding inflammatory changes. Spleen: Normal in size without focal abnormality. Adrenals/Urinary Tract: Adrenal glands are unremarkable. Kidneys are normal, without renal calculi, focal lesion, or hydronephrosis. The ureter bladder is moderately distended and is otherwise unremarkable. Stomach/Bowel: Stomach is within normal limits. Appendix appears normal. No evidence of bowel wall thickening, distention, or inflammatory changes. Vascular/Lymphatic: Aortic atherosclerosis. No enlarged abdominal or pelvic lymph nodes. Reproductive: Prostate is unremarkable. Other: No abdominal wall hernia or abnormality. No abdominopelvic ascites. Musculoskeletal: No acute or significant osseous findings. IMPRESSION: 1. Hepatic steatosis. 2. No acute or active process within the abdomen or pelvis. 3. Aortic atherosclerosis. Electronically Signed   By: Suzen Dials M.D.   On: 02/17/2024 00:20      Keith Sor, PA-C 02/17/24 0131    Jerral Meth, MD 02/17/24 [phone]

## 2024-02-18 DIAGNOSIS — R112 Nausea with vomiting, unspecified: Secondary | ICD-10-CM | POA: Diagnosis not present

## 2024-02-18 LAB — MAGNESIUM: Magnesium: 1.7 mg/dL (ref 1.7–2.4)

## 2024-02-18 LAB — BASIC METABOLIC PANEL WITH GFR
Anion gap: 9 (ref 5–15)
BUN: 12 mg/dL (ref 6–20)
CO2: 22 mmol/L (ref 22–32)
Calcium: 7.8 mg/dL — ABNORMAL LOW (ref 8.9–10.3)
Chloride: 104 mmol/L (ref 98–111)
Creatinine, Ser: 0.95 mg/dL (ref 0.61–1.24)
GFR, Estimated: >60 mL/min (ref >60)
Glucose, Bld: 153 mg/dL — ABNORMAL HIGH (ref 70–99)
Potassium: 3.4 mmol/L — ABNORMAL LOW (ref 3.5–5.1)
Sodium: 135 mmol/L (ref 135–145)

## 2024-02-18 LAB — GLUCOSE, CAPILLARY
Glucose-Capillary: 141 mg/dL — ABNORMAL HIGH (ref 70–99)
Glucose-Capillary: 122 mg/dL — ABNORMAL HIGH (ref 70–99)
Glucose-Capillary: 119 mg/dL — ABNORMAL HIGH (ref 70–99)
Glucose-Capillary: 201 mg/dL — ABNORMAL HIGH (ref 70–99)
Glucose-Capillary: 206 mg/dL — ABNORMAL HIGH (ref 70–99)

## 2024-02-18 MED ORDER — CARVEDILOL 3.125 MG PO TABS
3.1250 mg | ORAL_TABLET | Freq: Two times a day (BID) | ORAL | Status: DC
  Administered 2024-02-19 – 2024-02-22 (×5): 3.125 mg via ORAL
  Filled 2024-02-18 (×5): qty 1

## 2024-02-18 MED ORDER — MAGNESIUM SULFATE 2 GM/50ML IV SOLN
2.0000 g | Freq: Once | INTRAVENOUS | Status: DC
  Filled 2024-02-18: qty 50

## 2024-02-18 MED ORDER — POTASSIUM CHLORIDE CRYS ER 20 MEQ PO TBCR
40.0000 meq | EXTENDED_RELEASE_TABLET | Freq: Once | ORAL | Status: AC
  Administered 2024-02-18: 40 meq via ORAL
  Filled 2024-02-18: qty 2

## 2024-02-18 MED ORDER — ORAL CARE MOUTH RINSE
15.0000 mL | OROMUCOSAL | Status: DC | PRN
Start: 2024-02-18 — End: 2024-02-22

## 2024-02-18 NOTE — Progress Notes (Signed)
 Patient request to hold Magnesium  Sulfate IV. He reports experiencing N/V, low BP and visual changes. Provider updated.

## 2024-02-18 NOTE — Progress Notes (Signed)
 Progress Note   Patient: Terry Young FMW:[id number] DOB: 04-07-1971 DOA: 02/16/2024     1 DOS: the patient was seen and examined on 02/18/2024   Brief hospital course: HPI on admission: Justinian Miano is a 53 y.o. male with medical history significant for hypertension, diabetes mellitus, CKD 3A, and chronic CHF with improved EF who presents with abdominal pain, nausea, and vomiting.   Patient was admitted with similar symptoms in September, presentation was suspected secondary to Ozempic  which she had been uptitrating, that he has not been taking that since and reports ongoing symptoms which have worsened significantly over the past couple days.   He denies diarrhea, fever, chills, sick contacts, or recent travel.   ED Course: Upon arrival to the ED, patient is found to be afebrile and saturating well on room air with mild tachycardia and elevated BP.  Labs are most notable for glucose 316, creatinine 1.23, AST 51, ALT 50, total bilirubin 1.6, normal lipase, normal WBC, lactate 4.3, and troponin 61.  CT is negative for acute findings in the abdomen or pelvis.   Patient was given 1.5 L of LR and a dose of Reglan .  Pt was admitted for further evaluation and management as outlined in detail below.  11/2 -- afternoon, pt became hypotensive.  He had been given Entresto , Coreg , hydralazine  and Imdur  in the AM.  BP improved with additional IV fluids and 10 mg midodrine.  11/3 -- BP's still soft, pt feeling weak and lightheaded when up OOB.  Nausea improved. Advance diet to fulls.    Assessment and Plan:  Intractable abdominal pain and N/V  No acute findings on CT, treatment of N/V complicated by prolonged QT interval  11/3 - improved, pt agreeable to adv diet --Scopolamine patch --Tigan as-needed --Off IVF  --Monitor/correct electrolytes --Advance diet to fulls this AM, further advancement as tolerated  Hypotension -- BP as low as 79/67 on afternoon 11/2.  Given additional fluid bolus,  followed by 10 mg midodrine x 1. --Hold parameters on antihypertensives --Entresto  dc'd for now --Maintain MAP > 65 --Monitoring off IV fluids, since he got ~ 4 L  Hypokalemia - K 3.4 --Replace with 40 mEq oral Kcl --Monitor and replace    Lactic acidosis - given IV hydration --Repeat level with AM labs d trend    Type II DM  - A1c was >15% in May 2025  - Sliding scale Novolog  for now - Titrate regimen    Chronic HFimpEF  - EF was 55-60% on echo from September 2025  - Hypovolemic on admission  - Hold Lasix  and Entresto  - Reduced dose of Coreg  to 3.125 mg for now - Hold parameters on meds as above   Hypertension  - Currently Hypotensive - plan as above     Prolonged QT interval  - Optimize electrolytes - Avoid QT-prolonging medications   - 2 g IV Mg sulfate for Mg 1.7 - 40 mEq Kcl for K 3.4 - Serial EKG's to monitor   Elevated troponin  - Mildly elevated and decreasing, lower than prior values, and without additional signs/symptoms concerning for ACS   CKD 3A  - Appears close to baseline  - Renally-dose medications  - Monitor BMP       Subjective: Pt awake sitting up in bed this AM.  Reports no fever/chills.  Nausea has improved, agreeable to try full liquids this AM.  He feels weak and dizzy with BP on low side.    Physical Exam: Vitals:   02/18/24 0059  02/18/24 0500 02/18/24 0510 02/18/24 0857  BP: 97/75  91/66 120/76  Pulse: 90  92 94  Resp:    16  Temp: 97.9 F (36.6 C)  98 F (36.7 C) 98.4 F (36.9 C)  TempSrc: Oral  Oral Oral  SpO2: 99%  99% 99%  Weight:  111.9 kg     General exam: awake, alert, no acute distress HEENT: moist mucus membranes, hearing grossly normal  Respiratory system: on room air, normal respiratory effort. Cardiovascular system: RRR, trace BLE edema.   Gastrointestinal system: soft, non-tender, non-distended Central nervous system: A&O x3. no gross focal neurologic deficits, normal speech Extremities: moves all , no edema,  normal tone Psychiatry: normal mood, congruent affect, judgement and insight appear normal   Data Reviewed:  Notable labs --  K 3.4 Glucose 153 Ca 7.8  Family Communication: None present. Pt updated in detail  Disposition: Status is: Inpatient Remains inpatient appropriate because: correcting electrolytes, advancing diet slowly & monitoring tolerance, hypotension requires close monitoring & medication adjustements   Planned Discharge Destination: Home    Time spent: 45 minutes  Author: Burnard DELENA Cunning, DO 02/18/2024 9:50 AM  For on call review [email or web address].

## 2024-02-18 NOTE — Progress Notes (Signed)
 Coreg  and Hydralazine  held per patient request. See VS, pt reports having intermittent dizziness and weakness and states he is reluctant to take antihypertensives. He request to hold medications.

## 2024-02-18 NOTE — Progress Notes (Signed)
 Patient reports tolerating full liquids, denies vomiting with intermittent episode of nausea. Will advance diet to soft per order. Pt encouraged to contact RN should N/V arise.

## 2024-02-19 DIAGNOSIS — R112 Nausea with vomiting, unspecified: Secondary | ICD-10-CM | POA: Diagnosis not present

## 2024-02-19 LAB — BASIC METABOLIC PANEL WITH GFR
Anion gap: 10 (ref 5–15)
BUN: 10 mg/dL (ref 6–20)
CO2: 22 mmol/L (ref 22–32)
Calcium: 8.1 mg/dL — ABNORMAL LOW (ref 8.9–10.3)
Chloride: 108 mmol/L (ref 98–111)
Creatinine, Ser: 0.91 mg/dL (ref 0.61–1.24)
GFR, Estimated: >60 mL/min (ref >60)
Glucose, Bld: 108 mg/dL — ABNORMAL HIGH (ref 70–99)
Potassium: 3.4 mmol/L — ABNORMAL LOW (ref 3.5–5.1)
Sodium: 139 mmol/L (ref 135–145)

## 2024-02-19 LAB — GLUCOSE, CAPILLARY
Glucose-Capillary: 101 mg/dL — ABNORMAL HIGH (ref 70–99)
Glucose-Capillary: 112 mg/dL — ABNORMAL HIGH (ref 70–99)
Glucose-Capillary: 179 mg/dL — ABNORMAL HIGH (ref 70–99)
Glucose-Capillary: 182 mg/dL — ABNORMAL HIGH (ref 70–99)
Glucose-Capillary: 184 mg/dL — ABNORMAL HIGH (ref 70–99)
Glucose-Capillary: 210 mg/dL — ABNORMAL HIGH (ref 70–99)

## 2024-02-19 LAB — MAGNESIUM: Magnesium: 1.8 mg/dL (ref 1.7–2.4)

## 2024-02-19 LAB — LACTIC ACID, PLASMA: Lactic Acid, Venous: 1.8 mmol/L (ref 0.5–1.9)

## 2024-02-19 MED ORDER — MIDODRINE HCL 5 MG PO TABS
10.0000 mg | ORAL_TABLET | Freq: Once | ORAL | Status: AC
  Administered 2024-02-19: 10 mg via ORAL
  Filled 2024-02-19: qty 2

## 2024-02-19 MED ORDER — ENOXAPARIN SODIUM 60 MG/0.6ML IJ SOSY
50.0000 mg | PREFILLED_SYRINGE | INTRAMUSCULAR | Status: DC
  Administered 2024-02-20: 50 mg via SUBCUTANEOUS
  Filled 2024-02-19: qty 0.6

## 2024-02-19 MED ORDER — LACTATED RINGERS IV SOLN: INTRAVENOUS | Status: DC

## 2024-02-19 MED ORDER — LACTATED RINGERS IV BOLUS
500.0000 mL | Freq: Once | INTRAVENOUS | Status: AC
  Administered 2024-02-19: 500 mL via INTRAVENOUS

## 2024-02-19 MED ORDER — POTASSIUM CHLORIDE CRYS ER 20 MEQ PO TBCR
40.0000 meq | EXTENDED_RELEASE_TABLET | Freq: Once | ORAL | Status: AC
  Administered 2024-02-19: 40 meq via ORAL
  Filled 2024-02-19: qty 2

## 2024-02-19 NOTE — Progress Notes (Signed)
   02/19/24 1608  Assess: MEWS Score  Temp 98 F (36.7 C)  BP (!) 76/53  MAP (mmHg) (!) 61  Pulse Rate (!) 102  Resp 18  SpO2 99 %  Assess: MEWS Score  MEWS Temp 0  MEWS Systolic 2  MEWS Pulse 1  MEWS RR 0  MEWS LOC 0  MEWS Score 3  MEWS Score Color Yellow  Assess: if the MEWS score is Yellow or Red  Were vital signs accurate and taken at a resting state? Yes  Does the patient meet 2 or more of the SIRS criteria? No  MEWS guidelines implemented  Yes, yellow  Treat  MEWS Interventions Considered administering scheduled or prn medications/treatments as ordered  Take Vital Signs  Increase Vital Sign Frequency  Yellow: Q2hr x1, continue Q4hrs until patient remains green for 12hrs  Escalate  MEWS: Escalate Yellow: Discuss with charge nurse and consider notifying provider and/or RRT  Notify: Charge Nurse/RN  Name of Charge Nurse/RN Notified Noemi Cobia RN  Provider Notification  Provider Name/Title Dr. Fausto  Date Provider Notified 02/19/24  Time Provider Notified 1620  Method of Notification Page  Notification Reason Change in status  Provider response See new orders  Date of Provider Response 02/19/24  Time of Provider Response 1621  Assess: SIRS CRITERIA  SIRS Temperature  0  SIRS Respirations  0  SIRS Pulse 1  SIRS WBC 0  SIRS Score Sum  1   BP low, MD made aware, giving fluid bolus, see new orders.

## 2024-02-19 NOTE — Evaluation (Signed)
 Physical Therapy Evaluation Patient Details Name: Terry Young MRN: [id number] DOB: 1970-08-14 Today's Date: 02/19/2024  History of Present Illness  53 yo male  presents to therapy following hospital admission on 02/16/2024 due to abdominal pain with N and V. Pt found to have abn labs including troponin, lactic acidosis, CT of pelvis/abdomen negative for acute findings however revealed hepatic steatosis, and Bp and HR elevated. Pt PMH includes but is not limited to: HTN, DM II, CKD IIIa, and CHF.  Clinical Impression    Pt admitted with above diagnosis.  Pt currently with functional limitations due to the deficits listed below (see PT Problem List). Pt in bed when PT arrived. Pt agreeable to therapy intervention. Pt reports being very weak and limited mobility for weeks prior to hospitalization. Pt pt required S and increased time with use of hospital bed for supine to sit, CGA for sit to stand  from EOB with noted B LE instability, pt reported dizziness with positional changes and positive findings for orthostatic hypotension, please see below and min A for gait tasks 25 feet with RW due to B LE instability, wide BOS, limited stride length and foot clearance with decreased cadence, difficulty placing B LE attributed to neuropathy and cues for safety, posture and RW management. Pt left seated in recliner, all needs in place and nurse tech present.  Pt will benefit from acute skilled PT to increase their independence and safety with mobility to allow discharge.   Bp supine 123/91 (101 PR) Bp seated EOB 127/102 ( 105 PR) Bp immediate standing 101/89 (112 PR) Bp s/p standing 3 min 124/98 (123 PR)       If plan is discharge home, recommend the following: A little help with walking and/or transfers;A little help with bathing/dressing/bathroom;Assistance with cooking/housework;Assist for transportation;Help with stairs or ramp for entrance   Can travel by private vehicle        Equipment  Recommendations Rolling walker (2 wheels)  Recommendations for Other Services       Functional Status Assessment Patient has had a recent decline in their functional status and demonstrates the ability to make significant improvements in function in a reasonable and predictable amount of time.     Precautions / Restrictions Precautions Precautions: Fall (orthostatic) Restrictions Weight Bearing Restrictions Per Provider Order: No      Mobility  Bed Mobility Overal bed mobility: Needs Assistance Bed Mobility: Supine to Sit     Supine to sit: Supervision, HOB elevated, Used rails     General bed mobility comments: min cues and increased time    Transfers Overall transfer level: Needs assistance Equipment used: Rolling walker (2 wheels) Transfers: Sit to/from Stand Sit to Stand: Contact guard assist           General transfer comment: min cues, push to stand with R UE and mild instabiltiy once in standing    Ambulation/Gait Ambulation/Gait assistance: Min assist Gait Distance (Feet): 25 Feet Assistive device: Rolling walker (2 wheels) Gait Pattern/deviations: Step-to pattern, Knee flexed in stance - right, Knee flexed in stance - left, Wide base of support Gait velocity: decreased     General Gait Details: B LE instabiltiy noted with pt requiring CGA to min A due to LOB, pt exhibits decreased stride lenth, minimal foot clearance, and flexed posture  Stairs            Wheelchair Mobility     Tilt Bed    Modified Rankin (Stroke Patients Only)  Balance Overall balance assessment: History of Falls, Needs assistance Sitting-balance support: Feet supported       Standing balance support: Bilateral upper extremity supported, During functional activity, Reliant on assistive device for balance Standing balance-Leahy Scale: Poor Standing balance comment: B LE instability                             Pertinent Vitals/Pain Pain  Assessment Pain Assessment: Faces Faces Pain Scale: Hurts a little bit Pain Location: abdomen Pain Descriptors / Indicators: Sore Pain Intervention(s): Limited activity within patient's tolerance, Monitored during session, Repositioned    Home Living Family/patient expects to be discharged to:: Private residence Living Arrangements: Other relatives Available Help at Discharge: Family Type of Home: Apartment Home Access: Stairs to enter Entrance Stairs-Rails: Can reach Writer of Steps: Flight   Home Layout: One level Home Equipment: None;Grab bars - tub/shower      Prior Function Prior Level of Function : Independent/Modified Independent;Driving             Mobility Comments: IND no AD for all ADLs, self care tasks and IADLs, pt reports driving but no longer working       Extremity/Trunk Assessment        Lower Extremity Assessment Lower Extremity Assessment: Generalized weakness (B distal LE abn sensation secondary to neuropathy)    Cervical / Trunk Assessment Cervical / Trunk Assessment: Normal  Communication   Communication Communication: No apparent difficulties    Cognition Arousal: Alert Behavior During Therapy: WFL for tasks assessed/performed   PT - Cognitive impairments: No apparent impairments                         Following commands: Intact       Cueing       General Comments      Exercises     Assessment/Plan    PT Assessment Patient needs continued PT services  PT Problem List Decreased strength;Decreased activity tolerance;Decreased balance;Decreased mobility;Decreased coordination;Pain;Cardiopulmonary status limiting activity       PT Treatment Interventions DME instruction;Gait training;Stair training;Functional mobility training;Therapeutic activities;Therapeutic exercise;Balance training;Neuromuscular re-education;Patient/family education    PT Goals (Current goals can be found in  the Care Plan section)  Acute Rehab PT Goals Patient Stated Goal: to be able to get stronger go home and start cooking again PT Goal Formulation: With patient Time For Goal Achievement: 03/04/24 Potential to Achieve Goals: Good    Frequency Min 3X/week     Co-evaluation               AM-PAC PT 6 Clicks Mobility  Outcome Measure Help needed turning from your back to your side while in a flat bed without using bedrails?: None Help needed moving from lying on your back to sitting on the side of a flat bed without using bedrails?: None Help needed moving to and from a bed to a chair (including a wheelchair)?: A Little Help needed standing up from a chair using your arms (e.g., wheelchair or bedside chair)?: A Little Help needed to walk in hospital room?: A Little Help needed climbing 3-5 steps with a railing? : Total 6 Click Score: 18    End of Session Equipment Utilized During Treatment: Gait belt Activity Tolerance: Patient limited by fatigue Patient left: in chair;with call bell/phone within reach;with nursing/sitter in room Nurse Communication: Mobility status PT Visit Diagnosis: Unsteadiness on feet (R26.81);Other abnormalities of gait and  mobility (R26.89);Muscle weakness (generalized) (M62.81);History of falling (Z91.81);Difficulty in walking, not elsewhere classified (R26.2);Pain Pain - part of body:  (abdomen)    Time: 8948-8879 PT Time Calculation (min) (ACUTE ONLY): 29 min   Charges:   PT Evaluation $PT Eval Low Complexity: 1 Low PT Treatments $Gait Training: 8-22 mins PT General Charges $$ ACUTE PT VISIT: 1 Visit         [place], PT Acute Rehab   [place] VEAR Drone 02/19/2024, 11:31 AM

## 2024-02-19 NOTE — Progress Notes (Signed)
 Progress Note   Patient: Terry Young FMW:[id number] DOB: 1970-05-11 DOA: 02/16/2024     2 DOS: the patient was seen and examined on 02/19/2024   Brief hospital course: HPI on admission: Terry Young is a 53 y.o. male with medical history significant for hypertension, diabetes mellitus, CKD 3A, and chronic CHF with improved EF who presents with abdominal pain, nausea, and vomiting.   Patient was admitted with similar symptoms in September, presentation was suspected secondary to Ozempic  which she had been uptitrating, that he has not been taking that since and reports ongoing symptoms which have worsened significantly over the past couple days.   He denies diarrhea, fever, chills, sick contacts, or recent travel.   ED Course: Upon arrival to the ED, patient is found to be afebrile and saturating well on room air with mild tachycardia and elevated BP.  Labs are most notable for glucose 316, creatinine 1.23, AST 51, ALT 50, total bilirubin 1.6, normal lipase, normal WBC, lactate 4.3, and troponin 61.  CT is negative for acute findings in the abdomen or pelvis.   Patient was given 1.5 L of LR and a dose of Reglan .  Pt was admitted for further evaluation and management as outlined in detail below.  11/2 -- afternoon, pt became hypotensive.  He had been given Entresto , Coreg , hydralazine  and Imdur  in the AM.  BP improved with additional IV fluids and 10 mg midodrine.  11/3 -- BP's still soft, pt feeling weak and lightheaded when up OOB.  Nausea improved. Advance diet to fulls.    Assessment and Plan:  Intractable abdominal pain and N/V  No acute findings on CT, treatment of N/V complicated by prolonged QT interval  11/3 - improved, pt agreeable to adv diet --Scopolamine patch --Tigan as-needed --Off IVF  --Monitor/correct electrolytes --Advance diet to fulls this AM, further advancement as tolerated  Hypotension -- BP as low as 79/67 on afternoon 11/2.  Given additional fluid bolus,  followed by 10 mg midodrine x 1. --Reduced Coreg  dose --Hold parameters on antihypertensives --Entresto  dc'd for now --Lasix  on hold --Maintain MAP > 65 --Monitoring off IV fluids, since he got ~ 4 L -- Check orthostatic vitals daily  Hypokalemia - K 3.4 --Replace with 40 mEq oral Kcl --Monitor and replace    Lactic acidosis - given IV hydration -- Resolved   Type II DM  - A1c was >15% in May 2025  - Sliding scale Novolog  for now - Titrate regimen    Chronic HFimpEF - Hypovolemic on admission  - EF was 55-60% on echo from September 2025  - Holding Lasix  and Entresto  - Reduced dose of Coreg  to 3.125 mg for now - Hold parameters on meds as above -Monitor volume status   Hypertension  Patient was hypotensive has improved with holding or reducing meds ---medication changes as above     Prolonged QT interval  - Optimize electrolytes - Avoid QT-prolonging medications   - Serial EKG's to monitor   Elevated troponin  - Mildly elevated and decreasing, lower than prior values, and without additional signs/symptoms concerning for ACS   CKD 3A -stable at baseline - Renally-dose medications  - Monitor BMP   Generalized weakness -possibly due to softer BPs and recovering from acute illness -- PT evaluation -- Fall precautions      Subjective: Pt awake sitting up in bed this AM.  He reports ongoing dizziness at rest but worse when attempting to get up.  He refills extremely weak.  Denies any further  vomiting but did have diarrhea yesterday.  No fevers chills or other acute complaints agreeable to see PT today   Physical Exam: Vitals:   02/18/24 2305 02/19/24 0056 02/19/24 0436 02/19/24 0512  BP:  (!) 139/99 128/87   Pulse:  92 88   Resp: 12 18 16 17   Temp:  97.9 F (36.6 C) 97.6 F (36.4 C)   TempSrc:  Oral Oral   SpO2:  100% 99%   Weight:    110.7 kg  Height:       General exam: awake, alert, no acute distress HEENT: moist mucus membranes, hearing grossly normal   Respiratory system: on room air, normal respiratory effort.  Lungs clear no wheezes or rhonchi Cardiovascular system: RRR, trace BLE edema.   Gastrointestinal system: soft, non-tender, non-distended Central nervous system: A&O x3. no gross focal neurologic deficits, normal speech Extremities: moves all , no edema, onychomycosis Psychiatry: normal mood, congruent affect, judgement and insight appear normal   Data Reviewed:  Notable labs --  K 3.4 Glucose 108 Ca 8.1  Family Communication: None present. Pt updated in detail  Disposition: Status is: Inpatient Remains inpatient appropriate because: Persistent dizziness and generalized weakness needing PT evaluation.  Nausea vomiting has resolved   Planned Discharge Destination: Home    Time spent: 42 minutes  Author: Burnard DELENA Cunning, DO 02/19/2024 11:56 AM  For on call review [email or web address].

## 2024-02-20 ENCOUNTER — Encounter (HOSPITAL_COMMUNITY): Payer: Self-pay | Admitting: Family Medicine

## 2024-02-20 ENCOUNTER — Inpatient Hospital Stay (HOSPITAL_COMMUNITY)

## 2024-02-20 DIAGNOSIS — E66812 Obesity, class 2: Secondary | ICD-10-CM | POA: Diagnosis not present

## 2024-02-20 DIAGNOSIS — R112 Nausea with vomiting, unspecified: Secondary | ICD-10-CM | POA: Diagnosis not present

## 2024-02-20 DIAGNOSIS — B961 Klebsiella pneumoniae [K. pneumoniae] as the cause of diseases classified elsewhere: Secondary | ICD-10-CM

## 2024-02-20 DIAGNOSIS — R7881 Bacteremia: Secondary | ICD-10-CM | POA: Diagnosis not present

## 2024-02-20 DIAGNOSIS — N1831 Chronic kidney disease, stage 3a: Secondary | ICD-10-CM | POA: Diagnosis not present

## 2024-02-20 DIAGNOSIS — I5032 Chronic diastolic (congestive) heart failure: Secondary | ICD-10-CM | POA: Diagnosis not present

## 2024-02-20 LAB — BLOOD CULTURE ID PANEL (REFLEXED) - BCID2

## 2024-02-20 LAB — GLUCOSE, CAPILLARY
Glucose-Capillary: 135 mg/dL — ABNORMAL HIGH (ref 70–99)
Glucose-Capillary: 135 mg/dL — ABNORMAL HIGH (ref 70–99)
Glucose-Capillary: 156 mg/dL — ABNORMAL HIGH (ref 70–99)
Glucose-Capillary: 161 mg/dL — ABNORMAL HIGH (ref 70–99)
Glucose-Capillary: 171 mg/dL — ABNORMAL HIGH (ref 70–99)
Glucose-Capillary: 206 mg/dL — ABNORMAL HIGH (ref 70–99)

## 2024-02-20 LAB — ECHOCARDIOGRAM COMPLETE
Area-P 1/2: 7.16 cm2
Calc EF: 48.4 %
Height: 70 in
S' Lateral: 3.7 cm
Single Plane A2C EF: 50.5 %
Single Plane A4C EF: 45.3 %
Weight: 3925.95 [oz_av]

## 2024-02-20 LAB — BASIC METABOLIC PANEL WITH GFR
Anion gap: 11 (ref 5–15)
BUN: 10 mg/dL (ref 6–20)
CO2: 21 mmol/L — ABNORMAL LOW (ref 22–32)
Calcium: 8.3 mg/dL — ABNORMAL LOW (ref 8.9–10.3)
Chloride: 106 mmol/L (ref 98–111)
Creatinine, Ser: 1.36 mg/dL — ABNORMAL HIGH (ref 0.61–1.24)
GFR, Estimated: >60 mL/min (ref >60)
Glucose, Bld: 165 mg/dL — ABNORMAL HIGH (ref 70–99)
Potassium: 3.9 mmol/L (ref 3.5–5.1)
Sodium: 138 mmol/L (ref 135–145)

## 2024-02-20 MED ORDER — SODIUM CHLORIDE 0.9 % IV SOLN
2.0000 g | INTRAVENOUS | Status: DC
  Administered 2024-02-20 – 2024-02-22 (×3): 2 g via INTRAVENOUS
  Filled 2024-02-20 (×3): qty 20

## 2024-02-20 MED ORDER — PERFLUTREN LIPID MICROSPHERE
1.0000 mL | INTRAVENOUS | Status: DC | PRN
  Administered 2024-02-20: 2 mL via INTRAVENOUS

## 2024-02-20 MED ORDER — ENOXAPARIN SODIUM 60 MG/0.6ML IJ SOSY
50.0000 mg | PREFILLED_SYRINGE | INTRAMUSCULAR | Status: DC
  Administered 2024-02-21 – 2024-02-22 (×2): 50 mg via SUBCUTANEOUS
  Filled 2024-02-20 (×2): qty 0.6

## 2024-02-20 MED ORDER — INSULIN ASPART 100 UNIT/ML IJ SOLN
0.0000 [IU] | Freq: Three times a day (TID) | INTRAMUSCULAR | Status: DC
  Administered 2024-02-21: 2 [IU] via SUBCUTANEOUS
  Administered 2024-02-21: 3 [IU] via SUBCUTANEOUS
  Administered 2024-02-21 – 2024-02-22 (×2): 2 [IU] via SUBCUTANEOUS
  Filled 2024-02-20: qty 3
  Filled 2024-02-20 (×3): qty 2

## 2024-02-20 MED ORDER — INSULIN ASPART 100 UNIT/ML IJ SOLN
0.0000 [IU] | Freq: Every day | INTRAMUSCULAR | Status: DC
  Administered 2024-02-21: 2 [IU] via SUBCUTANEOUS
  Filled 2024-02-20: qty 2

## 2024-02-20 NOTE — Assessment & Plan Note (Addendum)
 02/20/24 Creatinine 1.36 today.  Stable.  02/21/24 Scr down to 1.08 today. Stop IVF.  02/22/24 stable.  Creatinine 1.08 yesterday.

## 2024-02-20 NOTE — Assessment & Plan Note (Addendum)
 02/20/24 unclear the exact cause of his intractable nausea vomiting.  He is having problems with accommodation due to the scopolamine patch along with difficulty walking, orthostasis and dry mouth.  Will go ahead and stop his scopolamine patch.  Continue with IV fluids for now.  02/21/24 resolved. Stop IVF.

## 2024-02-20 NOTE — Progress Notes (Signed)
  Echocardiogram 2D Echocardiogram has been performed.  Devora Ellouise SAUNDERS 02/20/2024, 10:25 AM

## 2024-02-20 NOTE — Progress Notes (Signed)
 Physical Therapy Treatment Patient Details Name: Terry Young MRN: [id number] DOB: [birth year] Today's Date: 02/20/2024   History of Present Illness 53 yo male  presents to therapy following hospital admission on 02/16/2024 due to abdominal pain with N and V. Pt found to have abn labs including troponin, lactic acidosis, CT of pelvis/abdomen negative for acute findings however revealed hepatic steatosis, and Bp and HR elevated. Pt PMH includes but is not limited to: HTN, DM II, CKD IIIa, and CHF.    PT Comments   Pt admitted with above diagnosis.  Pt currently with functional limitations due to the deficits listed below (see PT Problem List). Pt in bed when PT arrived. Pt agreeable to therapy intervention. Pt reported Bp went really low yesterday. Pt stated he was able to sit up in the recliner for a majority of the day s/p PT eval. Pt indicates ongoing abdominal discomfort. Pt required increased time use of hospital bed and S for supine to sit with cues, CGA for sit to stand  from EOB, CGA for standing balance 4:45 for orthostatic hypotension assessment, please see below, pt indicated dizziness with supine to sit not with sit to stand today, gait assessed with RW, CGA and min cues for 35 feet improved B LE stability with pt reporting weakness. MD present for a portion of PT intervention. Pt left seated in recliner and all needs in place. Pt will benefit from acute skilled PT to increase their independence and safety with mobility to allow discharge.   Bp supiene 142/93 (93 PR) Bp seated EOB 161/111 (104 PR) Bp immediate standing 142/107 (105 PR) Bp s/p 3 min standing 103/85 (107 PR)   If plan is discharge home, recommend the following: A little help with walking and/or transfers;A little help with bathing/dressing/bathroom;Assistance with cooking/housework;Assist for transportation;Help with stairs or ramp for entrance   Can travel by private vehicle        Equipment Recommendations  Rolling walker  (2 wheels)    Recommendations for Other Services       Precautions / Restrictions Precautions Precautions: Fall (orthostatic) Restrictions Weight Bearing Restrictions Per Provider Order: No     Mobility  Bed Mobility Overal bed mobility: Needs Assistance Bed Mobility: Supine to Sit     Supine to sit: Supervision, HOB elevated, Used rails     General bed mobility comments: min cues and increased time    Transfers Overall transfer level: Needs assistance Equipment used: Rolling walker (2 wheels) Transfers: Sit to/from Stand, Bed to chair/wheelchair/BSC Sit to Stand: Contact guard assist Stand pivot transfers: Contact guard assist         General transfer comment: min cues, push to stand with R UE    Ambulation/Gait Ambulation/Gait assistance: Contact guard assist Gait Distance (Feet): 35 Feet Assistive device: Rolling walker (2 wheels) Gait Pattern/deviations: Step-to pattern, Knee flexed in stance - right, Knee flexed in stance - left, Wide base of support Gait velocity: decreased     General Gait Details: pt demonstrated improved B LE stabiltiy with gait tasks today and able to increase gait tolerance,  pt exhibits decreased stride lenth, minimal foot clearance, and flexed posture   Stairs             Wheelchair Mobility     Tilt Bed    Modified Rankin (Stroke Patients Only)       Balance Overall balance assessment: History of Falls, Needs assistance Sitting-balance support: Feet supported       Standing balance support: Bilateral upper  extremity supported, During functional activity, Reliant on assistive device for balance Standing balance-Leahy Scale: Poor Standing balance comment: occational B LE instability no overt LOB                            Communication Communication Communication: No apparent difficulties  Cognition Arousal: Alert Behavior During Therapy: WFL for tasks assessed/performed   PT - Cognitive  impairments: No apparent impairments                         Following commands: Intact      Cueing    Exercises      General Comments        Pertinent Vitals/Pain Pain Assessment Pain Assessment: Faces Faces Pain Scale: Hurts a little bit Pain Location: abdomen Pain Descriptors / Indicators: Sore Pain Intervention(s): Limited activity within patient's tolerance, Monitored during session    Home Living                          Prior Function            PT Goals (current goals can now be found in the care plan section) Acute Rehab PT Goals Patient Stated Goal: to be able to get stronger go home and start cooking again PT Goal Formulation: With patient Time For Goal Achievement: 03/04/24 Potential to Achieve Goals: Good Progress towards PT goals: Progressing toward goals    Frequency    Min 3X/week      PT Plan      Co-evaluation              AM-PAC PT 6 Clicks Mobility   Outcome Measure  Help needed turning from your back to your side while in a flat bed without using bedrails?: None Help needed moving from lying on your back to sitting on the side of a flat bed without using bedrails?: None Help needed moving to and from a bed to a chair (including a wheelchair)?: A Little Help needed standing up from a chair using your arms (e.g., wheelchair or bedside chair)?: A Little Help needed to walk in hospital room?: A Little Help needed climbing 3-5 steps with a railing? : Total 6 Click Score: 18    End of Session Equipment Utilized During Treatment: Gait belt Activity Tolerance: Patient limited by fatigue Patient left: in chair;with call bell/phone within reach Nurse Communication: Mobility status PT Visit Diagnosis: Unsteadiness on feet (R26.81);Other abnormalities of gait and mobility (R26.89);Muscle weakness (generalized) (M62.81);History of falling (Z91.81);Difficulty in walking, not elsewhere classified (R26.2);Pain Pain -  part of body:  (abdomen)     Time: 8887-8855 PT Time Calculation (min) (ACUTE ONLY): 32 min  Charges:    $Gait Training: 8-22 mins $Therapeutic Activity: 8-22 mins PT General Charges $$ ACUTE PT VISIT: 1 Visit                     [place], PT Acute Rehab    [place] VEAR Drone 02/20/2024, 1:21 PM

## 2024-02-20 NOTE — Assessment & Plan Note (Addendum)
 02/20/24 continue with sliding scale insulin .  02/21/24 stable. On SSI.  02/22/24 stable.  He can restart his metformin , Jardiance  at home.

## 2024-02-20 NOTE — Assessment & Plan Note (Signed)
 Body mass index is 35.21 kg/m.

## 2024-02-20 NOTE — Hospital Course (Addendum)
 CC: N/V HPI: Terry Young is a 53 y.o. male with medical history significant for hypertension, diabetes mellitus, CKD 3A, and chronic CHF with improved EF who presents with abdominal pain, nausea, and vomiting.   Patient was admitted with similar symptoms in September, presentation was suspected secondary to Ozempic  which she had been uptitrating, that he has not been taking that since and reports ongoing symptoms which have worsened significantly over the past couple days.   He denies diarrhea, fever, chills, sick contacts, or recent travel.   ED Course: Upon arrival to the ED, patient is found to be afebrile and saturating well on room air with mild tachycardia and elevated BP.  Labs are most notable for glucose 316, creatinine 1.23, AST 51, ALT 50, total bilirubin 1.6, normal lipase, normal WBC, lactate 4.3, and troponin 61.  CT is negative for acute findings in the abdomen or pelvis.   Patient was given 1.5 L of LR and a dose of Reglan .  Significant Events: Admitted 02/16/2024 intractable N/V 11/2 -- afternoon, pt became hypotensive.  He had been given Entresto , Coreg , hydralazine  and Imdur  in the AM.  BP improved with additional IV fluids and 10 mg midodrine.  11/3 -- BP's still soft, pt feeling weak and lightheaded when up OOB.  Nausea improved. Advance diet to fulls.    Admission Labs: WBC 7.4, HgB 15.3, plt 200 Na 132, K 3.8, CO2 of 23, Bun 20, Scr 1.23, glu 316 Lipase 49 Mg 2.0 VBG pH 7.47, PCO2 of 35 Lactic acid 3.5  Admission Imaging Studies: CT abd/pelvis Hepatic steatosis. 2. No acute or active process within the abdomen or pelvis. 3. Aortic atherosclerosis  Significant Labs:   Significant Imaging Studies: Echo today shows 60 to 65%   Antibiotic Therapy: Anti-infectives (From admission, onward)    None       Procedures:   Consultants:

## 2024-02-20 NOTE — Assessment & Plan Note (Addendum)
 02/20/24 repeat EKG.  Potassium is 3.9 and his magnesium  is 1.8.  02/21/24 QTC still 557 msec. But improved

## 2024-02-20 NOTE — Assessment & Plan Note (Addendum)
 02/20/24  Unclear where his Klebsiella pneumonia a bacteremia is from.  Started on IV Rocephin.  His CT abdomen pelvis was negative for any acute pathology.  He is on room air.  I do not think he has pneumonia.  Urinalysis was not impressive for acute cystitis.  02/21/24 pt feeling better. Unclear the source of his gram negative septicemia. Awaiting final MIC. Pt states he has been sick since September 2025. I doubt he has been septicemic for 2 months.   02/22/24 His Klebsiella pneumoniae septicemia MIC's are back. Sensitive to Ancef and Rocephin. He can safely transition to p.o. Duricef.  Source is still unknown.  CT abdomen pelvis was negative for any intra-abdominal pathology.  His chest x-ray was negative for pneumonia.  Will treat him with a total 10-day course of antibiotics including his IV Rocephin he received in the hospital.  Susceptibility data from last 90 days. Collected Specimen Info Organism AMPICILLIN AMPICILLIN/SULBACTAM CEFAZOLIN (NON-URINE) CEFEPIME CEFTRIAXONE Ciprofloxacin Ertapenem Gentamicin Susc lslt Meropenem Piperacillin + Tazobactam Trimethoprim/Sulfa  02/19/24 Blood from BLOOD RIGHT ARM Klebsiella pneumoniae  R  S  S  S  S  S  S  S  S S  S

## 2024-02-20 NOTE — Progress Notes (Signed)
 PHARMACY - PHYSICIAN COMMUNICATION CRITICAL VALUE ALERT - BLOOD CULTURE IDENTIFICATION (BCID)  Terry Young is an 53 y.o. male who presented to [facility] on 02/16/2024 with a chief complaint of abdominal pain, N/V  Assessment:  kleb pneumo bacteremia  Name of physician (or Provider) Contacted: Laurence  Current antibiotics: none  Changes to prescribed antibiotics recommended:  Start Rocephin 2g IV q24  Results for orders placed or performed during the hospital encounter of 02/16/24  Blood Culture ID Panel (Reflexed) (Collected: 02/19/2024  5:40 PM)  Result Value Ref Range   Enterococcus faecalis NOT DETECTED NOT DETECTED   Enterococcus Faecium NOT DETECTED NOT DETECTED   Listeria monocytogenes NOT DETECTED NOT DETECTED   Staphylococcus species NOT DETECTED NOT DETECTED   Staphylococcus aureus (BCID) NOT DETECTED NOT DETECTED   Staphylococcus epidermidis NOT DETECTED NOT DETECTED   Staphylococcus lugdunensis NOT DETECTED NOT DETECTED   Streptococcus species NOT DETECTED NOT DETECTED   Streptococcus agalactiae NOT DETECTED NOT DETECTED   Streptococcus pneumoniae NOT DETECTED NOT DETECTED   Streptococcus pyogenes NOT DETECTED NOT DETECTED   A.calcoaceticus-baumannii NOT DETECTED NOT DETECTED   Bacteroides fragilis NOT DETECTED NOT DETECTED   Enterobacterales DETECTED (A) NOT DETECTED   Enterobacter cloacae complex NOT DETECTED NOT DETECTED   Escherichia coli NOT DETECTED NOT DETECTED   Klebsiella aerogenes NOT DETECTED NOT DETECTED   Klebsiella oxytoca NOT DETECTED NOT DETECTED   Klebsiella pneumoniae DETECTED (A) NOT DETECTED   Proteus species NOT DETECTED NOT DETECTED   Salmonella species NOT DETECTED NOT DETECTED   Serratia marcescens NOT DETECTED NOT DETECTED   Haemophilus influenzae NOT DETECTED NOT DETECTED   Neisseria meningitidis NOT DETECTED NOT DETECTED   Pseudomonas aeruginosa NOT DETECTED NOT DETECTED   Stenotrophomonas maltophilia NOT DETECTED NOT DETECTED   Candida  albicans NOT DETECTED NOT DETECTED   Candida auris NOT DETECTED NOT DETECTED   Candida glabrata NOT DETECTED NOT DETECTED   Candida krusei NOT DETECTED NOT DETECTED   Candida parapsilosis NOT DETECTED NOT DETECTED   Candida tropicalis NOT DETECTED NOT DETECTED   Cryptococcus neoformans/gattii NOT DETECTED NOT DETECTED   CTX-M ESBL NOT DETECTED NOT DETECTED   Carbapenem resistance IMP NOT DETECTED NOT DETECTED   Carbapenem resistance KPC NOT DETECTED NOT DETECTED   Carbapenem resistance NDM NOT DETECTED NOT DETECTED   Carbapenem resist OXA 48 LIKE NOT DETECTED NOT DETECTED   Carbapenem resistance VIM NOT DETECTED NOT DETECTED    Britta Eva Na 02/20/2024  12:32 PM

## 2024-02-20 NOTE — Subjective & Objective (Signed)
 Pt seen and examined. His vision changes are resolved now that scopolamine patch was removed. Pt able to eat with N/v. On IV rocephin awaiting final MIC. Walked better today. No diarrhea.

## 2024-02-20 NOTE — Assessment & Plan Note (Addendum)
 02/20/24 appears euvolemic.  Continue on Coreg , Imdur .  Holding on hydralazine  and Lasix  due to orthostasis today. Echo today shows 60 to 65%   02/21/24 stable. Is no longer orthostatic. Continue with just coreg  and imdur . Still holding lasix  and hydralazine .  02/22/24 Echo showed normal LVEF of 60 to 65%.  Remains on Imdur  and Coreg .  Will have him hold his Lasix  and his hydralazine  until he is off of antibiotics.  Also holding his Entresto  while he is on antibiotics.  He will need to see his cardiologist or PCP back in the office before he restarts these.

## 2024-02-20 NOTE — TOC Initial Note (Signed)
 Transition of Care [facility]) - Initial/Assessment Note    Patient Details  Name: Terry Young MRN: [id number] Date of Birth: 11/21/1970  [facility] CM/SW Contact:    Tawni CHRISTELLA Eva, LCSW Phone Number: 02/20/2024, 12:32 PM  Clinical Narrative:                  CSW met with pt at bedside,  pt was rec for home health services at this time CSW is unable to secure home health services. CSW provided pt an update. Pt is rec for rolling walker. Pt is agreeable, referral sent to [employer]  for walker will be delivered to pt's room prior to d/c. Care management sign off.   Expected Discharge Plan: Home/Self Care Barriers to Discharge: Continued Medical Work up   Patient Goals and CMS Choice Patient states their goals for this hospitalization and ongoing recovery are:: retrun home          Expected Discharge Plan and Services       Living arrangements for the past 2 months: Apartment                                      Prior Living Arrangements/Services Living arrangements for the past 2 months: Apartment Lives with:: Self Patient language and need for interpreter reviewed:: Yes Do you feel safe going back to the place where you live?: Yes      Need for Family Participation in Patient Care: No (Comment) Care giver support system in place?: No (comment)   Criminal Activity/Legal Involvement Pertinent to Current Situation/Hospitalization: No - Comment as needed  Activities of Daily Living   ADL Screening (condition at time of admission) Independently performs ADLs?: Yes (appropriate for developmental age) Is the patient deaf or have difficulty hearing?: No Does the patient have difficulty seeing, even when wearing glasses/contacts?: No Does the patient have difficulty concentrating, remembering, or making decisions?: No  Permission Sought/Granted                  Emotional Assessment Appearance:: Appears stated age Attitude/Demeanor/Rapport:  Gracious Affect (typically observed): Accepting Orientation: : Oriented to Self, Oriented to Place, Oriented to  Time, Oriented to Situation   Psych Involvement: No (comment)  Admission diagnosis:  Intractable nausea and vomiting [R11.2] Patient Active Problem List   Diagnosis Date Noted   CKD stage 3a, GFR 45-59 ml/min (HCC) 02/17/2024   Prolonged QT interval 02/17/2024   Elevated troponin 02/17/2024   Intractable nausea and vomiting 01/01/2024   Myocardial injury 12/30/2023   Hypokalemia 01/29/2021   Morbid obesity (HCC) 01/29/2021   Heart failure with improved ejection fraction (HFimpEF) (HCC) 01/27/2021   Acute on chronic combined systolic and diastolic CHF (congestive heart failure) (HCC) 01/26/2021   DM2 (diabetes mellitus, type 2) (HCC) 01/26/2021   Hypertensive urgency 01/26/2021   PCP:  [name], MD Pharmacy:   [facility] [address] [place] [address] Phone: [phone] Fax: [phone]  [facility] [address] [place] [address] Phone: [phone] Fax: [phone]     Social Drivers of Health (SDOH) Social History: SDOH Screenings   Food Insecurity: No Food Insecurity (02/17/2024)  Housing: Low Risk  (02/17/2024)  Transportation Needs: No Transportation Needs (02/17/2024)  Utilities: Not At Risk (02/17/2024)  Depression (PHQ2-9): Low Risk  (11/19/2023)  Financial Resource Strain:  High Risk (02/07/2021)  Tobacco Use: Low Risk  (02/17/2024)   SDOH Interventions:     Readmission Risk Interventions    01/01/2024    3:19 PM  Readmission Risk Prevention Plan  Medication Screening Complete  Transportation Screening Complete

## 2024-02-20 NOTE — Progress Notes (Signed)
 PROGRESS NOTE    Terry Young  FMW:[id number] DOB: 05-31-1970 DOA: 02/16/2024 PCP: [name], MD  Subjective: Patient seen and examined.  He was working with physical therapy.  Still having some difficulty with walking.  He complains that he is having difficulty with vision, accommodation, dry mouth.  I think these are side effects from his scopolamine patch.  He is tolerating a soft diet without any nausea or vomiting.  I was informed later by the pharmacist that his blood cultures came back with Klebsiella pneumoniae.  Started him on IV Rocephin.   Hospital Course: CC: N/V HPI: Terry Young is a 53 y.o. male with medical history significant for hypertension, diabetes mellitus, CKD 3A, and chronic CHF with improved EF who presents with abdominal pain, nausea, and vomiting.   Patient was admitted with similar symptoms in September, presentation was suspected secondary to Ozempic  which she had been uptitrating, that he has not been taking that since and reports ongoing symptoms which have worsened significantly over the past couple days.   He denies diarrhea, fever, chills, sick contacts, or recent travel.   ED Course: Upon arrival to the ED, patient is found to be afebrile and saturating well on room air with mild tachycardia and elevated BP.  Labs are most notable for glucose 316, creatinine 1.23, AST 51, ALT 50, total bilirubin 1.6, normal lipase, normal WBC, lactate 4.3, and troponin 61.  CT is negative for acute findings in the abdomen or pelvis.   Patient was given 1.5 L of LR and a dose of Reglan .  Significant Events: Admitted 02/16/2024 intractable N/V 11/2 -- afternoon, pt became hypotensive.  He had been given Entresto , Coreg , hydralazine  and Imdur  in the AM.  BP improved with additional IV fluids and 10 mg midodrine.  11/3 -- BP's still soft, pt feeling weak and lightheaded when up OOB.  Nausea improved. Advance diet to fulls.    Admission Labs: WBC 7.4, HgB 15.3, plt 200 Na  132, K 3.8, CO2 of 23, Bun 20, Scr 1.23, glu 316 Lipase 49 Mg 2.0 VBG pH 7.47, PCO2 of 35 Lactic acid 3.5  Admission Imaging Studies: CT abd/pelvis Hepatic steatosis. 2. No acute or active process within the abdomen or pelvis. 3. Aortic atherosclerosis  Significant Labs:   Significant Imaging Studies: Echo today shows 60 to 65%   Antibiotic Therapy: Anti-infectives (From admission, onward)    None       Procedures:   Consultants:     Assessment and Plan: * Intractable nausea and vomiting 02/20/24 unclear the exact cause of his intractable nausea vomiting.  He is having problems with accommodation due to the scopolamine patch along with difficulty walking, orthostasis and dry mouth.  Will go ahead and stop his scopolamine patch.  Continue with IV fluids for now.   Bacteremia due to Klebsiella pneumoniae 02/20/24  Unclear where his Klebsiella pneumonia a bacteremia is from.  Started on IV Rocephin.  His CT abdomen pelvis was negative for any acute pathology.  He is on room air.  I do not think he has pneumonia.  Urinalysis was not impressive for acute cystitis.   Prolonged QT interval 02/20/24 repeat EKG.  Potassium is 3.9 and his magnesium  is 1.8.   CKD stage 3a, GFR 45-59 ml/min (HCC) 02/20/24 Creatinine 1.36 today.  Stable.   Class II obesity Body mass index is 35.21 kg/m.   Heart failure with improved ejection fraction (HFimpEF) (HCC) 02/20/24 appears euvolemic.  Continue on Coreg , Imdur .  Holding on hydralazine   and Lasix  due to orthostasis today. Echo today shows 60 to 65%    DM2 (diabetes mellitus, type 2) (HCC) 02/20/24 continue with sliding scale insulin .   DVT prophylaxis:   Lovenox    Code Status: Full Code Family Communication: no family at bedside. He is decisional. Disposition Plan: return home Reason for continuing need for hospitalization: on IV Rocephin.  Objective: Vitals:   02/20/24 0416 02/20/24 0458 02/20/24 0910 02/20/24 1445   BP:  116/83 (!) 139/92 99/65  Pulse:  93  99  Resp:  16    Temp:  98.1 F (36.7 C)  98 F (36.7 C)  TempSrc:  Oral  Oral  SpO2:  100%  100%  Weight: 111.3 kg     Height:        Intake/Output Summary (Last 24 hours) at 02/20/2024 1806 Last data filed at 02/20/2024 1704 Gross per 24 hour  Intake 2643.33 ml  Output 1200 ml  Net 1443.33 ml   Filed Weights   02/18/24 1439 02/19/24 0512 02/20/24 0416  Weight: 110 kg 110.7 kg 111.3 kg    Examination:  Physical Exam Vitals and nursing note reviewed.  Constitutional:      Appearance: He is obese.  HENT:     Head: Normocephalic and atraumatic.  Eyes:     General: No scleral icterus. Cardiovascular:     Rate and Rhythm: Normal rate and regular rhythm.  Pulmonary:     Effort: Pulmonary effort is normal.     Breath sounds: Normal breath sounds.  Abdominal:     General: Bowel sounds are normal.     Palpations: Abdomen is soft.  Skin:    General: Skin is warm and dry.     Capillary Refill: Capillary refill takes less than 2 seconds.  Neurological:     Mental Status: He is alert and oriented to person, place, and time.     Data Reviewed: I have personally reviewed following labs and imaging studies  CBC: Recent Labs  Lab 02/16/24 2158 02/17/24 0556  WBC 7.4 8.5  NEUTROABS 5.3  --   HGB 15.3 14.1  HCT 46.3 42.8  MCV 86.4 86.5  PLT 200 198   Basic Metabolic Panel: Recent Labs  Lab 02/16/24 2158 02/17/24 0556 02/18/24 0401 02/19/24 0435 02/20/24 0428  NA 132* 135 135 139 138  K 3.8 3.2* 3.4* 3.4* 3.9  CL 91* 99 104 108 106  CO2 23 23 22 22  21*  GLUCOSE 316* 208* 153* 108* 165*  BUN 20 15 12 10 10   CREATININE 1.23 0.97 0.95 0.91 1.36*  CALCIUM  9.3 8.4* 7.8* 8.1* 8.3*  MG 2.0  --  1.7 1.8  --    GFR: Estimated Creatinine Clearance: 81.2 mL/min (A) (by C-G formula based on SCr of 1.36 mg/dL (H)). Liver Function Tests: Recent Labs  Lab 02/16/24 2158 02/17/24 0556  AST 51* 46*  ALT 50* 43  ALKPHOS 57  49  BILITOT 1.6* 1.2  PROT 7.2 6.0*  ALBUMIN  4.0 3.5   Recent Labs  Lab 02/16/24 2158  LIPASE 49   BNP (last 3 results) Recent Labs    10/01/23 1253  BNP 27.1   CBG: Recent Labs  Lab 02/20/24 0008 02/20/24 0427 02/20/24 0720 02/20/24 1153 02/20/24 1703  GLUCAP 161* 156* 135* 135* 206*   Sepsis Labs: Recent Labs  Lab 02/16/24 2205 02/17/24 0022 02/17/24 0557 02/19/24 0435  LATICACIDVEN 3.5* 4.3* 2.3* 1.8    Recent Results (from the past 240 hours)  Culture, blood (  single) w Reflex to ID Panel     Status: None (Preliminary result)   Collection Time: 02/19/24  5:40 PM   Specimen: BLOOD RIGHT ARM  Result Value Ref Range Status   Specimen Description   Final    BLOOD RIGHT ARM Performed at [facility], [address] [place], [place] [address]    Special Requests   Final    BOTTLES DRAWN AEROBIC AND ANAEROBIC Blood Culture results may not be optimal due to an inadequate volume of blood received in culture bottles Performed at [facility], [address] [place], [place] [address]    Culture  Setup Time   Final    GRAM NEGATIVE RODS AEROBIC BOTTLE ONLY CRITICAL RESULT CALLED TO, READ BACK BY AND VERIFIED WITH: PHARMD A.UTOMWEN AT 1223 ON 02/20/2024 BY T.SAAD. Performed at [facility], [address] [place], [place] [address]    Culture GRAM NEGATIVE RODS  Final   Report Status PENDING  Incomplete  Blood Culture ID Panel (Reflexed)     Status: Abnormal   Collection Time: 02/19/24  5:40 PM  Result Value Ref Range Status   Enterococcus faecalis NOT DETECTED NOT DETECTED Final   Enterococcus Faecium NOT DETECTED NOT DETECTED Final   Listeria monocytogenes NOT DETECTED NOT DETECTED Final   Staphylococcus species NOT DETECTED NOT DETECTED Final   Staphylococcus aureus (BCID) NOT DETECTED NOT DETECTED Final   Staphylococcus epidermidis NOT DETECTED NOT DETECTED Final   Staphylococcus lugdunensis NOT DETECTED NOT DETECTED Final    Streptococcus species NOT DETECTED NOT DETECTED Final   Streptococcus agalactiae NOT DETECTED NOT DETECTED Final   Streptococcus pneumoniae NOT DETECTED NOT DETECTED Final   Streptococcus pyogenes NOT DETECTED NOT DETECTED Final   A.calcoaceticus-baumannii NOT DETECTED NOT DETECTED Final   Bacteroides fragilis NOT DETECTED NOT DETECTED Final   Enterobacterales DETECTED (A) NOT DETECTED Final    Comment: Enterobacterales represent a large order of gram negative bacteria, not a single organism. CRITICAL RESULT CALLED TO, READ BACK BY AND VERIFIED WITH: PHARMD A.UTOMWEN AT 1223 ON 02/20/2024 BY T.SAAD.    Enterobacter cloacae complex NOT DETECTED NOT DETECTED Final   Escherichia coli NOT DETECTED NOT DETECTED Final   Klebsiella aerogenes NOT DETECTED NOT DETECTED Final   Klebsiella oxytoca NOT DETECTED NOT DETECTED Final   Klebsiella pneumoniae DETECTED (A) NOT DETECTED Final    Comment: CRITICAL RESULT CALLED TO, READ BACK BY AND VERIFIED WITH: PHARMD A.UTOMWEN AT 1223 ON 02/20/2024 BY T.SAAD.    Proteus species NOT DETECTED NOT DETECTED Final   Salmonella species NOT DETECTED NOT DETECTED Final   Serratia marcescens NOT DETECTED NOT DETECTED Final   Haemophilus influenzae NOT DETECTED NOT DETECTED Final   Neisseria meningitidis NOT DETECTED NOT DETECTED Final   Pseudomonas aeruginosa NOT DETECTED NOT DETECTED Final   Stenotrophomonas maltophilia NOT DETECTED NOT DETECTED Final   Candida albicans NOT DETECTED NOT DETECTED Final   Candida auris NOT DETECTED NOT DETECTED Final   Candida glabrata NOT DETECTED NOT DETECTED Final   Candida krusei NOT DETECTED NOT DETECTED Final   Candida parapsilosis NOT DETECTED NOT DETECTED Final   Candida tropicalis NOT DETECTED NOT DETECTED Final   Cryptococcus neoformans/gattii NOT DETECTED NOT DETECTED Final   CTX-M ESBL NOT DETECTED NOT DETECTED Final   Carbapenem resistance IMP NOT DETECTED NOT DETECTED Final   Carbapenem resistance KPC NOT  DETECTED NOT DETECTED Final   Carbapenem resistance NDM NOT DETECTED NOT DETECTED Final   Carbapenem resist  OXA 48 LIKE NOT DETECTED NOT DETECTED Final   Carbapenem resistance VIM NOT DETECTED NOT DETECTED Final    Comment: Performed at [facility], [address], [place], [place] [address]     Radiology Studies: ECHOCARDIOGRAM COMPLETE Result Date: 02/20/2024    ECHOCARDIOGRAM REPORT   Patient Name:   JERRALD DOVERSPIKE Date of Exam: 02/20/2024 Medical Rec #:  [id number]  Height:       70.0 in Accession #:    [id number] Weight:       245.4 lb Date of Birth:  [birth year]  BSA:          2.277 m Patient Age:    52 years   BP:           139/92 mmHg Patient Gender: M          HR:           92 bpm. Exam Location:  Inpatient Procedure: 2D Echo, Cardiac Doppler, Color Doppler and Intracardiac            Opacification Agent (Both Spectral and Color Flow Doppler were            utilized during procedure). Indications:    I50.40* Unspecified combined systolic (congestive) and diastolic                 (congestive) heart failure  History:        Patient has prior history of Echocardiogram examinations, most                 recent 12/31/2023. CHF; Risk Factors:Hypertension and Diabetes.  Sonographer:    Ellouise Mose RDCS Referring Phys: [id number] [facility] A GRIFFITH  Sonographer Comments: Technically difficult study due to poor echo windows and patient is obese. Image acquisition challenging due to patient body habitus. Patient could not turn and had trouble holding breath. IMPRESSIONS  1. Left ventricular ejection fraction, by estimation, is 60 to 65%. The left ventricle has normal function. The left ventricle has no regional wall motion abnormalities. There is mild asymmetric left ventricular hypertrophy of the septal segment. Left ventricular diastolic parameters are consistent with Grade I diastolic dysfunction (impaired relaxation). Elevated left ventricular end-diastolic pressure.  2. Right ventricular systolic function is  normal. The right ventricular size is normal. Severely increased right ventricular wall thickness. There is normal pulmonary artery systolic pressure.  3. A small pericardial effusion is present. The pericardial effusion is circumferential. There is no evidence of cardiac tamponade.  4. The mitral valve is normal in structure. Trivial mitral valve regurgitation. No evidence of mitral stenosis.  5. The aortic valve is tricuspid. Aortic valve regurgitation is not visualized. No aortic stenosis is present.  6. The inferior vena cava is normal in size with greater than 50% respiratory variability, suggesting right atrial pressure of 3 mmHg. FINDINGS  Left Ventricle: Left ventricular ejection fraction, by estimation, is 60 to 65%. The left ventricle has normal function. The left ventricle has no regional wall motion abnormalities. Global longitudinal strain performed but not reported based on interpreter judgement due to suboptimal tracking. The left ventricular internal cavity size was small. There is mild asymmetric left ventricular hypertrophy of the septal segment. Left ventricular diastolic parameters are consistent with Grade I diastolic dysfunction (impaired relaxation). Elevated left ventricular end-diastolic pressure. Right Ventricle: The right ventricular size is normal. Severely increased right ventricular wall thickness. Right ventricular systolic function is normal. There is normal pulmonary artery systolic pressure. The tricuspid regurgitant velocity is 1.64 m/s,  and with an assumed right atrial pressure of 3 mmHg, the estimated right ventricular systolic pressure is 13.8 mmHg. Left Atrium: Left atrial size was normal in size. Right Atrium: Right atrial size was normal in size. Pericardium: A small pericardial effusion is present. The pericardial effusion is circumferential. There is diastolic collapse of the right ventricular free wall. There is no evidence of cardiac tamponade. Presence of epicardial fat  layer. Mitral Valve: The mitral valve is normal in structure. Trivial mitral valve regurgitation. No evidence of mitral valve stenosis. Tricuspid Valve: The tricuspid valve is normal in structure. Tricuspid valve regurgitation is trivial. No evidence of tricuspid stenosis. Aortic Valve: The aortic valve is tricuspid. Aortic valve regurgitation is not visualized. No aortic stenosis is present. Pulmonic Valve: The pulmonic valve was normal in structure. Pulmonic valve regurgitation is not visualized. No evidence of pulmonic stenosis. Aorta: The aortic root and ascending aorta are structurally normal, with no evidence of dilitation. Venous: The inferior vena cava is normal in size with greater than 50% respiratory variability, suggesting right atrial pressure of 3 mmHg. IAS/Shunts: No atrial level shunt detected by color flow Doppler.  LEFT VENTRICLE PLAX 2D LVIDd:         5.00 cm     Diastology LVIDs:         3.70 cm     LV e' medial:    3.59 cm/s LV PW:         1.10 cm     LV E/e' medial:  19.1 LV IVS:        1.40 cm     LV e' lateral:   4.24 cm/s LVOT diam:     2.35 cm     LV E/e' lateral: 16.1 LV SV:         56 LV SV Index:   24          2D Longitudinal Strain LVOT Area:     4.34 cm    2D Strain GLS (A4C):   -14.2 %                            2D Strain GLS (A3C):   -4.5 %                            2D Strain GLS (A2C):   -11.1 % LV Volumes (MOD)           2D Strain GLS Avg:     -9.9 % LV vol d, MOD A2C: 52.3 ml LV vol d, MOD A4C: 51.7 ml LV vol s, MOD A2C: 25.9 ml LV vol s, MOD A4C: 28.3 ml LV SV MOD A2C:     26.4 ml LV SV MOD A4C:     51.7 ml LV SV MOD BP:      26.3 ml RIGHT VENTRICLE             IVC RV S prime:     10.40 cm/s  IVC diam: 1.70 cm TAPSE (M-mode): 1.7 cm                             PULMONARY VEINS                             Diastolic Velocity: 31.90 cm/s  S/D Velocity:       1.40                             Systolic Velocity:  44.30 cm/s LEFT ATRIUM             Index         RIGHT ATRIUM          Index LA diam:        3.30 cm 1.45 cm/m   RA Area:     5.02 cm LA Vol (A2C):   18.4 ml 8.08 ml/m   RA Volume:   6.96 ml  3.06 ml/m LA Vol (A4C):   29.6 ml 13.00 ml/m LA Biplane Vol: 23.2 ml 10.19 ml/m  AORTIC VALVE LVOT Vmax:   72.50 cm/s LVOT Vmean:  49.600 cm/s LVOT VTI:    0.128 m  AORTA Ao Root diam: 3.80 cm Ao Asc diam:  3.30 cm MITRAL VALVE               TRICUSPID VALVE MV Area (PHT): 7.16 cm    TR Peak grad:   10.8 mmHg MV Decel Time: 106 msec    TR Vmax:        164.00 cm/s MV E velocity: 68.40 cm/s MV A velocity: 90.80 cm/s  SHUNTS MV E/A ratio:  0.75        Systemic VTI:  0.13 m                            Systemic Diam: 2.35 cm Annabella Scarce MD Electronically signed by Annabella Scarce MD Signature Date/Time: 02/20/2024/1:56:43 PM    Final     Scheduled Meds:  aspirin  EC  81 mg Oral Daily   atorvastatin   40 mg Oral Daily   carvedilol   3.125 mg Oral BID WC   enoxaparin  (LOVENOX ) injection  50 mg Subcutaneous Q24H   feeding supplement  1 Container Oral TID BM   [START ON 02/21/2024] insulin  aspart  0-15 Units Subcutaneous TID WC   insulin  aspart  0-5 Units Subcutaneous QHS   isosorbide  mononitrate  60 mg Oral Daily   pregabalin   75 mg Oral BID   sodium chloride  flush  3 mL Intravenous Q12H   Continuous Infusions:  cefTRIAXone (ROCEPHIN)  IV 2 g (02/20/24 1441)   magnesium  sulfate bolus IVPB Stopped (02/18/24 1228)     LOS: 3 days   Time spent: 60 minutes  Camellia Door, DO  [facility]  02/20/2024, 6:06 PM

## 2024-02-21 DIAGNOSIS — N1831 Chronic kidney disease, stage 3a: Secondary | ICD-10-CM | POA: Diagnosis not present

## 2024-02-21 DIAGNOSIS — R112 Nausea with vomiting, unspecified: Secondary | ICD-10-CM | POA: Diagnosis not present

## 2024-02-21 DIAGNOSIS — R7881 Bacteremia: Secondary | ICD-10-CM | POA: Diagnosis not present

## 2024-02-21 DIAGNOSIS — E66812 Obesity, class 2: Secondary | ICD-10-CM | POA: Diagnosis not present

## 2024-02-21 LAB — CBC WITH DIFFERENTIAL/PLATELET
Abs Immature Granulocytes: 0.05 K/uL (ref 0.00–0.07)
Basophils Absolute: 0 K/uL (ref 0.0–0.1)
Basophils Relative: 1 %
Eosinophils Absolute: 0.3 K/uL (ref 0.0–0.5)
Eosinophils Relative: 5 %
HCT: 34.3 % — ABNORMAL LOW (ref 39.0–52.0)
Hemoglobin: 11 g/dL — ABNORMAL LOW (ref 13.0–17.0)
Immature Granulocytes: 1 %
Lymphocytes Relative: 33 %
Lymphs Abs: 2.2 K/uL (ref 0.7–4.0)
MCH: 28.6 pg (ref 26.0–34.0)
MCHC: 32.1 g/dL (ref 30.0–36.0)
MCV: 89.3 fL (ref 80.0–100.0)
Monocytes Absolute: 0.8 K/uL (ref 0.1–1.0)
Monocytes Relative: 12 %
Neutro Abs: 3.3 K/uL (ref 1.7–7.7)
Neutrophils Relative %: 48 %
Platelets: 125 K/uL — ABNORMAL LOW (ref 150–400)
RBC: 3.84 MIL/uL — ABNORMAL LOW (ref 4.22–5.81)
RDW: 14.6 % (ref 11.5–15.5)
WBC: 6.6 K/uL (ref 4.0–10.5)
nRBC: 0 % (ref 0.0–0.2)

## 2024-02-21 LAB — COMPREHENSIVE METABOLIC PANEL WITH GFR
ALT: 45 U/L — ABNORMAL HIGH (ref 0–44)
AST: 32 U/L (ref 15–41)
Albumin: 3.2 g/dL — ABNORMAL LOW (ref 3.5–5.0)
Alkaline Phosphatase: 52 U/L (ref 38–126)
Anion gap: 10 (ref 5–15)
BUN: 12 mg/dL (ref 6–20)
CO2: 21 mmol/L — ABNORMAL LOW (ref 22–32)
Calcium: 8.2 mg/dL — ABNORMAL LOW (ref 8.9–10.3)
Chloride: 107 mmol/L (ref 98–111)
Creatinine, Ser: 1.08 mg/dL (ref 0.61–1.24)
GFR, Estimated: >60 mL/min (ref >60)
Glucose, Bld: 145 mg/dL — ABNORMAL HIGH (ref 70–99)
Potassium: 3.6 mmol/L (ref 3.5–5.1)
Sodium: 138 mmol/L (ref 135–145)
Total Bilirubin: 0.5 mg/dL (ref 0.0–1.2)
Total Protein: 5.7 g/dL — ABNORMAL LOW (ref 6.5–8.1)

## 2024-02-21 LAB — GLUCOSE, CAPILLARY
Glucose-Capillary: 143 mg/dL — ABNORMAL HIGH (ref 70–99)
Glucose-Capillary: 195 mg/dL — ABNORMAL HIGH (ref 70–99)
Glucose-Capillary: 139 mg/dL — ABNORMAL HIGH (ref 70–99)
Glucose-Capillary: 233 mg/dL — ABNORMAL HIGH (ref 70–99)

## 2024-02-21 LAB — MAGNESIUM: Magnesium: 1.7 mg/dL (ref 1.7–2.4)

## 2024-02-21 NOTE — Progress Notes (Signed)
 Mobility Specialist - Progress Note   02/21/24 1150  Mobility  Activity Ambulated with assistance  Level of Assistance Minimal assist, patient does 75% or more  Assistive Device Front wheel walker  Distance Ambulated (ft) 30 ft  Range of Motion/Exercises Active  Activity Response Tolerated well  Mobility visit 1 Mobility  Mobility Specialist Start Time (ACUTE ONLY) 1135  Mobility Specialist Stop Time (ACUTE ONLY) 1150  Mobility Specialist Time Calculation (min) (ACUTE ONLY) 15 min   Pt was found in bed and agreeable to mobilize. NT assisted with chair follow. Grew fatigued with session. At EOS returned to recliner chair with all needs met. Call bell in reach and family in room. RN notified.   Erminio Leos,  Mobility Specialist Can be reached via Secure Chat

## 2024-02-21 NOTE — Progress Notes (Signed)
 PROGRESS NOTE    Terry Young  FMW:[id number] DOB: 21-Dec-1970 DOA: 02/16/2024 PCP: [name], MD  Subjective: Pt seen and examined. His vision changes are resolved now that scopolamine patch was removed. Pt able to eat with N/v. On IV rocephin awaiting final MIC. Walked better today. No diarrhea.   Hospital Course: CC: N/V HPI: Terry Young is a 53 y.o. male with medical history significant for hypertension, diabetes mellitus, CKD 3A, and chronic CHF with improved EF who presents with abdominal pain, nausea, and vomiting.   Patient was admitted with similar symptoms in September, presentation was suspected secondary to Ozempic  which she had been uptitrating, that he has not been taking that since and reports ongoing symptoms which have worsened significantly over the past couple days.   He denies diarrhea, fever, chills, sick contacts, or recent travel.   ED Course: Upon arrival to the ED, patient is found to be afebrile and saturating well on room air with mild tachycardia and elevated BP.  Labs are most notable for glucose 316, creatinine 1.23, AST 51, ALT 50, total bilirubin 1.6, normal lipase, normal WBC, lactate 4.3, and troponin 61.  CT is negative for acute findings in the abdomen or pelvis.   Patient was given 1.5 L of LR and a dose of Reglan .  Significant Events: Admitted 02/16/2024 intractable N/V 11/2 -- afternoon, pt became hypotensive.  He had been given Entresto , Coreg , hydralazine  and Imdur  in the AM.  BP improved with additional IV fluids and 10 mg midodrine.  11/3 -- BP's still soft, pt feeling weak and lightheaded when up OOB.  Nausea improved. Advance diet to fulls.    Admission Labs: WBC 7.4, HgB 15.3, plt 200 Na 132, K 3.8, CO2 of 23, Bun 20, Scr 1.23, glu 316 Lipase 49 Mg 2.0 VBG pH 7.47, PCO2 of 35 Lactic acid 3.5  Admission Imaging Studies: CT abd/pelvis Hepatic steatosis. 2. No acute or active process within the abdomen or pelvis. 3. Aortic  atherosclerosis  Significant Labs:   Significant Imaging Studies: Echo today shows 60 to 65%   Antibiotic Therapy: Anti-infectives (From admission, onward)    None       Procedures:   Consultants:     Assessment and Plan: * Intractable nausea and vomiting-resolved as of 02/21/2024 02/20/24 unclear the exact cause of his intractable nausea vomiting.  He is having problems with accommodation due to the scopolamine patch along with difficulty walking, orthostasis and dry mouth.  Will go ahead and stop his scopolamine patch.  Continue with IV fluids for now.  02/21/24 resolved. Stop IVF.    Bacteremia due to Klebsiella pneumoniae 02/20/24  Unclear where his Klebsiella pneumonia a bacteremia is from.  Started on IV Rocephin.  His CT abdomen pelvis was negative for any acute pathology.  He is on room air.  I do not think he has pneumonia.  Urinalysis was not impressive for acute cystitis.  02/21/24 pt feeling better. Unclear the source of his gram negative septicemia. Awaiting final MIC. Pt states he has been sick since September 2025. I doubt he has been septicemic for 2 months.    Prolonged QT interval 02/20/24 repeat EKG.  Potassium is 3.9 and his magnesium  is 1.8.  02/21/24 QTC still 557 msec. But improved   CKD stage 3a, GFR 45-59 ml/min (HCC) 02/20/24 Creatinine 1.36 today.  Stable.  02/21/24 Scr down to 1.08 today. Stop IVF.    Class II obesity Body mass index is 35.21 kg/m.   Heart failure with  improved ejection fraction (HFimpEF) (HCC) 02/20/24 appears euvolemic.  Continue on Coreg , Imdur .  Holding on hydralazine  and Lasix  due to orthostasis today. Echo today shows 60 to 65%   02/21/24 stable. Is no longer orthostatic. Continue with just coreg  and imdur . Still holding lasix  and hydralazine .    DM2 (diabetes mellitus, type 2) (HCC) 02/20/24 continue with sliding scale insulin .  02/21/24 stable. On SSI.    DVT prophylaxis:   Lovenox    Code  Status: Full Code Family Communication: no family at bedside Disposition Plan: return home Reason for continuing need for hospitalization: remains on IV Abx.  Objective: Vitals:   02/21/24 0500 02/21/24 0626 02/21/24 0848 02/21/24 1343  BP:  (!) 146/97 (!) 141/93 (!) 120/92  Pulse:  91  (!) 102  Resp:  16  18  Temp:  98 F (36.7 C)  98.8 F (37.1 C)  TempSrc:  Oral  Oral  SpO2:  100%  100%  Weight: 115.5 kg     Height:        Intake/Output Summary (Last 24 hours) at 02/21/2024 1453 Last data filed at 02/21/2024 1345 Gross per 24 hour  Intake 1820 ml  Output 1300 ml  Net 520 ml   Filed Weights   02/19/24 0512 02/20/24 0416 02/21/24 0500  Weight: 110.7 kg 111.3 kg 115.5 kg    Examination:  Physical Exam Vitals and nursing note reviewed.  Constitutional:      Appearance: He is obese.  HENT:     Head: Normocephalic and atraumatic.  Cardiovascular:     Rate and Rhythm: Normal rate and regular rhythm.  Pulmonary:     Effort: Pulmonary effort is normal.     Breath sounds: Normal breath sounds.  Abdominal:     General: Abdomen is protuberant. Bowel sounds are normal. There is no distension.     Palpations: Abdomen is soft.  Skin:    General: Skin is warm and dry.     Capillary Refill: Capillary refill takes less than 2 seconds.  Neurological:     General: No focal deficit present.     Mental Status: He is alert and oriented to person, place, and time.     Data Reviewed: I have personally reviewed following labs and imaging studies  CBC: Recent Labs  Lab 02/16/24 2158 02/17/24 0556 02/21/24 0438  WBC 7.4 8.5 6.6  NEUTROABS 5.3  --  3.3  HGB 15.3 14.1 11.0*  HCT 46.3 42.8 34.3*  MCV 86.4 86.5 89.3  PLT 200 198 125*   Basic Metabolic Panel: Recent Labs  Lab 02/16/24 2158 02/17/24 0556 02/18/24 0401 02/19/24 0435 02/20/24 0428 02/21/24 0438  NA 132* 135 135 139 138 138  K 3.8 3.2* 3.4* 3.4* 3.9 3.6  CL 91* 99 104 108 106 107  CO2 23 23 22 22  21*  21*  GLUCOSE 316* 208* 153* 108* 165* 145*  BUN 20 15 12 10 10 12   CREATININE 1.23 0.97 0.95 0.91 1.36* 1.08  CALCIUM  9.3 8.4* 7.8* 8.1* 8.3* 8.2*  MG 2.0  --  1.7 1.8  --  1.7   GFR: Estimated Creatinine Clearance: 103 mL/min (by C-G formula based on SCr of 1.08 mg/dL). Liver Function Tests: Recent Labs  Lab 02/16/24 2158 02/17/24 0556 02/21/24 0438  AST 51* 46* 32  ALT 50* 43 45*  ALKPHOS 57 49 52  BILITOT 1.6* 1.2 0.5  PROT 7.2 6.0* 5.7*  ALBUMIN  4.0 3.5 3.2*   Recent Labs  Lab 02/16/24 2158  LIPASE 49  BNP (last 3 results) Recent Labs    10/01/23 1253  BNP 27.1   CBG: Recent Labs  Lab 02/20/24 1153 02/20/24 1703 02/20/24 2112 02/21/24 0715 02/21/24 1106  GLUCAP 135* 206* 171* 143* 195*   Sepsis Labs: Recent Labs  Lab 02/16/24 2205 02/17/24 0022 02/17/24 0557 02/19/24 0435  LATICACIDVEN 3.5* 4.3* 2.3* 1.8    Recent Results (from the past 240 hours)  Culture, blood (single) w Reflex to ID Panel     Status: Abnormal (Preliminary result)   Collection Time: 02/19/24  5:40 PM   Specimen: BLOOD RIGHT ARM  Result Value Ref Range Status   Specimen Description   Final    BLOOD RIGHT ARM Performed at [facility], [address] [place], [place] [address]    Special Requests   Final    BOTTLES DRAWN AEROBIC AND ANAEROBIC Blood Culture results may not be optimal due to an inadequate volume of blood received in culture bottles Performed at [facility], [address], [place], [place] [address]    Culture  Setup Time   Final    GRAM NEGATIVE RODS AEROBIC BOTTLE ONLY CRITICAL RESULT CALLED TO, READ BACK BY AND VERIFIED WITH: PHARMD A.UTOMWEN AT 1223 ON 02/20/2024 BY T.SAAD.    Culture (A)  Final    KLEBSIELLA PNEUMONIAE SUSCEPTIBILITIES TO FOLLOW Performed at [facility], [address], [place], [place] [address]    Report Status PENDING  Incomplete  Blood Culture ID Panel (Reflexed)     Status: Abnormal    Collection Time: 02/19/24  5:40 PM  Result Value Ref Range Status   Enterococcus faecalis NOT DETECTED NOT DETECTED Final   Enterococcus Faecium NOT DETECTED NOT DETECTED Final   Listeria monocytogenes NOT DETECTED NOT DETECTED Final   Staphylococcus species NOT DETECTED NOT DETECTED Final   Staphylococcus aureus (BCID) NOT DETECTED NOT DETECTED Final   Staphylococcus epidermidis NOT DETECTED NOT DETECTED Final   Staphylococcus lugdunensis NOT DETECTED NOT DETECTED Final   Streptococcus species NOT DETECTED NOT DETECTED Final   Streptococcus agalactiae NOT DETECTED NOT DETECTED Final   Streptococcus pneumoniae NOT DETECTED NOT DETECTED Final   Streptococcus pyogenes NOT DETECTED NOT DETECTED Final   A.calcoaceticus-baumannii NOT DETECTED NOT DETECTED Final   Bacteroides fragilis NOT DETECTED NOT DETECTED Final   Enterobacterales DETECTED (A) NOT DETECTED Final    Comment: Enterobacterales represent a large order of gram negative bacteria, not a single organism. CRITICAL RESULT CALLED TO, READ BACK BY AND VERIFIED WITH: PHARMD A.UTOMWEN AT 1223 ON 02/20/2024 BY T.SAAD.    Enterobacter cloacae complex NOT DETECTED NOT DETECTED Final   Escherichia coli NOT DETECTED NOT DETECTED Final   Klebsiella aerogenes NOT DETECTED NOT DETECTED Final   Klebsiella oxytoca NOT DETECTED NOT DETECTED Final   Klebsiella pneumoniae DETECTED (A) NOT DETECTED Final    Comment: CRITICAL RESULT CALLED TO, READ BACK BY AND VERIFIED WITH: PHARMD A.UTOMWEN AT 1223 ON 02/20/2024 BY T.SAAD.    Proteus species NOT DETECTED NOT DETECTED Final   Salmonella species NOT DETECTED NOT DETECTED Final   Serratia marcescens NOT DETECTED NOT DETECTED Final   Haemophilus influenzae NOT DETECTED NOT DETECTED Final   Neisseria meningitidis NOT DETECTED NOT DETECTED Final   Pseudomonas aeruginosa NOT DETECTED NOT DETECTED Final   Stenotrophomonas maltophilia NOT DETECTED NOT DETECTED Final   Candida albicans NOT DETECTED NOT  DETECTED Final   Candida auris NOT DETECTED NOT DETECTED Final   Candida glabrata NOT DETECTED NOT DETECTED  Final   Candida krusei NOT DETECTED NOT DETECTED Final   Candida parapsilosis NOT DETECTED NOT DETECTED Final   Candida tropicalis NOT DETECTED NOT DETECTED Final   Cryptococcus neoformans/gattii NOT DETECTED NOT DETECTED Final   CTX-M ESBL NOT DETECTED NOT DETECTED Final   Carbapenem resistance IMP NOT DETECTED NOT DETECTED Final   Carbapenem resistance KPC NOT DETECTED NOT DETECTED Final   Carbapenem resistance NDM NOT DETECTED NOT DETECTED Final   Carbapenem resist OXA 48 LIKE NOT DETECTED NOT DETECTED Final   Carbapenem resistance VIM NOT DETECTED NOT DETECTED Final    Comment: Performed at [facility], [address] [place], [place] [address]     Radiology Studies: ECHOCARDIOGRAM COMPLETE Result Date: 02/20/2024    ECHOCARDIOGRAM REPORT   Patient Name:   Terry Young Date of Exam: 02/20/2024 Medical Rec #:  [id number]  Height:       70.0 in Accession #:    [id number] Weight:       245.4 lb Date of Birth:  04-20-1970  BSA:          2.277 m Patient Age:    52 years   BP:           139/92 mmHg Patient Gender: M          HR:           92 bpm. Exam Location:  Inpatient Procedure: 2D Echo, Cardiac Doppler, Color Doppler and Intracardiac            Opacification Agent (Both Spectral and Color Flow Doppler were            utilized during procedure). Indications:    I50.40* Unspecified combined systolic (congestive) and diastolic                 (congestive) heart failure  History:        Patient has prior history of Echocardiogram examinations, most                 recent 12/31/2023. CHF; Risk Factors:Hypertension and Diabetes.  Sonographer:    Ellouise Mose RDCS Referring Phys: [id number] [facility] A GRIFFITH  Sonographer Comments: Technically difficult study due to poor echo windows and patient is obese. Image acquisition challenging due to patient body habitus. Patient could not turn and had trouble  holding breath. IMPRESSIONS  1. Left ventricular ejection fraction, by estimation, is 60 to 65%. The left ventricle has normal function. The left ventricle has no regional wall motion abnormalities. There is mild asymmetric left ventricular hypertrophy of the septal segment. Left ventricular diastolic parameters are consistent with Grade I diastolic dysfunction (impaired relaxation). Elevated left ventricular end-diastolic pressure.  2. Right ventricular systolic function is normal. The right ventricular size is normal. Severely increased right ventricular wall thickness. There is normal pulmonary artery systolic pressure.  3. A small pericardial effusion is present. The pericardial effusion is circumferential. There is no evidence of cardiac tamponade.  4. The mitral valve is normal in structure. Trivial mitral valve regurgitation. No evidence of mitral stenosis.  5. The aortic valve is tricuspid. Aortic valve regurgitation is not visualized. No aortic stenosis is present.  6. The inferior vena cava is normal in size with greater than 50% respiratory variability, suggesting right atrial pressure of 3 mmHg. FINDINGS  Left Ventricle: Left ventricular ejection fraction, by estimation, is 60 to 65%. The left ventricle has normal function. The left ventricle has no regional wall motion abnormalities. Global longitudinal strain performed but not  reported based on interpreter judgement due to suboptimal tracking. The left ventricular internal cavity size was small. There is mild asymmetric left ventricular hypertrophy of the septal segment. Left ventricular diastolic parameters are consistent with Grade I diastolic dysfunction (impaired relaxation). Elevated left ventricular end-diastolic pressure. Right Ventricle: The right ventricular size is normal. Severely increased right ventricular wall thickness. Right ventricular systolic function is normal. There is normal pulmonary artery systolic pressure. The tricuspid  regurgitant velocity is 1.64 m/s,  and with an assumed right atrial pressure of 3 mmHg, the estimated right ventricular systolic pressure is 13.8 mmHg. Left Atrium: Left atrial size was normal in size. Right Atrium: Right atrial size was normal in size. Pericardium: A small pericardial effusion is present. The pericardial effusion is circumferential. There is diastolic collapse of the right ventricular free wall. There is no evidence of cardiac tamponade. Presence of epicardial fat layer. Mitral Valve: The mitral valve is normal in structure. Trivial mitral valve regurgitation. No evidence of mitral valve stenosis. Tricuspid Valve: The tricuspid valve is normal in structure. Tricuspid valve regurgitation is trivial. No evidence of tricuspid stenosis. Aortic Valve: The aortic valve is tricuspid. Aortic valve regurgitation is not visualized. No aortic stenosis is present. Pulmonic Valve: The pulmonic valve was normal in structure. Pulmonic valve regurgitation is not visualized. No evidence of pulmonic stenosis. Aorta: The aortic root and ascending aorta are structurally normal, with no evidence of dilitation. Venous: The inferior vena cava is normal in size with greater than 50% respiratory variability, suggesting right atrial pressure of 3 mmHg. IAS/Shunts: No atrial level shunt detected by color flow Doppler.  LEFT VENTRICLE PLAX 2D LVIDd:         5.00 cm     Diastology LVIDs:         3.70 cm     LV e' medial:    3.59 cm/s LV PW:         1.10 cm     LV E/e' medial:  19.1 LV IVS:        1.40 cm     LV e' lateral:   4.24 cm/s LVOT diam:     2.35 cm     LV E/e' lateral: 16.1 LV SV:         56 LV SV Index:   24          2D Longitudinal Strain LVOT Area:     4.34 cm    2D Strain GLS (A4C):   -14.2 %                            2D Strain GLS (A3C):   -4.5 %                            2D Strain GLS (A2C):   -11.1 % LV Volumes (MOD)           2D Strain GLS Avg:     -9.9 % LV vol d, MOD A2C: 52.3 ml LV vol d, MOD A4C: 51.7  ml LV vol s, MOD A2C: 25.9 ml LV vol s, MOD A4C: 28.3 ml LV SV MOD A2C:     26.4 ml LV SV MOD A4C:     51.7 ml LV SV MOD BP:      26.3 ml RIGHT VENTRICLE             IVC RV S prime:  10.40 cm/s  IVC diam: 1.70 cm TAPSE (M-mode): 1.7 cm                             PULMONARY VEINS                             Diastolic Velocity: 31.90 cm/s                             S/D Velocity:       1.40                             Systolic Velocity:  44.30 cm/s LEFT ATRIUM             Index        RIGHT ATRIUM          Index LA diam:        3.30 cm 1.45 cm/m   RA Area:     5.02 cm LA Vol (A2C):   18.4 ml 8.08 ml/m   RA Volume:   6.96 ml  3.06 ml/m LA Vol (A4C):   29.6 ml 13.00 ml/m LA Biplane Vol: 23.2 ml 10.19 ml/m  AORTIC VALVE LVOT Vmax:   72.50 cm/s LVOT Vmean:  49.600 cm/s LVOT VTI:    0.128 m  AORTA Ao Root diam: 3.80 cm Ao Asc diam:  3.30 cm MITRAL VALVE               TRICUSPID VALVE MV Area (PHT): 7.16 cm    TR Peak grad:   10.8 mmHg MV Decel Time: 106 msec    TR Vmax:        164.00 cm/s MV E velocity: 68.40 cm/s MV A velocity: 90.80 cm/s  SHUNTS MV E/A ratio:  0.75        Systemic VTI:  0.13 m                            Systemic Diam: 2.35 cm Annabella Scarce MD Electronically signed by Annabella Scarce MD Signature Date/Time: 02/20/2024/1:56:43 PM    Final     Scheduled Meds:  aspirin  EC  81 mg Oral Daily   atorvastatin   40 mg Oral Daily   carvedilol   3.125 mg Oral BID WC   enoxaparin  (LOVENOX ) injection  50 mg Subcutaneous Q24H   feeding supplement  1 Container Oral TID BM   insulin  aspart  0-15 Units Subcutaneous TID WC   insulin  aspart  0-5 Units Subcutaneous QHS   isosorbide  mononitrate  60 mg Oral Daily   pregabalin   75 mg Oral BID   sodium chloride  flush  3 mL Intravenous Q12H   Continuous Infusions:  cefTRIAXone (ROCEPHIN)  IV 2 g (02/21/24 1440)   magnesium  sulfate bolus IVPB Stopped (02/18/24 1228)     LOS: 4 days   Time spent: 55 minutes  Camellia Door, DO  [facility]  02/21/2024, 2:53 PM

## 2024-02-22 ENCOUNTER — Other Ambulatory Visit (HOSPITAL_COMMUNITY): Payer: Self-pay

## 2024-02-22 DIAGNOSIS — N1831 Chronic kidney disease, stage 3a: Secondary | ICD-10-CM | POA: Diagnosis not present

## 2024-02-22 DIAGNOSIS — R112 Nausea with vomiting, unspecified: Secondary | ICD-10-CM | POA: Diagnosis not present

## 2024-02-22 DIAGNOSIS — E66812 Obesity, class 2: Secondary | ICD-10-CM | POA: Diagnosis not present

## 2024-02-22 DIAGNOSIS — R7881 Bacteremia: Secondary | ICD-10-CM | POA: Diagnosis not present

## 2024-02-22 LAB — CULTURE, BLOOD (SINGLE)

## 2024-02-22 LAB — GLUCOSE, CAPILLARY
Glucose-Capillary: 147 mg/dL — ABNORMAL HIGH (ref 70–99)
Glucose-Capillary: 194 mg/dL — ABNORMAL HIGH (ref 70–99)

## 2024-02-22 MED ORDER — CEFADROXIL 500 MG PO CAPS
1000.0000 mg | ORAL_CAPSULE | Freq: Two times a day (BID) | ORAL | 0 refills | Status: AC
  Filled 2024-02-22: qty 28, 7d supply, fill #0

## 2024-02-22 NOTE — Progress Notes (Signed)
 Discharge medication delivered to patient at he bedside in a secure bag

## 2024-02-22 NOTE — Discharge Summary (Signed)
 [facility] Phys[facility] Discharge Summary   Patient name: Terry Young  Admit date:     02/16/2024  Discharge date: 02/22/2024  Attending Physician: FAUSTO BURNARD LABOR [[id number]]  Discharge Physician: Camellia Door   PCP: [name], MD  Admitted From: Home  Disposition:  Home  Recommendations for Outpatient Follow-up:  Follow up with PCP in 1 week Follow up with cardiology in 1 week Restart hydralazine , lasix  and entresto  when appropriate at f/u appointment.  Home Health:Yes. Home PT Equipment/Devices: rolling walker    Discharge Condition:Stable CODE STATUS:FULL Diet recommendation: Heart Healthy/Diabetic Fluid Restriction: None  Hospital Summary: CC: N/V HPI: Terry Young is a 53 y.o. male with medical history significant for hypertension, diabetes mellitus, CKD 3A, and chronic CHF with improved EF who presents with abdominal pain, nausea, and vomiting.   Patient was admitted with similar symptoms in September, presentation was suspected secondary to Ozempic  which she had been uptitrating, that he has not been taking that since and reports ongoing symptoms which have worsened significantly over the past couple days.   He denies diarrhea, fever, chills, sick contacts, or recent travel.   ED Course: Upon arrival to the ED, patient is found to be afebrile and saturating well on room air with mild tachycardia and elevated BP.  Labs are most notable for glucose 316, creatinine 1.23, AST 51, ALT 50, total bilirubin 1.6, normal lipase, normal WBC, lactate 4.3, and troponin 61.  CT is negative for acute findings in the abdomen or pelvis.   Patient was given 1.5 L of LR and a dose of Reglan .  Significant Events: Admitted 02/16/2024 intractable N/V 11/2 -- afternoon, pt became hypotensive.  He had been given Entresto , Coreg , hydralazine  and Imdur  in the AM.  BP improved with additional IV fluids and 10 mg midodrine.  11/3 -- BP's still soft, pt feeling weak and lightheaded when  up OOB.  Nausea improved. Advance diet to fulls.    Admission Labs: WBC 7.4, HgB 15.3, plt 200 Na 132, K 3.8, CO2 of 23, Bun 20, Scr 1.23, glu 316 Lipase 49 Mg 2.0 VBG pH 7.47, PCO2 of 35 Lactic acid 3.5  Admission Imaging Studies: CT abd/pelvis Hepatic steatosis. 2. No acute or active process within the abdomen or pelvis. 3. Aortic atherosclerosis  Significant Labs:   Significant Imaging Studies: Echo today shows 60 to 65%   Antibiotic Therapy: Anti-infectives (From admission, onward)    None       Procedures:   Consultants:    Hospital Course by Problem: * Intractable nausea and vomiting-resolved as of 02/21/2024 02/20/24 unclear the exact cause of his intractable nausea vomiting.  He is having problems with accommodation due to the scopolamine patch along with difficulty walking, orthostasis and dry mouth.  Will go ahead and stop his scopolamine patch.  Continue with IV fluids for now.  02/21/24 resolved. Stop IVF.    Bacteremia due to Klebsiella pneumoniae 02/20/24  Unclear where his Klebsiella pneumonia a bacteremia is from.  Started on IV Rocephin.  His CT abdomen pelvis was negative for any acute pathology.  He is on room air.  I do not think he has pneumonia.  Urinalysis was not impressive for acute cystitis.  02/21/24 pt feeling better. Unclear the source of his gram negative septicemia. Awaiting final MIC. Pt states he has been sick since September 2025. I doubt he has been septicemic for 2 months.   02/22/24 His Klebsiella pneumoniae septicemia MIC's are back. Sensitive to Ancef and Rocephin. He can safely  transition to p.o. Duricef.  Source is still unknown.  CT abdomen pelvis was negative for any intra-abdominal pathology.  His chest x-ray was negative for pneumonia.  Will treat him with a total 10-day course of antibiotics including his IV Rocephin he received in the hospital.  Susceptibility data from last 90 days. Collected Specimen Info Organism  AMPICILLIN AMPICILLIN/SULBACTAM CEFAZOLIN (NON-URINE) CEFEPIME CEFTRIAXONE Ciprofloxacin Ertapenem Gentamicin Susc lslt Meropenem Piperacillin + Tazobactam Trimethoprim/Sulfa  02/19/24 Blood from BLOOD RIGHT ARM Klebsiella pneumoniae  R  S  S  S  S  S  S  S  S S  S       Prolonged QT interval 02/20/24 repeat EKG.  Potassium is 3.9 and his magnesium  is 1.8.  02/21/24 QTC still 557 msec. But improved   CKD stage 3a, GFR 45-59 ml/min (HCC) 02/20/24 Creatinine 1.36 today.  Stable.  02/21/24 Scr down to 1.08 today. Stop IVF.  02/22/24 stable.  Creatinine 1.08 yesterday.    Class II obesity Body mass index is 35.21 kg/m.   Heart failure with improved ejection fraction (HFimpEF) (HCC) 02/20/24 appears euvolemic.  Continue on Coreg , Imdur .  Holding on hydralazine  and Lasix  due to orthostasis today. Echo today shows 60 to 65%   02/21/24 stable. Is no longer orthostatic. Continue with just coreg  and imdur . Still holding lasix  and hydralazine .  02/22/24 Echo showed normal LVEF of 60 to 65%.  Remains on Imdur  and Coreg .  Will have him hold his Lasix  and his hydralazine  until he is off of antibiotics.  Also holding his Entresto  while he is on antibiotics.  He will need to see his cardiologist or PCP back in the office before he restarts these.   DM2 (diabetes mellitus, type 2) (HCC) 02/20/24 continue with sliding scale insulin .  02/21/24 stable. On SSI.  02/22/24 stable.  He can restart his metformin , Jardiance  at home.     Discharge Diagnoses:  Active Problems:   Bacteremia due to Klebsiella pneumoniae   DM2 (diabetes mellitus, type 2) (HCC)   Heart failure with improved ejection fraction (HFimpEF) (HCC)   Class II obesity   CKD stage 3a, GFR 45-59 ml/min (HCC)   Prolonged QT interval   Discharge Instructions  Discharge Instructions     (HEART FAILURE PATIENTS) Call MD:  Anytime you have any of the following symptoms: 1) 3 pound weight gain in 24 hours or 5 pounds in 1  week 2) shortness of breath, with or without a dry hacking cough 3) swelling in the hands, feet or stomach 4) if you have to sleep on extra pillows at night in order to breathe.   Complete by: As directed    Call MD for:  difficulty breathing, headache or visual disturbances   Complete by: As directed    Call MD for:  extreme fatigue   Complete by: As directed    Call MD for:  hives   Complete by: As directed    Call MD for:  persistant dizziness or light-headedness   Complete by: As directed    Call MD for:  persistant nausea and vomiting   Complete by: As directed    Call MD for:  redness, tenderness, or signs of infection (pain, swelling, redness, odor or green/yellow discharge around incision site)   Complete by: As directed    Call MD for:  severe uncontrolled pain   Complete by: As directed    Call MD for:  temperature >100.4   Complete by: As directed    Diet -  low sodium heart healthy   Complete by: As directed    Diet Carb Modified   Complete by: As directed    Discharge instructions   Complete by: As directed    1. Follow up with your primary care provider in 1-2 weeks following discharge from hospital. 2. Follow up with your cardiologist in 1- weeks following discharge from hospital.   Increase activity slowly   Complete by: As directed       Allergies as of 02/22/2024       Reactions   Lisinopril Cough   Magnesium  Nausea And Vomiting        Medication List     PAUSE taking these medications    furosemide  40 MG tablet Wait to take this until your doctor or other care provider tells you to start again. Hold until told to restart by your PCP or cardiologist Commonly known as: LASIX  Take 1 tablet (40 mg total) by mouth daily.   hydrALAZINE  25 MG tablet Wait to take this until your doctor or other care provider tells you to start again. Hold until told to restart by your PCP or cardiologist Commonly known as: APRESOLINE  Take 1 tablet (25 mg total) by mouth  3 (three) times daily.   Ozempic  (2 MG/DOSE) 8 MG/3ML Sopn Wait to take this until your doctor or other care provider tells you to start again. Hold until you are no longer nauseated or having any diarrhea. Make sure you see your PCP before starting. Generic drug: Semaglutide  (2 MG/DOSE) Inject 2 mg as directed once a week.   sacubitril -valsartan  97-103 MG Wait to take this until your doctor or other care provider tells you to start again. Hold until told to restart by your PCP or cardiologist Commonly known as: ENTRESTO  Take 1 tablet by mouth 2 (two) times daily. Please call  office to schedule an appointment       TAKE these medications    acetaminophen  325 MG tablet Commonly known as: TYLENOL  Take 325 mg by mouth as needed for mild pain (pain score 1-3) or moderate pain (pain score 4-6).   Aspirin  Low Dose 81 MG tablet Generic drug: aspirin  EC Take 1 tablet (81 mg total) by mouth daily.   atorvastatin  40 MG tablet Commonly known as: LIPITOR Take 1 tablet (40 mg total) by mouth daily.   carvedilol  12.5 MG tablet Commonly known as: COREG  Take 1 tablet (12.5 mg total) by mouth 2 (two) times daily with a meal.   cefadroxil 500 MG capsule Commonly known as: DURICEF Take 2 capsules (1,000 mg total) by mouth 2 (two) times daily for 7 days.   isosorbide  mononitrate 60 MG 24 hr tablet Commonly known as: IMDUR  Take 1 tablet (60 mg total) by mouth daily.   Jardiance  10 MG Tabs tablet Generic drug: empagliflozin  Take 1 tablet (10 mg total) by mouth daily before breakfast.   metFORMIN  1000 MG tablet Commonly known as: GLUCOPHAGE  Take 1 tablet (1,000 mg total) by mouth 2 (two) times daily with a meal. Must have office visit for refills   ondansetron  4 MG tablet Commonly known as: Zofran  Take 1 tablet (4 mg total) by mouth every 8 (eight) hours as needed for nausea or vomiting.   Pentips 32G X 4 MM Misc Generic drug: Insulin  Pen Needle Use to inject insulin  as directed.    pregabalin  75 MG capsule Commonly known as: Lyrica  Take 1 capsule (75 mg total) by mouth 2 (two) times daily.  Durable Medical Equipment  (From admission, onward)           Start     Ordered   02/20/24 1329  For home use only DME Walker rolling  Once       Question Answer Comment  Walker: With 5 Inch Wheels   Patient needs a walker to treat with the following condition Debility      02/20/24 1328            Follow-up Information     [name], MD. Schedule an appointment as soon as possible for a visit in 1 week(s).   Specialty: Family Medicine Contact information: [address] [place] [address] [phone]         Bensimhon, Toribio SAUNDERS, MD. Schedule an appointment as soon as possible for a visit in 1 week(s).   Specialty: Cardiology Contact information: [address] [place] [address] [phone]                Allergies  Allergen Reactions   Lisinopril Cough   Magnesium  Nausea And Vomiting    Discharge Exam: Vitals:   02/22/24 0512 02/22/24 0822  BP: 120/85 (!) 140/96  Pulse: 85   Resp: 14   Temp: 97.7 F (36.5 C)   SpO2: 100%     Physical Exam  The results of significant diagnostics from this hospitalization (including imaging, microbiology, ancillary and laboratory) are listed below for reference.    Microbiology: Recent Results (from the past 240 hours)  Culture, blood (single) w Reflex to ID Panel     Status: Abnormal   Collection Time: 02/19/24  5:40 PM   Specimen: BLOOD RIGHT ARM  Result Value Ref Range Status   Specimen Description   Final    BLOOD RIGHT ARM Performed at [facility], [address], [place], [place] [address]    Special Requests   Final    BOTTLES DRAWN AEROBIC AND ANAEROBIC Blood Culture results may not be optimal due to an inadequate volume of blood received in culture bottles Performed at [facility], [address] [place], [place] [address]    Culture  Setup Time   Final    GRAM NEGATIVE RODS AEROBIC BOTTLE ONLY CRITICAL RESULT CALLED TO, READ BACK BY AND VERIFIED WITH: PHARMD A.UTOMWEN AT 1223 ON 02/20/2024 BY T.SAAD. Performed at [facility], [address], [place], [place] [address]    Culture KLEBSIELLA PNEUMONIAE (A)  Final   Report Status 02/22/2024 FINAL  Final   Organism ID, Bacteria KLEBSIELLA PNEUMONIAE  Final      Susceptibility   Klebsiella pneumoniae - MIC*    AMPICILLIN RESISTANT Resistant     CEFAZOLIN (NON-URINE) 2 SENSITIVE Sensitive     CEFEPIME <=0.12 SENSITIVE Sensitive     ERTAPENEM <=0.12 SENSITIVE Sensitive     CEFTRIAXONE <=0.25 SENSITIVE Sensitive     CIPROFLOXACIN <=0.06 SENSITIVE Sensitive     GENTAMICIN <=1 SENSITIVE Sensitive     MEROPENEM <=0.25 SENSITIVE Sensitive     TRIMETH/SULFA <=20 SENSITIVE Sensitive     AMPICILLIN/SULBACTAM <=2 SENSITIVE Sensitive     PIP/TAZO Value in next row Sensitive      <=4 SENSITIVEThis is a modified FDA-approved test that has been validated and its performance characteristics determined by the reporting laboratory.  This laboratory is certified under the Clinical Laboratory Improvement Amendments CLIA as qualified to perform high complexity clinical laboratory testing.    *  KLEBSIELLA PNEUMONIAE  Blood Culture ID Panel (Reflexed)     Status: Abnormal   Collection Time: 02/19/24  5:40 PM  Result Value Ref Range Status   Enterococcus faecalis NOT DETECTED NOT DETECTED Final   Enterococcus Faecium NOT DETECTED NOT DETECTED Final   Listeria monocytogenes NOT DETECTED NOT DETECTED Final   Staphylococcus species NOT DETECTED NOT DETECTED Final   Staphylococcus aureus (BCID) NOT DETECTED NOT DETECTED Final   Staphylococcus epidermidis NOT DETECTED NOT DETECTED Final   Staphylococcus lugdunensis NOT DETECTED NOT DETECTED Final   Streptococcus species NOT DETECTED NOT DETECTED Final   Streptococcus agalactiae NOT  DETECTED NOT DETECTED Final   Streptococcus pneumoniae NOT DETECTED NOT DETECTED Final   Streptococcus pyogenes NOT DETECTED NOT DETECTED Final   A.calcoaceticus-baumannii NOT DETECTED NOT DETECTED Final   Bacteroides fragilis NOT DETECTED NOT DETECTED Final   Enterobacterales DETECTED (A) NOT DETECTED Final    Comment: Enterobacterales represent a large order of gram negative bacteria, not a single organism. CRITICAL RESULT CALLED TO, READ BACK BY AND VERIFIED WITH: PHARMD A.UTOMWEN AT 1223 ON 02/20/2024 BY T.SAAD.    Enterobacter cloacae complex NOT DETECTED NOT DETECTED Final   Escherichia coli NOT DETECTED NOT DETECTED Final   Klebsiella aerogenes NOT DETECTED NOT DETECTED Final   Klebsiella oxytoca NOT DETECTED NOT DETECTED Final   Klebsiella pneumoniae DETECTED (A) NOT DETECTED Final    Comment: CRITICAL RESULT CALLED TO, READ BACK BY AND VERIFIED WITH: PHARMD A.UTOMWEN AT 1223 ON 02/20/2024 BY T.SAAD.    Proteus species NOT DETECTED NOT DETECTED Final   Salmonella species NOT DETECTED NOT DETECTED Final   Serratia marcescens NOT DETECTED NOT DETECTED Final   Haemophilus influenzae NOT DETECTED NOT DETECTED Final   Neisseria meningitidis NOT DETECTED NOT DETECTED Final   Pseudomonas aeruginosa NOT DETECTED NOT DETECTED Final   Stenotrophomonas maltophilia NOT DETECTED NOT DETECTED Final   Candida albicans NOT DETECTED NOT DETECTED Final   Candida auris NOT DETECTED NOT DETECTED Final   Candida glabrata NOT DETECTED NOT DETECTED Final   Candida krusei NOT DETECTED NOT DETECTED Final   Candida parapsilosis NOT DETECTED NOT DETECTED Final   Candida tropicalis NOT DETECTED NOT DETECTED Final   Cryptococcus neoformans/gattii NOT DETECTED NOT DETECTED Final   CTX-M ESBL NOT DETECTED NOT DETECTED Final   Carbapenem resistance IMP NOT DETECTED NOT DETECTED Final   Carbapenem resistance KPC NOT DETECTED NOT DETECTED Final   Carbapenem resistance NDM NOT DETECTED NOT DETECTED Final    Carbapenem resist OXA 48 LIKE NOT DETECTED NOT DETECTED Final   Carbapenem resistance VIM NOT DETECTED NOT DETECTED Final    Comment: Performed at [facility], [address] [place], [place] [address]     Labs: BNP (last 3 results) Recent Labs    10/01/23 1253  BNP 27.1   Basic Metabolic Panel: Recent Labs  Lab 02/16/24 2158 02/17/24 0556 02/18/24 0401 02/19/24 0435 02/20/24 0428 02/21/24 0438  NA 132* 135 135 139 138 138  K 3.8 3.2* 3.4* 3.4* 3.9 3.6  CL 91* 99 104 108 106 107  CO2 23 23 22 22  21* 21*  GLUCOSE 316* 208* 153* 108* 165* 145*  BUN 20 15 12 10 10 12   CREATININE 1.23 0.97 0.95 0.91 1.36* 1.08  CALCIUM  9.3 8.4* 7.8* 8.1* 8.3* 8.2*  MG 2.0  --  1.7 1.8  --  1.7   Liver Function Tests: Recent Labs  Lab 02/16/24 2158 02/17/24 0556 02/21/24 0438  AST 51* 46* 32  ALT 50* 43 45*  ALKPHOS 57 49 52  BILITOT 1.6* 1.2 0.5  PROT 7.2 6.0* 5.7*  ALBUMIN  4.0 3.5 3.2*   Recent Labs  Lab 02/16/24 2158  LIPASE 49   CBC: Recent Labs  Lab 02/16/24 2158 02/17/24 0556 02/21/24 0438  WBC 7.4 8.5 6.6  NEUTROABS 5.3  --  3.3  HGB 15.3 14.1 11.0*  HCT 46.3 42.8 34.3*  MCV 86.4 86.5 89.3  PLT 200 198 125*   CBG: Recent Labs  Lab 02/21/24 0715 02/21/24 1106 02/21/24 1654 02/21/24 2126 02/22/24 0731  GLUCAP 143* 195* 139* 233* 147*   Urinalysis    Component Value Date/Time   COLORURINE STRAW (A) 02/16/2024 2318   APPEARANCEUR CLEAR 02/16/2024 2318   LABSPEC 1.010 02/16/2024 2318   PHURINE 7.0 02/16/2024 2318   GLUCOSEU >=500 (A) 02/16/2024 2318   HGBUR NEGATIVE 02/16/2024 2318   BILIRUBINUR NEGATIVE 02/16/2024 2318   KETONESUR 20 (A) 02/16/2024 2318   PROTEINUR NEGATIVE 02/16/2024 2318   NITRITE NEGATIVE 02/16/2024 2318   LEUKOCYTESUR NEGATIVE 02/16/2024 2318   Sepsis Labs Recent Labs  Lab 02/16/24 2158 02/17/24 0556 02/21/24 0438  WBC 7.4 8.5 6.6    Procedures/Studies: ECHOCARDIOGRAM COMPLETE Result Date: 02/20/2024     ECHOCARDIOGRAM REPORT   Patient Name:   PERSEUS WESTALL Date of Exam: 02/20/2024 Medical Rec #:  [id number]  Height:       70.0 in Accession #:    [id number] Weight:       245.4 lb Date of Birth:  [birth year]  BSA:          2.277 m Patient Age:    52 years   BP:           139/92 mmHg Patient Gender: M          HR:           92 bpm. Exam Location:  Inpatient Procedure: 2D Echo, Cardiac Doppler, Color Doppler and Intracardiac            Opacification Agent (Both Spectral and Color Flow Doppler were            utilized during procedure). Indications:    I50.40* Unspecified combined systolic (congestive) and diastolic                 (congestive) heart failure  History:        Patient has prior history of Echocardiogram examinations, most                 recent 12/31/2023. CHF; Risk Factors:Hypertension and Diabetes.  Sonographer:    Ellouise Mose RDCS Referring Phys: [id number] [facility] A GRIFFITH  Sonographer Comments: Technically difficult study due to poor echo windows and patient is obese. Image acquisition challenging due to patient body habitus. Patient could not turn and had trouble holding breath. IMPRESSIONS  1. Left ventricular ejection fraction, by estimation, is 60 to 65%. The left ventricle has normal function. The left ventricle has no regional wall motion abnormalities. There is mild asymmetric left ventricular hypertrophy of the septal segment. Left ventricular diastolic parameters are consistent with Grade I diastolic dysfunction (impaired relaxation). Elevated left ventricular end-diastolic pressure.  2. Right ventricular systolic function is normal. The right ventricular size is normal. Severely increased right ventricular wall thickness. There is normal pulmonary artery systolic pressure.  3. A small pericardial effusion is present. The pericardial effusion is circumferential. There is no evidence of cardiac tamponade.  4. The mitral valve is normal in structure. Trivial  mitral valve regurgitation. No evidence of  mitral stenosis.  5. The aortic valve is tricuspid. Aortic valve regurgitation is not visualized. No aortic stenosis is present.  6. The inferior vena cava is normal in size with greater than 50% respiratory variability, suggesting right atrial pressure of 3 mmHg. FINDINGS  Left Ventricle: Left ventricular ejection fraction, by estimation, is 60 to 65%. The left ventricle has normal function. The left ventricle has no regional wall motion abnormalities. Global longitudinal strain performed but not reported based on interpreter judgement due to suboptimal tracking. The left ventricular internal cavity size was small. There is mild asymmetric left ventricular hypertrophy of the septal segment. Left ventricular diastolic parameters are consistent with Grade I diastolic dysfunction (impaired relaxation). Elevated left ventricular end-diastolic pressure. Right Ventricle: The right ventricular size is normal. Severely increased right ventricular wall thickness. Right ventricular systolic function is normal. There is normal pulmonary artery systolic pressure. The tricuspid regurgitant velocity is 1.64 m/s,  and with an assumed right atrial pressure of 3 mmHg, the estimated right ventricular systolic pressure is 13.8 mmHg. Left Atrium: Left atrial size was normal in size. Right Atrium: Right atrial size was normal in size. Pericardium: A small pericardial effusion is present. The pericardial effusion is circumferential. There is diastolic collapse of the right ventricular free wall. There is no evidence of cardiac tamponade. Presence of epicardial fat layer. Mitral Valve: The mitral valve is normal in structure. Trivial mitral valve regurgitation. No evidence of mitral valve stenosis. Tricuspid Valve: The tricuspid valve is normal in structure. Tricuspid valve regurgitation is trivial. No evidence of tricuspid stenosis. Aortic Valve: The aortic valve is tricuspid. Aortic valve regurgitation is not visualized. No aortic  stenosis is present. Pulmonic Valve: The pulmonic valve was normal in structure. Pulmonic valve regurgitation is not visualized. No evidence of pulmonic stenosis. Aorta: The aortic root and ascending aorta are structurally normal, with no evidence of dilitation. Venous: The inferior vena cava is normal in size with greater than 50% respiratory variability, suggesting right atrial pressure of 3 mmHg. IAS/Shunts: No atrial level shunt detected by color flow Doppler.  LEFT VENTRICLE PLAX 2D LVIDd:         5.00 cm     Diastology LVIDs:         3.70 cm     LV e' medial:    3.59 cm/s LV PW:         1.10 cm     LV E/e' medial:  19.1 LV IVS:        1.40 cm     LV e' lateral:   4.24 cm/s LVOT diam:     2.35 cm     LV E/e' lateral: 16.1 LV SV:         56 LV SV Index:   24          2D Longitudinal Strain LVOT Area:     4.34 cm    2D Strain GLS (A4C):   -14.2 %                            2D Strain GLS (A3C):   -4.5 %                            2D Strain GLS (A2C):   -11.1 % LV Volumes (MOD)           2D Strain GLS Avg:     -  9.9 % LV vol d, MOD A2C: 52.3 ml LV vol d, MOD A4C: 51.7 ml LV vol s, MOD A2C: 25.9 ml LV vol s, MOD A4C: 28.3 ml LV SV MOD A2C:     26.4 ml LV SV MOD A4C:     51.7 ml LV SV MOD BP:      26.3 ml RIGHT VENTRICLE             IVC RV S prime:     10.40 cm/s  IVC diam: 1.70 cm TAPSE (M-mode): 1.7 cm                             PULMONARY VEINS                             Diastolic Velocity: 31.90 cm/s                             S/D Velocity:       1.40                             Systolic Velocity:  44.30 cm/s LEFT ATRIUM             Index        RIGHT ATRIUM          Index LA diam:        3.30 cm 1.45 cm/m   RA Area:     5.02 cm LA Vol (A2C):   18.4 ml 8.08 ml/m   RA Volume:   6.96 ml  3.06 ml/m LA Vol (A4C):   29.6 ml 13.00 ml/m LA Biplane Vol: 23.2 ml 10.19 ml/m  AORTIC VALVE LVOT Vmax:   72.50 cm/s LVOT Vmean:  49.600 cm/s LVOT VTI:    0.128 m  AORTA Ao Root diam: 3.80 cm Ao Asc diam:  3.30 cm MITRAL  VALVE               TRICUSPID VALVE MV Area (PHT): 7.16 cm    TR Peak grad:   10.8 mmHg MV Decel Time: 106 msec    TR Vmax:        164.00 cm/s MV E velocity: 68.40 cm/s MV A velocity: 90.80 cm/s  SHUNTS MV E/A ratio:  0.75        Systemic VTI:  0.13 m                            Systemic Diam: 2.35 cm Annabella Scarce MD Electronically signed by Annabella Scarce MD Signature Date/Time: 02/20/2024/1:56:43 PM    Final    CT ABDOMEN PELVIS W CONTRAST Result Date: 02/17/2024 CLINICAL DATA:  Abdominal pain. EXAM: CT ABDOMEN AND PELVIS WITH CONTRAST TECHNIQUE: Multidetector CT imaging of the abdomen and pelvis was performed using the standard protocol following bolus administration of intravenous contrast. RADIATION DOSE REDUCTION: This exam was performed according to the departmental dose-optimization program which includes automated exposure control, adjustment of the mA and/or kV according to patient size and/or use of iterative reconstruction technique. CONTRAST:  OMNIPAQUE  IOHEXOL  300 MG/ML  SOLN COMPARISON:  December 30, 2023 FINDINGS: Lower chest: No acute abnormality. Hepatobiliary: There is diffuse fatty infiltration of the liver parenchyma. No focal liver abnormality is seen. No gallstones, gallbladder  wall thickening, or biliary dilatation. Pancreas: Unremarkable. No pancreatic ductal dilatation or surrounding inflammatory changes. Spleen: Normal in size without focal abnormality. Adrenals/Urinary Tract: Adrenal glands are unremarkable. Kidneys are normal, without renal calculi, focal lesion, or hydronephrosis. The ureter bladder is moderately distended and is otherwise unremarkable. Stomach/Bowel: Stomach is within normal limits. Appendix appears normal. No evidence of bowel wall thickening, distention, or inflammatory changes. Vascular/Lymphatic: Aortic atherosclerosis. No enlarged abdominal or pelvic lymph nodes. Reproductive: Prostate is unremarkable. Other: No abdominal wall hernia or abnormality.  No abdominopelvic ascites. Musculoskeletal: No acute or significant osseous findings. IMPRESSION: 1. Hepatic steatosis. 2. No acute or active process within the abdomen or pelvis. 3. Aortic atherosclerosis. Electronically Signed   By: Suzen Dials M.D.   On: 02/17/2024 00:20    Time coordinating discharge: 60 mins  SIGNED:  Camellia Door, DO [facility] 02/22/24, 10:08 AM

## 2024-02-22 NOTE — Plan of Care (Signed)
 Problem: Education: Goal: Ability to describe self-care measures that may prevent or decrease complications (Diabetes Survival Skills Education) will improve 02/22/2024 1239 by Ivery Chiquita CROME, RN Outcome: Completed/Met 02/22/2024 1239 by Ivery Chiquita CROME, RN Outcome: Adequate for Discharge Goal: Individualized Educational Video(s) 02/22/2024 1239 by Ivery Chiquita CROME, RN Outcome: Completed/Met 02/22/2024 1239 by Ivery Chiquita CROME, RN Outcome: Adequate for Discharge   Problem: Coping: Goal: Ability to adjust to condition or change in health will improve 02/22/2024 1239 by Ivery Chiquita CROME, RN Outcome: Completed/Met 02/22/2024 1239 by Ivery Chiquita CROME, RN Outcome: Adequate for Discharge   Problem: Fluid Volume: Goal: Ability to maintain a balanced intake and output will improve 02/22/2024 1239 by Ivery Chiquita CROME, RN Outcome: Completed/Met 02/22/2024 1239 by Ivery Chiquita CROME, RN Outcome: Adequate for Discharge   Problem: Health Behavior/Discharge Planning: Goal: Ability to identify and utilize available resources and services will improve 02/22/2024 1239 by Ivery Chiquita CROME, RN Outcome: Completed/Met 02/22/2024 1239 by Ivery Chiquita CROME, RN Outcome: Adequate for Discharge Goal: Ability to manage health-related needs will improve 02/22/2024 1239 by Ivery Chiquita CROME, RN Outcome: Completed/Met 02/22/2024 1239 by Ivery Chiquita CROME, RN Outcome: Adequate for Discharge   Problem: Metabolic: Goal: Ability to maintain appropriate glucose levels will improve 02/22/2024 1239 by Ivery Chiquita CROME, RN Outcome: Completed/Met 02/22/2024 1239 by Ivery Chiquita CROME, RN Outcome: Adequate for Discharge   Problem: Nutritional: Goal: Maintenance of adequate nutrition will improve 02/22/2024 1239 by Ivery Chiquita CROME, RN Outcome: Completed/Met 02/22/2024 1239 by Ivery Chiquita CROME, RN Outcome: Adequate for Discharge Goal: Progress toward achieving an optimal weight will improve 02/22/2024 1239 by Ivery Chiquita CROME, RN Outcome:  Completed/Met 02/22/2024 1239 by Ivery Chiquita CROME, RN Outcome: Adequate for Discharge   Problem: Skin Integrity: Goal: Risk for impaired skin integrity will decrease 02/22/2024 1239 by Ivery Chiquita CROME, RN Outcome: Completed/Met 02/22/2024 1239 by Ivery Chiquita CROME, RN Outcome: Adequate for Discharge   Problem: Tissue Perfusion: Goal: Adequacy of tissue perfusion will improve 02/22/2024 1239 by Ivery Chiquita CROME, RN Outcome: Completed/Met 02/22/2024 1239 by Ivery Chiquita CROME, RN Outcome: Adequate for Discharge   Problem: Education: Goal: Knowledge of General Education information will improve Description: Including pain rating scale, medication(s)/side effects and non-pharmacologic comfort measures 02/22/2024 1239 by Ivery Chiquita CROME, RN Outcome: Completed/Met 02/22/2024 1239 by Ivery Chiquita CROME, RN Outcome: Adequate for Discharge   Problem: Health Behavior/Discharge Planning: Goal: Ability to manage health-related needs will improve 02/22/2024 1239 by Ivery Chiquita CROME, RN Outcome: Completed/Met 02/22/2024 1239 by Ivery Chiquita CROME, RN Outcome: Adequate for Discharge   Problem: Clinical Measurements: Goal: Ability to maintain clinical measurements within normal limits will improve 02/22/2024 1239 by Ivery Chiquita CROME, RN Outcome: Completed/Met 02/22/2024 1239 by Ivery Chiquita CROME, RN Outcome: Adequate for Discharge Goal: Will remain free from infection 02/22/2024 1239 by Ivery Chiquita CROME, RN Outcome: Completed/Met 02/22/2024 1239 by Ivery Chiquita CROME, RN Outcome: Adequate for Discharge Goal: Diagnostic test results will improve 02/22/2024 1239 by Ivery Chiquita CROME, RN Outcome: Completed/Met 02/22/2024 1239 by Ivery Chiquita CROME, RN Outcome: Adequate for Discharge Goal: Respiratory complications will improve 02/22/2024 1239 by Ivery Chiquita CROME, RN Outcome: Completed/Met 02/22/2024 1239 by Ivery Chiquita CROME, RN Outcome: Adequate for Discharge Goal: Cardiovascular complication will be avoided 02/22/2024 1239  by Ivery Chiquita CROME, RN Outcome: Completed/Met 02/22/2024 1239 by Ivery Chiquita CROME, RN Outcome: Adequate for Discharge   Problem: Activity: Goal: Risk for activity intolerance will decrease 02/22/2024 1239 by Ivery Chiquita CROME, RN Outcome: Completed/Met 02/22/2024 1239  by Ivery Chiquita CROME, RN Outcome: Adequate for Discharge   Problem: Nutrition: Goal: Adequate nutrition will be maintained 02/22/2024 1239 by Ivery Chiquita CROME, RN Outcome: Completed/Met 02/22/2024 1239 by Ivery Chiquita CROME, RN Outcome: Adequate for Discharge   Problem: Coping: Goal: Level of anxiety will decrease 02/22/2024 1239 by Ivery Chiquita CROME, RN Outcome: Completed/Met 02/22/2024 1239 by Ivery Chiquita CROME, RN Outcome: Adequate for Discharge   Problem: Elimination: Goal: Will not experience complications related to bowel motility 02/22/2024 1239 by Ivery Chiquita CROME, RN Outcome: Completed/Met 02/22/2024 1239 by Ivery Chiquita CROME, RN Outcome: Adequate for Discharge Goal: Will not experience complications related to urinary retention 02/22/2024 1239 by Ivery Chiquita CROME, RN Outcome: Completed/Met 02/22/2024 1239 by Ivery Chiquita CROME, RN Outcome: Adequate for Discharge   Problem: Pain Managment: Goal: General experience of comfort will improve and/or be controlled 02/22/2024 1239 by Ivery Chiquita CROME, RN Outcome: Completed/Met 02/22/2024 1239 by Ivery Chiquita CROME, RN Outcome: Adequate for Discharge   Problem: Safety: Goal: Ability to remain free from injury will improve 02/22/2024 1239 by Ivery Chiquita CROME, RN Outcome: Completed/Met 02/22/2024 1239 by Ivery Chiquita CROME, RN Outcome: Adequate for Discharge   Problem: Skin Integrity: Goal: Risk for impaired skin integrity will decrease 02/22/2024 1239 by Ivery Chiquita CROME, RN Outcome: Completed/Met 02/22/2024 1239 by Ivery Chiquita CROME, RN Outcome: Adequate for Discharge

## 2024-02-22 NOTE — Progress Notes (Signed)
 Physical Therapy Treatment Patient Details Name: Terry Young MRN: [id number] DOB: 1970-05-14 Today's Date: 02/22/2024   History of Present Illness 53 yo male  presents to therapy following hospital admission on 02/16/2024 due to abdominal pain with N and V. Pt found to have abn labs including troponin, lactic acidosis, CT of pelvis/abdomen negative for acute findings however revealed hepatic steatosis, and Bp and HR elevated. Pt PMH includes but is not limited to: HTN, DM II, CKD IIIa, and CHF.    PT Comments   Pt admitted with above diagnosis.  Pt currently with functional limitations due to the deficits listed below (see PT Problem List). Pt seated EOB when PT arrived. Pt reports signs and symptoms associated with medication patch have mostly subsided and no N or V. Pt continues to endorse abdominal soreness. Pt reports no dizziness with positional changes today. Pt reported anticipated d/c today and expressed concern per navigating steps, pt LE instability and poor activity tolerance limiting step navigation training at this time. Pt has been provided with RW and adjusted to appropriate height for increased B UE support to offload B LE- energy conservation, safety and stability however pt currently does not have HH set up. Pt required CGA for sit to stand  from EOB, CGA for gait tasks progressing to 40 feet in hallway with initial B LE instability however no overt LOB. Pt limited due to fatigue. Pt left seated in recliner, all needs in place. Pt will benefit from acute skilled PT to increase their independence and safety with mobility to allow discharge.   Bp seated EOB 112/81 (98 PR) Bp immediate standing 129/88 (108 PR)   If plan is discharge home, recommend the following: A little help with walking and/or transfers;A little help with bathing/dressing/bathroom;Assistance with cooking/housework;Assist for transportation;Help with stairs or ramp for entrance   Can travel by private vehicle         Equipment Recommendations  Rolling walker (2 wheels)    Recommendations for Other Services       Precautions / Restrictions Precautions Precautions: Fall (orthostatic) Restrictions Weight Bearing Restrictions Per Provider Order: No     Mobility  Bed Mobility Overal bed mobility: Modified Independent             General bed mobility comments: pt seated EOB when PT arrived    Transfers Overall transfer level: Needs assistance Equipment used: Rolling walker (2 wheels) Transfers: Sit to/from Stand Sit to Stand: Contact guard assist           General transfer comment: min cues, push to stand with R UE instaibitliy with inital standing however no reports of dizzines    Ambulation/Gait Ambulation/Gait assistance: Contact guard assist Gait Distance (Feet): 40 Feet Assistive device: Rolling walker (2 wheels) Gait Pattern/deviations: Step-to pattern, Knee flexed in stance - right, Knee flexed in stance - left, Wide base of support Gait velocity: decreased     General Gait Details: pt demonstrated improved B LE stabiltiy with gait tasks and able to increase gait tolerance by 5 feet,  pt exhibits decreased stride lenth, minimal foot clearance, and flexed posture, pt reporting fatigue with gait, unsafe at this time for step navigation training   Stairs             Wheelchair Mobility     Tilt Bed    Modified Rankin (Stroke Patients Only)       Balance Overall balance assessment: History of Falls, Needs assistance Sitting-balance support: Feet supported Sitting balance-Leahy Scale: Good  Standing balance support: Bilateral upper extremity supported, During functional activity, Reliant on assistive device for balance Standing balance-Leahy Scale: Poor Standing balance comment: B LE instability x1 no overt LOB                            Communication Communication Communication: No apparent difficulties  Cognition Arousal:  Alert Behavior During Therapy: WFL for tasks assessed/performed   PT - Cognitive impairments: No apparent impairments                         Following commands: Intact      Cueing    Exercises      General Comments        Pertinent Vitals/Pain Pain Assessment Pain Assessment: Faces Faces Pain Scale: Hurts a little bit Pain Location: abdomen Pain Descriptors / Indicators: Sore Pain Intervention(s): Monitored during session    Home Living                          Prior Function            PT Goals (current goals can now be found in the care plan section) Acute Rehab PT Goals Patient Stated Goal: to be able to get stronger go home and start cooking again PT Goal Formulation: With patient Time For Goal Achievement: 03/04/24 Potential to Achieve Goals: Good Progress towards PT goals: Progressing toward goals    Frequency    Min 3X/week      PT Plan      Co-evaluation              AM-PAC PT 6 Clicks Mobility   Outcome Measure  Help needed turning from your back to your side while in a flat bed without using bedrails?: None Help needed moving from lying on your back to sitting on the side of a flat bed without using bedrails?: None Help needed moving to and from a bed to a chair (including a wheelchair)?: A Little Help needed standing up from a chair using your arms (e.g., wheelchair or bedside chair)?: A Little Help needed to walk in hospital room?: A Little Help needed climbing 3-5 steps with a railing? : Total 6 Click Score: 18    End of Session Equipment Utilized During Treatment: Gait belt Activity Tolerance: Patient limited by fatigue Patient left: in chair;with call bell/phone within reach Nurse Communication: Mobility status PT Visit Diagnosis: Unsteadiness on feet (R26.81);Other abnormalities of gait and mobility (R26.89);Muscle weakness (generalized) (M62.81);History of falling (Z91.81);Difficulty in walking, not  elsewhere classified (R26.2);Pain Pain - part of body:  (abdomen)     Time: 8963-8943 PT Time Calculation (min) (ACUTE ONLY): 20 min  Charges:    $Gait Training: 8-22 mins PT General Charges $$ ACUTE PT VISIT: 1 Visit                     [place], PT Acute Rehab    [place] VEAR Drone 02/22/2024, 11:40 AM

## 2024-02-22 NOTE — Progress Notes (Signed)
 PROGRESS NOTE    Terry Young  FMW:[id number] DOB: [birth year] DOA: 02/16/2024 PCP: [name], MD  Subjective: Patient seen and examined.  His Klebsiella pneumoniae septicemia MIC's are back.  Sensitive to Ancef and Rocephin.  He can safely transition to p.o. Duricef.  Patient is feeling better.  Ate a spaghetti dinner last night.  No vomiting.  Having mild diarrhea.  Stable for discharge.  He wants to go home.   Hospital Course: CC: N/V HPI: Terry Young is a 53 y.o. male with medical history significant for hypertension, diabetes mellitus, CKD 3A, and chronic CHF with improved EF who presents with abdominal pain, nausea, and vomiting.   Patient was admitted with similar symptoms in September, presentation was suspected secondary to Ozempic  which she had been uptitrating, that he has not been taking that since and reports ongoing symptoms which have worsened significantly over the past couple days.   He denies diarrhea, fever, chills, sick contacts, or recent travel.   ED Course: Upon arrival to the ED, patient is found to be afebrile and saturating well on room air with mild tachycardia and elevated BP.  Labs are most notable for glucose 316, creatinine 1.23, AST 51, ALT 50, total bilirubin 1.6, normal lipase, normal WBC, lactate 4.3, and troponin 61.  CT is negative for acute findings in the abdomen or pelvis.   Patient was given 1.5 L of LR and a dose of Reglan .  Significant Events: Admitted 02/16/2024 intractable N/V 11/2 -- afternoon, pt became hypotensive.  He had been given Entresto , Coreg , hydralazine  and Imdur  in the AM.  BP improved with additional IV fluids and 10 mg midodrine.  11/3 -- BP's still soft, pt feeling weak and lightheaded when up OOB.  Nausea improved. Advance diet to fulls.    Admission Labs: WBC 7.4, HgB 15.3, plt 200 Na 132, K 3.8, CO2 of 23, Bun 20, Scr 1.23, glu 316 Lipase 49 Mg 2.0 VBG pH 7.47, PCO2 of 35 Lactic acid 3.5  Admission Imaging  Studies: CT abd/pelvis Hepatic steatosis. 2. No acute or active process within the abdomen or pelvis. 3. Aortic atherosclerosis  Significant Labs:   Significant Imaging Studies: Echo today shows 60 to 65%   Antibiotic Therapy: Anti-infectives (From admission, onward)    None       Procedures:   Consultants:     Assessment and Plan: * Intractable nausea and vomiting-resolved as of 02/21/2024 02/20/24 unclear the exact cause of his intractable nausea vomiting.  He is having problems with accommodation due to the scopolamine patch along with difficulty walking, orthostasis and dry mouth.  Will go ahead and stop his scopolamine patch.  Continue with IV fluids for now.  02/21/24 resolved. Stop IVF.    Bacteremia due to Klebsiella pneumoniae 02/20/24  Unclear where his Klebsiella pneumonia a bacteremia is from.  Started on IV Rocephin.  His CT abdomen pelvis was negative for any acute pathology.  He is on room air.  I do not think he has pneumonia.  Urinalysis was not impressive for acute cystitis.  02/21/24 pt feeling better. Unclear the source of his gram negative septicemia. Awaiting final MIC. Pt states he has been sick since September 2025. I doubt he has been septicemic for 2 months.   02/22/24 His Klebsiella pneumoniae septicemia MIC's are back. Sensitive to Ancef and Rocephin. He can safely transition to p.o. Duricef.  Source is still unknown.  CT abdomen pelvis was negative for any intra-abdominal pathology.  His chest x-ray was negative for  pneumonia.  Will treat him with a total 10-day course of antibiotics including his IV Rocephin he received in the hospital.    Prolonged QT interval 02/20/24 repeat EKG.  Potassium is 3.9 and his magnesium  is 1.8.  02/21/24 QTC still 557 msec. But improved   CKD stage 3a, GFR 45-59 ml/min (HCC) 02/20/24 Creatinine 1.36 today.  Stable.  02/21/24 Scr down to 1.08 today. Stop IVF.  02/22/24 stable.  Creatinine 1.08  yesterday.    Class II obesity Body mass index is 35.21 kg/m.   Heart failure with improved ejection fraction (HFimpEF) (HCC) 02/20/24 appears euvolemic.  Continue on Coreg , Imdur .  Holding on hydralazine  and Lasix  due to orthostasis today. Echo today shows 60 to 65%   02/21/24 stable. Is no longer orthostatic. Continue with just coreg  and imdur . Still holding lasix  and hydralazine .  02/22/24 Echo showed normal LVEF of 60 to 65%.  Remains on Imdur  and Coreg .  Will have him hold his Lasix  and his hydralazine  until he is off of antibiotics.  Also holding his Entresto  while he is on antibiotics.  He will need to see his cardiologist or PCP back in the office before he restarts these.   DM2 (diabetes mellitus, type 2) (HCC) 02/20/24 continue with sliding scale insulin .  02/21/24 stable. On SSI.  02/22/24 stable.  He can restart his metformin , Jardiance  at home.   DVT prophylaxis:   Lovenox    Code Status: Full Code Family Communication: no family at bedside. He is decisional. Disposition Plan: home Reason for continuing need for hospitalization: stable for DC today.  Objective: Vitals:   02/21/24 2011 02/22/24 0500 02/22/24 0512 02/22/24 0822  BP: 118/74  120/85 (!) 140/96  Pulse: (!) 101  85   Resp: 16  14   Temp: 99.5 F (37.5 C)  97.7 F (36.5 C)   TempSrc: Oral  Oral   SpO2: 100%  100%   Weight:  114.8 kg    Height:        Intake/Output Summary (Last 24 hours) at 02/22/2024 0957 Last data filed at 02/21/2024 2215 Gross per 24 hour  Intake 717 ml  Output 1100 ml  Net -383 ml   Filed Weights   02/20/24 0416 02/21/24 0500 02/22/24 0500  Weight: 111.3 kg 115.5 kg 114.8 kg    Examination:  Physical Exam Vitals and nursing note reviewed.  Constitutional:      General: He is not in acute distress.    Appearance: He is obese. He is not toxic-appearing or diaphoretic.  HENT:     Head: Normocephalic and atraumatic.     Nose: Nose normal.  Eyes:     General:  No scleral icterus. Cardiovascular:     Rate and Rhythm: Normal rate and regular rhythm.  Pulmonary:     Effort: Pulmonary effort is normal.     Breath sounds: Normal breath sounds.  Abdominal:     General: Bowel sounds are normal. There is no distension.     Palpations: Abdomen is soft.  Musculoskeletal:     Right lower leg: No edema.     Left lower leg: No edema.  Skin:    General: Skin is warm and dry.     Capillary Refill: Capillary refill takes less than 2 seconds.  Neurological:     General: No focal deficit present.     Mental Status: He is alert and oriented to person, place, and time.     Data Reviewed: I have personally reviewed following labs and  imaging studies  CBC: Recent Labs  Lab 02/16/24 2158 02/17/24 0556 02/21/24 0438  WBC 7.4 8.5 6.6  NEUTROABS 5.3  --  3.3  HGB 15.3 14.1 11.0*  HCT 46.3 42.8 34.3*  MCV 86.4 86.5 89.3  PLT 200 198 125*   Basic Metabolic Panel: Recent Labs  Lab 02/16/24 2158 02/17/24 0556 02/18/24 0401 02/19/24 0435 02/20/24 0428 02/21/24 0438  NA 132* 135 135 139 138 138  K 3.8 3.2* 3.4* 3.4* 3.9 3.6  CL 91* 99 104 108 106 107  CO2 23 23 22 22  21* 21*  GLUCOSE 316* 208* 153* 108* 165* 145*  BUN 20 15 12 10 10 12   CREATININE 1.23 0.97 0.95 0.91 1.36* 1.08  CALCIUM  9.3 8.4* 7.8* 8.1* 8.3* 8.2*  MG 2.0  --  1.7 1.8  --  1.7   GFR: Estimated Creatinine Clearance: 102.7 mL/min (by C-G formula based on SCr of 1.08 mg/dL). Liver Function Tests: Recent Labs  Lab 02/16/24 2158 02/17/24 0556 02/21/24 0438  AST 51* 46* 32  ALT 50* 43 45*  ALKPHOS 57 49 52  BILITOT 1.6* 1.2 0.5  PROT 7.2 6.0* 5.7*  ALBUMIN  4.0 3.5 3.2*   Recent Labs  Lab 02/16/24 2158  LIPASE 49   BNP (last 3 results) Recent Labs    10/01/23 1253  BNP 27.1   CBG: Recent Labs  Lab 02/21/24 0715 02/21/24 1106 02/21/24 1654 02/21/24 2126 02/22/24 0731  GLUCAP 143* 195* 139* 233* 147*   Sepsis Labs: Recent Labs  Lab 02/16/24 2205  02/17/24 0022 02/17/24 0557 02/19/24 0435  LATICACIDVEN 3.5* 4.3* 2.3* 1.8    Recent Results (from the past 240 hours)  Culture, blood (single) w Reflex to ID Panel     Status: Abnormal   Collection Time: 02/19/24  5:40 PM   Specimen: BLOOD RIGHT ARM  Result Value Ref Range Status   Specimen Description   Final    BLOOD RIGHT ARM Performed at [facility], [address] [place], [place] [address]    Special Requests   Final    BOTTLES DRAWN AEROBIC AND ANAEROBIC Blood Culture results may not be optimal due to an inadequate volume of blood received in culture bottles Performed at [facility], [address], [place], [place] [address]    Culture  Setup Time   Final    GRAM NEGATIVE RODS AEROBIC BOTTLE ONLY CRITICAL RESULT CALLED TO, READ BACK BY AND VERIFIED WITH: PHARMD A.UTOMWEN AT 1223 ON 02/20/2024 BY T.SAAD. Performed at [facility], [address] [place], [place] [address]    Culture KLEBSIELLA PNEUMONIAE (A)  Final   Report Status 02/22/2024 FINAL  Final   Organism ID, Bacteria KLEBSIELLA PNEUMONIAE  Final      Susceptibility   Klebsiella pneumoniae - MIC*    AMPICILLIN RESISTANT Resistant     CEFAZOLIN (NON-URINE) 2 SENSITIVE Sensitive     CEFEPIME <=0.12 SENSITIVE Sensitive     ERTAPENEM <=0.12 SENSITIVE Sensitive     CEFTRIAXONE <=0.25 SENSITIVE Sensitive     CIPROFLOXACIN <=0.06 SENSITIVE Sensitive     GENTAMICIN <=1 SENSITIVE Sensitive     MEROPENEM <=0.25 SENSITIVE Sensitive     TRIMETH/SULFA <=20 SENSITIVE Sensitive     AMPICILLIN/SULBACTAM <=2 SENSITIVE Sensitive     PIP/TAZO Value in next row Sensitive      <=4 SENSITIVEThis is a modified FDA-approved test that has been validated and its performance characteristics determined by the reporting laboratory.  This laboratory  is certified under the Clinical Laboratory Improvement Amendments CLIA as qualified to perform high complexity clinical laboratory testing.    *  KLEBSIELLA PNEUMONIAE  Blood Culture ID Panel (Reflexed)     Status: Abnormal   Collection Time: 02/19/24  5:40 PM  Result Value Ref Range Status   Enterococcus faecalis NOT DETECTED NOT DETECTED Final   Enterococcus Faecium NOT DETECTED NOT DETECTED Final   Listeria monocytogenes NOT DETECTED NOT DETECTED Final   Staphylococcus species NOT DETECTED NOT DETECTED Final   Staphylococcus aureus (BCID) NOT DETECTED NOT DETECTED Final   Staphylococcus epidermidis NOT DETECTED NOT DETECTED Final   Staphylococcus lugdunensis NOT DETECTED NOT DETECTED Final   Streptococcus species NOT DETECTED NOT DETECTED Final   Streptococcus agalactiae NOT DETECTED NOT DETECTED Final   Streptococcus pneumoniae NOT DETECTED NOT DETECTED Final   Streptococcus pyogenes NOT DETECTED NOT DETECTED Final   A.calcoaceticus-baumannii NOT DETECTED NOT DETECTED Final   Bacteroides fragilis NOT DETECTED NOT DETECTED Final   Enterobacterales DETECTED (A) NOT DETECTED Final    Comment: Enterobacterales represent a large order of gram negative bacteria, not a single organism. CRITICAL RESULT CALLED TO, READ BACK BY AND VERIFIED WITH: PHARMD A.UTOMWEN AT 1223 ON 02/20/2024 BY T.SAAD.    Enterobacter cloacae complex NOT DETECTED NOT DETECTED Final   Escherichia coli NOT DETECTED NOT DETECTED Final   Klebsiella aerogenes NOT DETECTED NOT DETECTED Final   Klebsiella oxytoca NOT DETECTED NOT DETECTED Final   Klebsiella pneumoniae DETECTED (A) NOT DETECTED Final    Comment: CRITICAL RESULT CALLED TO, READ BACK BY AND VERIFIED WITH: PHARMD A.UTOMWEN AT 1223 ON 02/20/2024 BY T.SAAD.    Proteus species NOT DETECTED NOT DETECTED Final   Salmonella species NOT DETECTED NOT DETECTED Final   Serratia marcescens NOT DETECTED NOT DETECTED Final   Haemophilus influenzae NOT DETECTED NOT DETECTED Final   Neisseria meningitidis NOT DETECTED NOT DETECTED Final   Pseudomonas aeruginosa NOT DETECTED NOT DETECTED Final   Stenotrophomonas  maltophilia NOT DETECTED NOT DETECTED Final   Candida albicans NOT DETECTED NOT DETECTED Final   Candida auris NOT DETECTED NOT DETECTED Final   Candida glabrata NOT DETECTED NOT DETECTED Final   Candida krusei NOT DETECTED NOT DETECTED Final   Candida parapsilosis NOT DETECTED NOT DETECTED Final   Candida tropicalis NOT DETECTED NOT DETECTED Final   Cryptococcus neoformans/gattii NOT DETECTED NOT DETECTED Final   CTX-M ESBL NOT DETECTED NOT DETECTED Final   Carbapenem resistance IMP NOT DETECTED NOT DETECTED Final   Carbapenem resistance KPC NOT DETECTED NOT DETECTED Final   Carbapenem resistance NDM NOT DETECTED NOT DETECTED Final   Carbapenem resist OXA 48 LIKE NOT DETECTED NOT DETECTED Final   Carbapenem resistance VIM NOT DETECTED NOT DETECTED Final    Comment: Performed at [facility], [address] [place], [place] [address]     Radiology Studies: ECHOCARDIOGRAM COMPLETE Result Date: 02/20/2024    ECHOCARDIOGRAM REPORT   Patient Name:   KEEN EWALT Date of Exam: 02/20/2024 Medical Rec #:  [id number]  Height:       70.0 in Accession #:    [id number] Weight:       245.4 lb Date of Birth:  04/15/1971  BSA:          2.277 m Patient Age:    52 years   BP:           139/92 mmHg Patient Gender: M          HR:  92 bpm. Exam Location:  Inpatient Procedure: 2D Echo, Cardiac Doppler, Color Doppler and Intracardiac            Opacification Agent (Both Spectral and Color Flow Doppler were            utilized during procedure). Indications:    I50.40* Unspecified combined systolic (congestive) and diastolic                 (congestive) heart failure  History:        Patient has prior history of Echocardiogram examinations, most                 recent 12/31/2023. CHF; Risk Factors:Hypertension and Diabetes.  Sonographer:    Ellouise Mose RDCS Referring Phys: [id number] [facility] A GRIFFITH  Sonographer Comments: Technically difficult study due to poor echo windows and patient is obese. Image  acquisition challenging due to patient body habitus. Patient could not turn and had trouble holding breath. IMPRESSIONS  1. Left ventricular ejection fraction, by estimation, is 60 to 65%. The left ventricle has normal function. The left ventricle has no regional wall motion abnormalities. There is mild asymmetric left ventricular hypertrophy of the septal segment. Left ventricular diastolic parameters are consistent with Grade I diastolic dysfunction (impaired relaxation). Elevated left ventricular end-diastolic pressure.  2. Right ventricular systolic function is normal. The right ventricular size is normal. Severely increased right ventricular wall thickness. There is normal pulmonary artery systolic pressure.  3. A small pericardial effusion is present. The pericardial effusion is circumferential. There is no evidence of cardiac tamponade.  4. The mitral valve is normal in structure. Trivial mitral valve regurgitation. No evidence of mitral stenosis.  5. The aortic valve is tricuspid. Aortic valve regurgitation is not visualized. No aortic stenosis is present.  6. The inferior vena cava is normal in size with greater than 50% respiratory variability, suggesting right atrial pressure of 3 mmHg. FINDINGS  Left Ventricle: Left ventricular ejection fraction, by estimation, is 60 to 65%. The left ventricle has normal function. The left ventricle has no regional wall motion abnormalities. Global longitudinal strain performed but not reported based on interpreter judgement due to suboptimal tracking. The left ventricular internal cavity size was small. There is mild asymmetric left ventricular hypertrophy of the septal segment. Left ventricular diastolic parameters are consistent with Grade I diastolic dysfunction (impaired relaxation). Elevated left ventricular end-diastolic pressure. Right Ventricle: The right ventricular size is normal. Severely increased right ventricular wall thickness. Right ventricular systolic  function is normal. There is normal pulmonary artery systolic pressure. The tricuspid regurgitant velocity is 1.64 m/s,  and with an assumed right atrial pressure of 3 mmHg, the estimated right ventricular systolic pressure is 13.8 mmHg. Left Atrium: Left atrial size was normal in size. Right Atrium: Right atrial size was normal in size. Pericardium: A small pericardial effusion is present. The pericardial effusion is circumferential. There is diastolic collapse of the right ventricular free wall. There is no evidence of cardiac tamponade. Presence of epicardial fat layer. Mitral Valve: The mitral valve is normal in structure. Trivial mitral valve regurgitation. No evidence of mitral valve stenosis. Tricuspid Valve: The tricuspid valve is normal in structure. Tricuspid valve regurgitation is trivial. No evidence of tricuspid stenosis. Aortic Valve: The aortic valve is tricuspid. Aortic valve regurgitation is not visualized. No aortic stenosis is present. Pulmonic Valve: The pulmonic valve was normal in structure. Pulmonic valve regurgitation is not visualized. No evidence of pulmonic stenosis. Aorta: The aortic root and ascending aorta  are structurally normal, with no evidence of dilitation. Venous: The inferior vena cava is normal in size with greater than 50% respiratory variability, suggesting right atrial pressure of 3 mmHg. IAS/Shunts: No atrial level shunt detected by color flow Doppler.  LEFT VENTRICLE PLAX 2D LVIDd:         5.00 cm     Diastology LVIDs:         3.70 cm     LV e' medial:    3.59 cm/s LV PW:         1.10 cm     LV E/e' medial:  19.1 LV IVS:        1.40 cm     LV e' lateral:   4.24 cm/s LVOT diam:     2.35 cm     LV E/e' lateral: 16.1 LV SV:         56 LV SV Index:   24          2D Longitudinal Strain LVOT Area:     4.34 cm    2D Strain GLS (A4C):   -14.2 %                            2D Strain GLS (A3C):   -4.5 %                            2D Strain GLS (A2C):   -11.1 % LV Volumes (MOD)            2D Strain GLS Avg:     -9.9 % LV vol d, MOD A2C: 52.3 ml LV vol d, MOD A4C: 51.7 ml LV vol s, MOD A2C: 25.9 ml LV vol s, MOD A4C: 28.3 ml LV SV MOD A2C:     26.4 ml LV SV MOD A4C:     51.7 ml LV SV MOD BP:      26.3 ml RIGHT VENTRICLE             IVC RV S prime:     10.40 cm/s  IVC diam: 1.70 cm TAPSE (M-mode): 1.7 cm                             PULMONARY VEINS                             Diastolic Velocity: 31.90 cm/s                             S/D Velocity:       1.40                             Systolic Velocity:  44.30 cm/s LEFT ATRIUM             Index        RIGHT ATRIUM          Index LA diam:        3.30 cm 1.45 cm/m   RA Area:     5.02 cm LA Vol (A2C):   18.4 ml 8.08 ml/m   RA Volume:   6.96 ml  3.06 ml/m LA Vol (A4C):   29.6 ml 13.00 ml/m LA Biplane Vol: 23.2 ml  10.19 ml/m  AORTIC VALVE LVOT Vmax:   72.50 cm/s LVOT Vmean:  49.600 cm/s LVOT VTI:    0.128 m  AORTA Ao Root diam: 3.80 cm Ao Asc diam:  3.30 cm MITRAL VALVE               TRICUSPID VALVE MV Area (PHT): 7.16 cm    TR Peak grad:   10.8 mmHg MV Decel Time: 106 msec    TR Vmax:        164.00 cm/s MV E velocity: 68.40 cm/s MV A velocity: 90.80 cm/s  SHUNTS MV E/A ratio:  0.75        Systemic VTI:  0.13 m                            Systemic Diam: 2.35 cm Annabella Scarce MD Electronically signed by Annabella Scarce MD Signature Date/Time: 02/20/2024/1:56:43 PM    Final     Scheduled Meds:  aspirin  EC  81 mg Oral Daily   atorvastatin   40 mg Oral Daily   carvedilol   3.125 mg Oral BID WC   enoxaparin  (LOVENOX ) injection  50 mg Subcutaneous Q24H   feeding supplement  1 Container Oral TID BM   insulin  aspart  0-15 Units Subcutaneous TID WC   insulin  aspart  0-5 Units Subcutaneous QHS   isosorbide  mononitrate  60 mg Oral Daily   pregabalin   75 mg Oral BID   sodium chloride  flush  3 mL Intravenous Q12H   Continuous Infusions:  cefTRIAXone (ROCEPHIN)  IV 2 g (02/21/24 1440)   magnesium  sulfate bolus IVPB Stopped (02/18/24 1228)      LOS: 5 days   Time spent: 60 minutes  Camellia Door, DO  [facility]  02/22/2024, 9:57 AM

## 2024-02-25 ENCOUNTER — Telehealth: Payer: Self-pay | Admitting: *Deleted

## 2024-02-25 NOTE — Transitions of Care (Post Inpatient/ED Visit) (Signed)
   02/25/2024  Name: Terry Young MRN: [id number] DOB: 04/16/1971  Today's TOC FU Call Status: Today's TOC FU Call Status:: Unsuccessful Call (1st Attempt) Unsuccessful Call (1st Attempt) Date: 02/25/24  Attempted to reach the patient regarding the most recent Inpatient/ED visit.  Follow Up Plan: Additional outreach attempts will be made to reach the patient to complete the Transitions of Care (Post Inpatient/ED visit) call.   Andrea Dimes RN, BSN [place]  [facility] RN Care Manager [phone]

## 2024-02-26 ENCOUNTER — Telehealth: Payer: Self-pay | Admitting: *Deleted

## 2024-02-26 NOTE — Transitions of Care (Post Inpatient/ED Visit) (Signed)
   02/26/2024  Name: Terry Young MRN: [id number] DOB: [birth year]  Today's TOC FU Call Status: Today's TOC FU Call Status:: Unsuccessful Call (2nd Attempt) Unsuccessful Call (2nd Attempt) Date: 02/26/24  Attempted to reach the patient regarding the most recent Inpatient/ED visit.  Follow Up Plan: Additional outreach attempts will be made to reach the patient to complete the Transitions of Care (Post Inpatient/ED visit) call.   Andrea Dimes RN, BSN [place]  [facility] RN Care Manager [phone]

## 2024-02-27 ENCOUNTER — Ambulatory Visit (HOSPITAL_COMMUNITY): Admitting: Internal Medicine

## 2024-02-27 ENCOUNTER — Telehealth: Payer: Self-pay | Admitting: *Deleted

## 2024-02-27 ENCOUNTER — Other Ambulatory Visit (HOSPITAL_COMMUNITY): Payer: Self-pay

## 2024-02-27 NOTE — Transitions of Care (Post Inpatient/ED Visit) (Signed)
 02/27/2024  Name: Terry Young MRN: [id number] DOB: 11-07-1970  Today's TOC FU Call Status: Today's TOC FU Call Status:: Successful TOC FU Call Completed TOC FU Call Complete Date: 02/27/24  Patient's Name and Date of Birth confirmed. Name, DOB  Transition Care Management Follow-up Telephone Call Date of Discharge: 02/22/24 Discharge Facility: [facility]) Type of Discharge: Inpatient Admission Primary Inpatient Discharge Diagnosis:: Intractable nausea and vomiting How have you been since you were released from the hospital?: Better Any questions or concerns?: No  Items Reviewed: Did you receive and understand the discharge instructions provided?: Yes Medications obtained,verified, and reconciled?: Yes (Medications Reviewed) Any new allergies since your discharge?: No Dietary orders reviewed?: Yes Type of Diet Ordered:: Low sodium,Heart Healthy/Diabetic Do you have support at home?: Yes People in Home [RPT]: sibling(s) Name of Support/Comfort Primary Source: Sister/Sandy  Medications Reviewed Today: Medications Reviewed Today     Reviewed by Lucky Andrea LABOR, RN (Registered Nurse) on 02/27/24 at 1521  Med List Status: <None>   Medication Order Taking? Sig Documenting Provider Last Dose Status Informant  acetaminophen  (TYLENOL ) 325 MG tablet [id number] Yes Take 325 mg by mouth as needed for mild pain (pain score 1-3) or moderate pain (pain score 4-6). [provider]  Active Self, Pharmacy Records  aspirin  81 MG EC tablet [id number] Yes Take 1 tablet (81 mg total) by mouth daily. Patsy Lenis, MD  Active Self, Pharmacy Records  atorvastatin  (LIPITOR) 40 MG tablet [id number] Yes Take 1 tablet (40 mg total) by mouth daily. Bensimhon, Toribio SAUNDERS, MD  Active Self, Pharmacy Records  carvedilol  (COREG ) 12.5 MG tablet [id number] Yes Take 1 tablet (12.5 mg total) by mouth 2 (two) times daily with a meal. Bensimhon, Toribio SAUNDERS, MD  Active Self, Pharmacy Records  cefadroxil  (DURICEF) 500 MG capsule [id number] Yes Take 2 capsules (1,000 mg total) by mouth 2 (two) times daily for 7 days. Laurence Locus, DO  Active   empagliflozin  (JARDIANCE ) 10 MG TABS tablet [id number] Yes Take 1 tablet (10 mg total) by mouth daily before breakfast. Bensimhon, Toribio SAUNDERS, MD  Active Self, Pharmacy Records  furosemide  (LASIX ) 40 MG tablet [id number]  Take 1 tablet (40 mg total) by mouth daily.  Patient not taking: Reported on 02/27/2024   Bensimhon, Toribio SAUNDERS, MD  Active Self, Pharmacy Records  hydrALAZINE  (APRESOLINE ) 25 MG tablet [id number]  Take 1 tablet (25 mg total) by mouth 3 (three) times daily.  Patient not taking: Reported on 02/27/2024   Bensimhon, Toribio SAUNDERS, MD  Active Self, Pharmacy Records  Insulin  Pen Needle 32G X 4 MM MISC [id number]  Use to inject insulin  as directed. Patsy Lenis, MD  Consider Medication Status and Discontinue (Change in therapy) Self, Pharmacy Records  isosorbide  mononitrate (IMDUR ) 60 MG 24 hr tablet [id number] Yes Take 1 tablet (60 mg total) by mouth daily. Bensimhon, Toribio SAUNDERS, MD  Active Self, Pharmacy Records  metFORMIN  (GLUCOPHAGE ) 1000 MG tablet [id number] Yes Take 1 tablet (1,000 mg total) by mouth 2 (two) times daily with a meal. Must have office visit for refills Newlin, Enobong, MD  Active Self, Pharmacy Records  ondansetron  (ZOFRAN ) 4 MG tablet [id number] Yes Take 1 tablet (4 mg total) by mouth every 8 (eight) hours as needed for nausea or vomiting. Theotis Haze ORN, NP  Active Self, Pharmacy Records  pregabalin  (LYRICA ) 75 MG capsule [id number] Yes Take 1 capsule (75 mg total) by mouth 2 (two) times daily. Theotis Haze ORN, NP  Active Self, Pharmacy Records  sacubitril -valsartan  (ENTRESTO ) [phone]  MG [id number]  Take 1 tablet by mouth 2 (two) times daily. Please call  office to schedule an appointment  Patient not taking: Reported on 02/27/2024   Bensimhon, Toribio SAUNDERS, MD  Active Self, Pharmacy Records  Semaglutide , 2 MG/DOSE, 8 MG/3ML SOPN [id number]  Inject 2  mg as directed once a week.  Patient not taking: Reported on 02/27/2024   Newlin, Enobong, MD  Active Self, Pharmacy Records            Home Care and Equipment/Supplies: Were Home Health Services Ordered?: No Any new equipment or medical supplies ordered?: Yes Name of Medical supply agency?: Rolling walker received prior to discharge Were you able to get the equipment/medical supplies?: Yes Do you have any questions related to the use of the equipment/supplies?: No  Functional Questionnaire: Do you need assistance with bathing/showering or dressing?: No Do you need assistance with meal preparation?: Yes (Sister/Sandy is helping prepare meals) Do you need assistance with eating?: No Do you have difficulty maintaining continence: No Do you need assistance with getting out of bed/getting out of a chair/moving?: No Do you have difficulty managing or taking your medications?: No  Follow up appointments reviewed: PCP Follow-up appointment confirmed?: No (Patient prefers to call and schedule) MD Provider Line [phone] Given: No Specialist Hospital Follow-up appointment confirmed?: Yes Date of Specialist follow-up appointment?: 04/03/24 Follow-Up Specialty Provider:: GI Do you need transportation to your follow-up appointment?: No Do you understand care options if your condition(s) worsen?: Yes-patient verbalized understanding  SDOH Interventions Today    Flowsheet Row Most Recent Value  SDOH Interventions   Food Insecurity Interventions Intervention Not Indicated  Housing Interventions Intervention Not Indicated  Transportation Interventions Intervention Not Indicated  Utilities Interventions Intervention Not Indicated    Andrea Dimes RN, BSN [place]  [facility] RN Care Manager [phone]

## 2024-03-03 ENCOUNTER — Telehealth: Payer: Self-pay | Admitting: Family Medicine

## 2024-03-03 NOTE — Telephone Encounter (Signed)
 Copied from CRM #[id number]. Topic: Appointments - Scheduling Inquiry for Clinic >> Feb 29, 2024  4:45 PM Terry Young wrote:  Reason for CRM: Pt said he was discharged from hospital 11/7 and called to schedule a hospital follow up. Dr. Millard next available is 04/2024 and the other providers available appts are also outside of the 14 day window. Pls call pt to schedule. He is requesting a virtual appt.

## 2024-03-03 NOTE — Telephone Encounter (Signed)
 Contacted Patient. LVM to call back.

## 2024-03-04 NOTE — Telephone Encounter (Addendum)
 Patient returned call back. Patient requested to have his hospital f/u appointment done virtually. I informed him that hospital follow-ups cannot be completed virtually. The patient stated that he has previously had a virtual hospital follow-up, and explained that he is currently unable to walk and needs assistance. I did offer him an appointment with Dr. Newlin tomorrow but patient declined.   Cassie, Please advise if this appointment can be done virtually.

## 2024-03-05 ENCOUNTER — Telehealth: Payer: Self-pay

## 2024-03-05 NOTE — Telephone Encounter (Signed)
 error

## 2024-03-06 NOTE — Telephone Encounter (Signed)
 Spoke with patient on 03/05/2024. Patient confirmed that he is unable to leave his apartment due to difficulty navigating the flight of stairs. I informed the patient that I will reach out to community resources to explore possible assistance and will follow up with him. Patient expressed appreciation. Call concluded.

## 2024-03-12 NOTE — Telephone Encounter (Signed)
 Follow-up call to patient VM left

## 2024-03-14 ENCOUNTER — Telehealth: Payer: Self-pay | Admitting: *Deleted

## 2024-03-14 NOTE — Transitions of Care (Post Inpatient/ED Visit) (Signed)
   03/14/2024  Name: Terry Young MRN: [id number] DOB: 1970-05-01  RNCM received VM message from [name] requesting a work note. RNCM explained to [name] that he would need to schedule an appointment with his PCP to request an out of work note. [name] voiced understanding.  Andrea Dimes RN, BSN [place]  [facility] RN Care Manager [phone]

## 2024-03-17 ENCOUNTER — Other Ambulatory Visit: Payer: Self-pay | Admitting: Family Medicine

## 2024-03-17 DIAGNOSIS — G629 Polyneuropathy, unspecified: Secondary | ICD-10-CM

## 2024-03-17 NOTE — Telephone Encounter (Signed)
 Follow-up call to patient . Patient voiced that he is feeling a lot better, however is not able to navigated the stairs. Patient voiced that he had reach out to his case manager for a note stating that he is not able to work and has not been working. Patient voiced that when he finds all all that is need for the letter/ note he would call us  back.

## 2024-03-18 ENCOUNTER — Other Ambulatory Visit: Payer: Self-pay

## 2024-03-18 MED ORDER — PREGABALIN 75 MG PO CAPS
75.0000 mg | ORAL_CAPSULE | Freq: Two times a day (BID) | ORAL | 2 refills | Status: AC
  Filled 2024-03-18: qty 60, 30d supply, fill #0
  Filled 2024-04-23: qty 60, 30d supply, fill #1

## 2024-03-20 ENCOUNTER — Other Ambulatory Visit: Payer: Self-pay

## 2024-03-27 ENCOUNTER — Telehealth (HOSPITAL_COMMUNITY): Payer: Self-pay | Admitting: Internal Medicine

## 2024-03-27 NOTE — Telephone Encounter (Signed)
 Called to confirm/remind patient of their appointment at the [facility] on 03/27/24.   Appointment:   [] Confirmed  [x] Left mess   [] No answer/No voice mail  [] VM Full/unable to leave message  [] Phone not in service  Patient reminded to bring all medications and/or complete list.  Confirmed patient has transportation. Gave directions, instructed to utilize valet parking.

## 2024-03-28 ENCOUNTER — Other Ambulatory Visit: Payer: Self-pay

## 2024-03-28 ENCOUNTER — Ambulatory Visit (HOSPITAL_COMMUNITY)
Admission: RE | Admit: 2024-03-28 | Discharge: 2024-03-28 | Disposition: A | Source: Ambulatory Visit | Attending: Internal Medicine | Admitting: Internal Medicine

## 2024-03-28 VITALS — BP 140/88 | HR 97 | Wt 265.4 lb

## 2024-03-28 DIAGNOSIS — I5020 Unspecified systolic (congestive) heart failure: Secondary | ICD-10-CM | POA: Diagnosis not present

## 2024-03-28 DIAGNOSIS — I10 Essential (primary) hypertension: Secondary | ICD-10-CM

## 2024-03-28 DIAGNOSIS — I5032 Chronic diastolic (congestive) heart failure: Secondary | ICD-10-CM | POA: Diagnosis not present

## 2024-03-28 MED ORDER — FUROSEMIDE 20 MG PO TABS
20.0000 mg | ORAL_TABLET | Freq: Every day | ORAL | 6 refills | Status: AC
  Filled 2024-03-28: qty 30, 30d supply, fill #0
  Filled 2024-05-15: qty 30, 30d supply, fill #1

## 2024-03-28 MED ORDER — LOSARTAN POTASSIUM 50 MG PO TABS
50.0000 mg | ORAL_TABLET | Freq: Every day | ORAL | 6 refills | Status: AC
  Filled 2024-03-28: qty 30, 30d supply, fill #0
  Filled 2024-05-15: qty 30, 30d supply, fill #1

## 2024-03-28 NOTE — Addendum Note (Signed)
 Encounter addended by: Buell Powell HERO, RN on: 03/28/2024 3:58 PM  Actions taken: Medication long-term status modified, Visit diagnoses modified, Order list changed, Diagnosis association updated, Clinical Note Signed

## 2024-03-28 NOTE — Patient Instructions (Signed)
 Medication Changes:  START Furosemide  20 mg Daily  START Losartan 50 mg Daily  Special Instructions // Education:  Do the following things EVERYDAY: Weigh yourself in the morning before breakfast. Write it down and keep it in a log. Take your medicines as prescribed Eat low salt foods--Limit salt (sodium) to 2000 mg per day.  Stay as active as you can everyday Limit all fluids for the day to less than 2 liters   Follow-Up in: CONGRATULATIONS!!! You have graduated the HF Clinic, you have been referred to general cardiology they will call you to schedule an appointment    At the [facility], you and your health needs are our priority. We have a designated team specialized in the treatment of Heart Failure. This Care Team includes your primary Heart Failure Specialized Cardiologist (physician), Advanced Practice Providers (APPs- Physician Assistants and Nurse Practitioners), and Pharmacist who all work together to provide you with the care you need, when you need it.   You may see any of the following providers on your designated Care Team at your next follow up:  Dr. Toribio Fuel Dr. Ezra Shuck Dr. Odis Brownie Greig Mosses, NP Caffie Shed, [place] [facility] [place], [place] Beckey Coe, NP Jordan Lee, NP Tinnie Redman, PharmD   Please be sure to bring in all your medications bottles to every appointment.   Need to Contact Us :  If you have any questions or concerns before your next appointment please send us  a message through [place] or call our office at [phone].    TO LEAVE A MESSAGE FOR THE NURSE SELECT OPTION 2, PLEASE LEAVE A MESSAGE INCLUDING: YOUR NAME DATE OF BIRTH CALL BACK NUMBER REASON FOR CALL**this is important as we prioritize the call backs  YOU WILL RECEIVE A CALL BACK THE SAME DAY AS LONG AS YOU CALL BEFORE 4:00 PM

## 2024-03-28 NOTE — Progress Notes (Signed)
 [facility] PROGRESS NOTE  Referring Physician: Greig Mosses, NP Primary Care: [name], MD HF Cardiologist: Dr. Cherrie  Reason for Visit. F/u for HFimpEF   HPI:  Terry Young is a 53 y.o. male w/ chronic biventricular systolic heart failure, HTN, poorly controlled DM2, HL and obesity.   Previously followed at [facility] Diagnosed w/ CHF ~2015 though no records are available during that time. He recalls being diagnosed w/ CHF shortly after a bout w/ a viral illness, which he believes was flu. Echo 2018 w/ EF 40-45%. NST showed no ischemia. CM felt likely NICM from uncontrolled HTN, per notes from [facility]. He has never had a LHC. Uncertain if he has had a cMRI.    Admitted 10/22 a/c CHF. Echo w/ severe biventricular dysfunction. LVEF down to <20%, RV severely reduced w/ global HK, GIIDD. He was diuresed w/ IV Lasix  and placed on GDMT. On Jardiance , Entresto , Spiro and Coreg . Discharge wt 297 lb.  Hgb A1c 14.    He was seen in the HF TOC in October 2022. Entresto  was increased 97-103 mg bid and lasix  was cut back to 40 mg daily. Follow up in [facility] 1/23 and out of all meds x 2 weeks except Entresto . Meds restarted and referred to paramedicine and to the [facility].   Echo (12/14/21):  EF 55-60% G1DD  Echo 11/25 EF 60-65% G1DD RV ok Personally reviewed  Admitted in 11/25 with N/V and low BP. HF meds held   He returns today for routine f/u. Remains weak . Working with rehab to get around. Nausea resolved. No further low BPs. No edema      Cardiac Testing   - Care Everywhere ([facility])--> Echo (2018): EF 40-45% - Echo (01/27/21): EF < 20% Grade II DD RV severely reduced - Echo (12/14/21):  EF 55-60% G1DD  - Care Everywhere [facility]) Myoview (2018)>> No ischemia      Past Medical History:  Diagnosis Date   CHF (congestive heart failure) (HCC)    CKD stage 3a, GFR 45-59 ml/min (HCC) 02/17/2024   Diabetes mellitus without complication (HCC)    Hypertension    Myocardial injury 12/30/2023    Current Outpatient Medications  Medication Sig Dispense Refill   acetaminophen  (TYLENOL ) 325 MG tablet Take 325 mg by mouth as needed for mild pain (pain score 1-3) or moderate pain (pain score 4-6).     aspirin  81 MG EC tablet Take 1 tablet (81 mg total) by mouth daily. 90 tablet 0   atorvastatin  (LIPITOR) 40 MG tablet Take 1 tablet (40 mg total) by mouth daily. 90 tablet 3   carvedilol  (COREG ) 12.5 MG tablet Take 1 tablet (12.5 mg total) by mouth 2 (two) times daily with a meal. 180 tablet 3   empagliflozin  (JARDIANCE ) 10 MG TABS tablet Take 1 tablet (10 mg total) by mouth daily before breakfast. 90 tablet 3   Insulin  Pen Needle 32G X 4 MM MISC Use to inject insulin  as directed. 100 each 0   isosorbide  mononitrate (IMDUR ) 60 MG 24 hr tablet Take 1 tablet (60 mg total) by mouth daily. 90 tablet 3   metFORMIN  (GLUCOPHAGE ) 1000 MG tablet Take 1 tablet (1,000 mg total) by mouth 2 (two) times daily with a meal. Must have office visit for refills 180 tablet 1   ondansetron  (ZOFRAN ) 4 MG tablet Take 1 tablet (4 mg total) by mouth every 8 (eight) hours as needed for nausea or vomiting. 30 tablet 1   pregabalin  (LYRICA ) 75 MG capsule Take  1 capsule (75 mg total) by mouth 2 (two) times daily. 60 capsule 2   [Paused] furosemide  (LASIX ) 40 MG tablet Take 1 tablet (40 mg total) by mouth daily. (Patient not taking: Reported on 03/28/2024) 90 tablet 3   [Paused] hydrALAZINE  (APRESOLINE ) 25 MG tablet Take 1 tablet (25 mg total) by mouth 3 (three) times daily. (Patient not taking: Reported on 03/28/2024) 90 tablet 3   [Paused] sacubitril -valsartan  (ENTRESTO ) 97-103 MG Take 1 tablet by mouth 2 (two) times daily. Please call  office to schedule an appointment (Patient not taking: Reported on 03/28/2024) 60 tablet 3   [Paused] Semaglutide , 2 MG/DOSE, 8 MG/3ML SOPN Inject 2 mg as directed once a week. (Patient not taking: Reported on 03/28/2024) 9 mL 1   No current facility-administered medications for this  encounter.   Allergies  Allergen Reactions   Lisinopril Cough   Magnesium  Nausea And Vomiting   Social History   Socioeconomic History   Marital status: Single    Spouse name: Not on file   Number of children: Not on file   Years of education: Not on file   Highest education level: Not on file  Occupational History   Not on file  Tobacco Use   Smoking status: Never   Smokeless tobacco: Never  Substance and Sexual Activity   Alcohol use: Never   Drug use: Never   Sexual activity: Not on file  Other Topics Concern   Not on file  Social History Narrative   Not on file   Social Drivers of Health   Tobacco Use: Low Risk (02/17/2024)   Patient History    Smoking Tobacco Use: Never    Smokeless Tobacco Use: Never    Passive Exposure: Not on file  Financial Resource Strain: Not on file  Food Insecurity: No Food Insecurity (02/27/2024)   Epic    Worried About Programme Researcher, Broadcasting/film/video in the Last Year: Never true    Ran Out of Food in the Last Year: Never true  Transportation Needs: No Transportation Needs (02/27/2024)   Epic    Lack of Transportation (Medical): No    Lack of Transportation (Non-Medical): No  Physical Activity: Not on file  Stress: Not on file  Social Connections: Not on file  Intimate Partner Violence: Not At Risk (02/27/2024)   Epic    Fear of Current or Ex-Partner: No    Emotionally Abused: No    Physically Abused: No    Sexually Abused: No  Depression (PHQ2-9): Low Risk (02/27/2024)   Depression (PHQ2-9)    PHQ-2 Score: 1  Alcohol Screen: Not on file  Housing: Unknown (02/27/2024)   Epic    Unable to Pay for Housing in the Last Year: No    Number of Times Moved in the Last Year: Not on file    Homeless in the Last Year: No  Utilities: Not At Risk (02/27/2024)   Epic    Threatened with loss of utilities: No  Health Literacy: Not on file   Family History  Problem Relation Age of Onset   Atrial fibrillation Neg Hx    Family h/o is notable for  CHF, CAD and Afib (father).   BP (!) 140/88   Pulse 97   Wt 120.4 kg (265 lb 6 oz)   SpO2 96%   BMI 38.08 kg/m   Wt Readings from Last 3 Encounters:  03/28/24 120.4 kg (265 lb 6 oz)  02/22/24 114.8 kg (253 lb 1.4 oz)  01/01/24 116 kg (255  lb 11.7 oz)   PHYSICAL EXAM: General:  Sitting in WC. No resp difficulty HEENT: normal Neck: supple. no JVD.  Cor: Regular rate & rhythm. No rubs, gallops or murmurs. Lungs: clear Abdomen: soft, nontender, nondistended.Good bowel sounds. Extremities: no cyanosis, clubbing, rash, 1+edema Neuro: alert & orientedx3, cranial nerves grossly intact. moves all 4 extremities w/o difficulty. Affect pleasant   ASSESSMENT & PLAN:  1. Chronic Biventricular Systolic Heart Failure with improved EF - presumed NICM, likely hypertensive CM but cannot r/o viral CM  - Echo 2018 ([facility]) EF 40-45%, NST low risk. Has never had LHC nor cMRI  - Echo 10/22 EF <20%, RV severely reduced. - Echo 12/14/21 EF 55-60% G1DD. - Echo 11/25 EF 60-65% G1DD RV ok Personally reviewed - NYHA I-II Volume mildly elevated - Restart lasix  20 daily - Continue to hold Entrresto - Start losartan 50 daily - Continue Jardiance  10 mg daily  - Off Spironolactone  due to hyperkalemia - Continue Coreg  12.5 mg bid  - Off hydral, imdur  and Entresto  due to low BP    2. Hypertension  - moderately elevated - with recent low BP with be gentle reintroduncing meds. Start losartan 50    3. DM2 - Much improved control, A1c 14. 4 (10/22) -> 7.6 (4/25)  - Continue Jardiance   - on statin, Atorva 40 mg daily  - Per PCP  4. Obesity  - Body mass index is 38.08 kg/m.  5. CKD 3a - baseline Scr ~1.5  - Last SCr 1.0 on 02/21/24  - continue Jardiance   EF recovered can graduate HF Clinic. F/u [facility]   Toribio Fuel, MD  3:44 PM

## 2024-04-02 NOTE — Progress Notes (Deleted)
 Chief Complaint: Nausea and vomiting  HPI:    Mr. Terry Young is a 53 year old male with a past medical history as listed below including CHF, CKD stage III and diabetes, who was referred to me by [name], MD for a complaint of nausea and vomiting.      02/16/2024 CTAP with contrast with hepatic steatosis and otherwise no acute process.    02/19/24 echo done for unspecified combined systolic congestive heart failure.  Noted to be a poor study.  LVEF 60-65%.  Grade 1 diastolic dysfunction.    02/21/24 CBC with a hemoglobin of 11 (14.1 on 02/17/2024), MCV normal, platelets low at 125.  CMP with a glucose of 145, ALT 45, albumin  low at 3.2 and otherwise normal.    02/16/2024-02/22/2024 patient admitted to the hospital after presenting with abdominal pain, nausea and vomiting.  Apparently admitted with similar symptoms in September suspected secondary to Ozempic .  Patient became hypotensive o also diagnosed with Klebsiella pneumonia.  Started on IV Rocephin .  n 11/2.  BP improved with IV fluids, nausea improved on 11/3.    03/28/2024 office visit with Dr. Odis Naegeli with cardiology.  At that point restarted on Lasix  20 mg daily and Losartan  50 mg daily.  Continued on Jardiance  10 mg daily.  Off spironolactone  due to hyperkalemia.  Continue on Coreg .  Past Medical History:  Diagnosis Date   CHF (congestive heart failure) (HCC)    CKD stage 3a, GFR 45-59 ml/min (HCC) 02/17/2024   Diabetes mellitus without complication (HCC)    Hypertension    Myocardial injury 12/30/2023    No past surgical history on file.  Current Outpatient Medications  Medication Sig Dispense Refill   acetaminophen  (TYLENOL ) 325 MG tablet Take 325 mg by mouth as needed for mild pain (pain score 1-3) or moderate pain (pain score 4-6).     aspirin  81 MG EC tablet Take 1 tablet (81 mg total) by mouth daily. 90 tablet 0   atorvastatin  (LIPITOR) 40 MG tablet Take 1 tablet (40 mg total) by mouth daily. 90 tablet 3   carvedilol   (COREG ) 12.5 MG tablet Take 1 tablet (12.5 mg total) by mouth 2 (two) times daily with a meal. 180 tablet 3   empagliflozin  (JARDIANCE ) 10 MG TABS tablet Take 1 tablet (10 mg total) by mouth daily before breakfast. 90 tablet 3   furosemide  (LASIX ) 20 MG tablet Take 1 tablet (20 mg total) by mouth daily. 30 tablet 6   Insulin  Pen Needle 32G X 4 MM MISC Use to inject insulin  as directed. 100 each 0   isosorbide  mononitrate (IMDUR ) 60 MG 24 hr tablet Take 1 tablet (60 mg total) by mouth daily. 90 tablet 3   losartan  (COZAAR ) 50 MG tablet Take 1 tablet (50 mg total) by mouth daily. 30 tablet 6   metFORMIN  (GLUCOPHAGE ) 1000 MG tablet Take 1 tablet (1,000 mg total) by mouth 2 (two) times daily with a meal. Must have office visit for refills 180 tablet 1   ondansetron  (ZOFRAN ) 4 MG tablet Take 1 tablet (4 mg total) by mouth every 8 (eight) hours as needed for nausea or vomiting. 30 tablet 1   pregabalin  (LYRICA ) 75 MG capsule Take 1 capsule (75 mg total) by mouth 2 (two) times daily. 60 capsule 2   [Paused] Semaglutide , 2 MG/DOSE, 8 MG/3ML SOPN Inject 2 mg as directed once a week. (Patient not taking: Reported on 03/28/2024) 9 mL 1   No current facility-administered medications for this visit.  Allergies as of 04/03/2024 - Review Complete 03/28/2024  Allergen Reaction Noted   Lisinopril Cough 06/04/2020   Magnesium  Nausea And Vomiting 02/17/2024    Family History  Problem Relation Age of Onset   Atrial fibrillation Neg Hx     Social History   Socioeconomic History   Marital status: Single    Spouse name: Not on file   Number of children: Not on file   Years of education: Not on file   Highest education level: Not on file  Occupational History   Not on file  Tobacco Use   Smoking status: Never   Smokeless tobacco: Never  Substance and Sexual Activity   Alcohol use: Never   Drug use: Never   Sexual activity: Not on file  Other Topics Concern   Not on file  Social History  Narrative   Not on file   Social Drivers of Health   Tobacco Use: Low Risk (02/17/2024)   Patient History    Smoking Tobacco Use: Never    Smokeless Tobacco Use: Never    Passive Exposure: Not on file  Financial Resource Strain: Not on file  Food Insecurity: No Food Insecurity (02/27/2024)   Epic    Worried About Programme Researcher, Broadcasting/film/video in the Last Year: Never true    Ran Out of Food in the Last Year: Never true  Transportation Needs: No Transportation Needs (02/27/2024)   Epic    Lack of Transportation (Medical): No    Lack of Transportation (Non-Medical): No  Physical Activity: Not on file  Stress: Not on file  Social Connections: Not on file  Intimate Partner Violence: Not At Risk (02/27/2024)   Epic    Fear of Current or Ex-Partner: No    Emotionally Abused: No    Physically Abused: No    Sexually Abused: No  Depression (PHQ2-9): Low Risk (02/27/2024)   Depression (PHQ2-9)    PHQ-2 Score: 1  Alcohol Screen: Not on file  Housing: Unknown (02/27/2024)   Epic    Unable to Pay for Housing in the Last Year: No    Number of Times Moved in the Last Year: Not on file    Homeless in the Last Year: No  Utilities: Not At Risk (02/27/2024)   Epic    Threatened with loss of utilities: No  Health Literacy: Not on file    Review of Systems:    Constitutional: No weight loss, fever, chills, weakness or fatigue HEENT: Eyes: No change in vision               Ears, Nose, Throat:  No change in hearing or congestion Skin: No rash or itching Cardiovascular: No chest pain, chest pressure or palpitations   Respiratory: No SOB or cough Gastrointestinal: See HPI and otherwise negative Genitourinary: No dysuria or change in urinary frequency Neurological: No headache, dizziness or syncope Musculoskeletal: No new muscle or joint pain Hematologic: No bleeding or bruising Psychiatric: No history of depression or anxiety    Physical Exam:  Vital signs: There were no vitals taken for this  visit.  Constitutional:   Pleasant Caucasian male appears to be in NAD, Well developed, Well nourished, alert and cooperative Head:  Normocephalic and atraumatic. Eyes:   PEERL, EOMI. No icterus. Conjunctiva pink. Ears:  Normal auditory acuity. Neck:  Supple Throat: Oral cavity and pharynx without inflammation, swelling or lesion.  Respiratory: Respirations even and unlabored. Lungs clear to auscultation bilaterally.   No wheezes, crackles, or rhonchi.  Cardiovascular: Normal  S1, S2. No MRG. Regular rate and rhythm. No peripheral edema, cyanosis or pallor.  Gastrointestinal:  Soft, nondistended, nontender. No rebound or guarding. Normal bowel sounds. No appreciable masses or hepatomegaly. Rectal:  Not performed.  Msk:  Symmetrical without gross deformities. Without edema, no deformity or joint abnormality.  Neurologic:  Alert and  oriented x4;  grossly normal neurologically.  Skin:   Dry and intact without significant lesions or rashes. Psychiatric: Oriented to person, place and time. Demonstrates good judgement and reason without abnormal affect or behaviors.  RELEVANT LABS AND IMAGING: CBC    Component Value Date/Time   WBC 6.6 02/21/2024 0438   RBC 3.84 (L) 02/21/2024 0438   HGB 11.0 (L) 02/21/2024 0438   HCT 34.3 (L) 02/21/2024 0438   PLT 125 (L) 02/21/2024 0438   MCV 89.3 02/21/2024 0438   MCH 28.6 02/21/2024 0438   MCHC 32.1 02/21/2024 0438   RDW 14.6 02/21/2024 0438   LYMPHSABS 2.2 02/21/2024 0438   MONOABS 0.8 02/21/2024 0438   EOSABS 0.3 02/21/2024 0438   BASOSABS 0.0 02/21/2024 0438    CMP     Component Value Date/Time   NA 138 02/21/2024 0438   NA 136 08/16/2023 1046   K 3.6 02/21/2024 0438   CL 107 02/21/2024 0438   CO2 21 (L) 02/21/2024 0438   GLUCOSE 145 (H) 02/21/2024 0438   BUN 12 02/21/2024 0438   BUN 18 08/16/2023 1046   CREATININE 1.08 02/21/2024 0438   CALCIUM  8.2 (L) 02/21/2024 0438   PROT 5.7 (L) 02/21/2024 0438   PROT 7.5 08/16/2023 1046    ALBUMIN  3.2 (L) 02/21/2024 0438   ALBUMIN  4.3 08/16/2023 1046   AST 32 02/21/2024 0438   ALT 45 (H) 02/21/2024 0438   ALKPHOS 52 02/21/2024 0438   BILITOT 0.5 02/21/2024 0438   BILITOT 0.7 08/16/2023 1046   GFRNONAA >60 02/21/2024 0438    Assessment: 1. ***  Plan: 1. ***     Delon Failing, PA-C [facility] 04/02/2024, 9:56 AM  Cc: [name], MD

## 2024-04-03 ENCOUNTER — Ambulatory Visit: Admitting: Physician Assistant

## 2024-04-21 ENCOUNTER — Telehealth: Payer: Self-pay | Admitting: Physician Assistant

## 2024-04-21 NOTE — Telephone Encounter (Signed)
 Patient returned phone call for tomorrow. Confirmed 10 am appointment time. Please advise, thank you

## 2024-04-22 ENCOUNTER — Ambulatory Visit (INDEPENDENT_AMBULATORY_CARE_PROVIDER_SITE_OTHER): Admitting: Physician Assistant

## 2024-04-22 ENCOUNTER — Encounter: Payer: Self-pay | Admitting: Physician Assistant

## 2024-04-22 VITALS — BP 190/100 | HR 87 | Ht 70.0 in | Wt 270.4 lb

## 2024-04-22 DIAGNOSIS — Z1212 Encounter for screening for malignant neoplasm of rectum: Secondary | ICD-10-CM

## 2024-04-22 DIAGNOSIS — R112 Nausea with vomiting, unspecified: Secondary | ICD-10-CM

## 2024-04-22 NOTE — Progress Notes (Signed)
 "  Chief Complaint: Nausea and vomiting  HPI:    Terry Young is a 54 year old male with a past medical history as listed below including CHF, CKD stage III and diabetes, who was referred to me by [name], MD for a complaint of nausea and vomiting.      12/30/2023-01/02/2024 hospitalization for nausea and vomiting.  Encouraged to hold Ozempic  at that time.    02/16/2024 CTAP with contrast with hepatic steatosis and otherwise no acute process.    02/19/24 echo done for unspecified combined systolic congestive heart failure.  Noted to be a poor study.  LVEF 60-65%.  Grade 1 diastolic dysfunction.    02/21/24 CBC with a hemoglobin of 11 (14.1 on 02/17/2024), MCV normal, platelets low at 125.  CMP with a glucose of 145, ALT 45, albumin  low at 3.2 and otherwise normal.    02/16/2024-02/22/2024 patient admitted to the hospital after presenting with abdominal pain, nausea and vomiting.  Apparently admitted with similar symptoms in September suspected secondary to Ozempic .  Patient became hypotensive and also diagnosed with Klebsiella pneumonia.  Started on IV Rocephin .  n 11/2.  BP improved with IV fluids, nausea improved on 11/3.    03/28/2024 office visit with Dr. Chet with cardiology.  At that point restarted on Lasix  20 mg daily and Losartan  50 mg daily.  Continued on Jardiance  10 mg daily.  Off spironolactone  due to hyperkalemia.  Continue on Coreg .    Today, patient presents to clinic and tells me that he is almost to 100% better after his recent hospitalization x 2 for nausea and vomiting.  Reminds me that they thought this was all related to the Ozempic  which they stopped after his first hospitalization in September.  Apparently still had issues though and was hospitalized again in November, at that time extensive workup and still thought secondary to Ozempic .  He has since completely discontinued this and tells me he only gets nauseous very occasionally if he eats the wrong thing.  He is feeling good.   He does not want any further workup.  Describes he is having good bowel movements.  No abdominal pain.    Does describe that he grew very weak during his last hospitalization as he could not take his meds and could not get out of the bed.  Apparently they sent him home without having him be able to walk up and down the stairs in his apartment is up on the 3rd or 4th floor.  He had to have an ambulance to take him home and pull them up the stairs and was stuck in his house for a long time as no one came for home OT or PT.  Now doing better.    Denies fever, chills, weight loss, nausea, vomiting, abdominal pain or change in bowel habits.  Past Medical History:  Diagnosis Date   CHF (congestive heart failure) (HCC)    CKD stage 3a, GFR 45-59 ml/min (HCC) 02/17/2024   Diabetes mellitus without complication (HCC)    Hypertension    Myocardial injury 12/30/2023    No past surgical history on file.  Current Outpatient Medications  Medication Sig Dispense Refill   acetaminophen  (TYLENOL ) 325 MG tablet Take 325 mg by mouth as needed for mild pain (pain score 1-3) or moderate pain (pain score 4-6).     aspirin  81 MG EC tablet Take 1 tablet (81 mg total) by mouth daily. 90 tablet 0   atorvastatin  (LIPITOR) 40 MG tablet Take 1 tablet (40  mg total) by mouth daily. 90 tablet 3   carvedilol  (COREG ) 12.5 MG tablet Take 1 tablet (12.5 mg total) by mouth 2 (two) times daily with a meal. 180 tablet 3   empagliflozin  (JARDIANCE ) 10 MG TABS tablet Take 1 tablet (10 mg total) by mouth daily before breakfast. 90 tablet 3   furosemide  (LASIX ) 20 MG tablet Take 1 tablet (20 mg total) by mouth daily. 30 tablet 6   Insulin  Pen Needle 32G X 4 MM MISC Use to inject insulin  as directed. 100 each 0   isosorbide  mononitrate (IMDUR ) 60 MG 24 hr tablet Take 1 tablet (60 mg total) by mouth daily. 90 tablet 3   losartan  (COZAAR ) 50 MG tablet Take 1 tablet (50 mg total) by mouth daily. 30 tablet 6   metFORMIN  (GLUCOPHAGE ) 1000  MG tablet Take 1 tablet (1,000 mg total) by mouth 2 (two) times daily with a meal. Must have office visit for refills 180 tablet 1   ondansetron  (ZOFRAN ) 4 MG tablet Take 1 tablet (4 mg total) by mouth every 8 (eight) hours as needed for nausea or vomiting. 30 tablet 1   pregabalin  (LYRICA ) 75 MG capsule Take 1 capsule (75 mg total) by mouth 2 (two) times daily. 60 capsule 2   [Paused] Semaglutide , 2 MG/DOSE, 8 MG/3ML SOPN Inject 2 mg as directed once a week. (Patient not taking: Reported on 03/28/2024) 9 mL 1   No current facility-administered medications for this visit.    Allergies as of 04/22/2024 - Review Complete 03/28/2024  Allergen Reaction Noted   Lisinopril Cough 06/04/2020   Magnesium  Nausea And Vomiting 02/17/2024    Family History  Problem Relation Age of Onset   Atrial fibrillation Neg Hx     Social History   Socioeconomic History   Marital status: Single    Spouse name: Not on file   Number of children: Not on file   Years of education: Not on file   Highest education level: Not on file  Occupational History   Not on file  Tobacco Use   Smoking status: Never   Smokeless tobacco: Never  Substance and Sexual Activity   Alcohol use: Never   Drug use: Never   Sexual activity: Not on file  Other Topics Concern   Not on file  Social History Narrative   Not on file   Social Drivers of Health   Tobacco Use: Low Risk (02/17/2024)   Patient History    Smoking Tobacco Use: Never    Smokeless Tobacco Use: Never    Passive Exposure: Not on file  Financial Resource Strain: Not on file  Food Insecurity: No Food Insecurity (02/27/2024)   Epic    Worried About Programme Researcher, Broadcasting/film/video in the Last Year: Never true    Ran Out of Food in the Last Year: Never true  Transportation Needs: No Transportation Needs (02/27/2024)   Epic    Lack of Transportation (Medical): No    Lack of Transportation (Non-Medical): No  Physical Activity: Not on file  Stress: Not on file   Social Connections: Not on file  Intimate Partner Violence: Not At Risk (02/27/2024)   Epic    Fear of Current or Ex-Partner: No    Emotionally Abused: No    Physically Abused: No    Sexually Abused: No  Depression (PHQ2-9): Low Risk (02/27/2024)   Depression (PHQ2-9)    PHQ-2 Score: 1  Alcohol Screen: Not on file  Housing: Unknown (02/27/2024)   Epic  Unable to Pay for Housing in the Last Year: No    Number of Times Moved in the Last Year: Not on file    Homeless in the Last Year: No  Utilities: Not At Risk (02/27/2024)   Epic    Threatened with loss of utilities: No  Health Literacy: Not on file    Review of Systems:    Constitutional: No weight loss, fever or chills Skin: No rash Cardiovascular: No chest pain Respiratory: No SOB  Gastrointestinal: See HPI and otherwise negative Genitourinary: No dysuria  Neurological: No headache, dizziness or syncope Musculoskeletal: No new muscle or joint pain Hematologic: No bleeding  Psychiatric: No history of depression or anxiety   Physical Exam:  Vital signs: BP (!) 190/100   Pulse 87   Ht 5' 10 (1.778 m)   Wt 270 lb 6 oz (122.6 kg)   BMI 38.79 kg/m    Constitutional:   Pleasant obese AA male appears to be in NAD, Well developed, Well nourished, alert and cooperative Head:  Normocephalic and atraumatic. Eyes:   PEERL, EOMI. No icterus. Conjunctiva pink. Ears:  Normal auditory acuity. Neck:  Supple Throat: Oral cavity and pharynx without inflammation, swelling or lesion.  Respiratory: Respirations even and unlabored. Lungs clear to auscultation bilaterally.   No wheezes, crackles, or rhonchi.  Cardiovascular: Normal S1, S2. No MRG. Regular rate and rhythm. No peripheral edema, cyanosis or pallor.  Gastrointestinal:  Soft, nondistended, nontender. No rebound or guarding. Normal bowel sounds. No appreciable masses or hepatomegaly. Rectal:  Not performed.  Msk:  Symmetrical without gross deformities. Without edema, no  deformity or joint abnormality. +in a wheelchair Neurologic:  Alert and  oriented x4;  grossly normal neurologically.  Skin:   Dry and intact without significant lesions or rashes. Psychiatric: Oriented to person, place and time. Demonstrates good judgement and reason without abnormal affect or behaviors.  RELEVANT LABS AND IMAGING: CBC    Component Value Date/Time   WBC 6.6 02/21/2024 0438   RBC 3.84 (L) 02/21/2024 0438   HGB 11.0 (L) 02/21/2024 0438   HCT 34.3 (L) 02/21/2024 0438   PLT 125 (L) 02/21/2024 0438   MCV 89.3 02/21/2024 0438   MCH 28.6 02/21/2024 0438   MCHC 32.1 02/21/2024 0438   RDW 14.6 02/21/2024 0438   LYMPHSABS 2.2 02/21/2024 0438   MONOABS 0.8 02/21/2024 0438   EOSABS 0.3 02/21/2024 0438   BASOSABS 0.0 02/21/2024 0438    CMP     Component Value Date/Time   NA 138 02/21/2024 0438   NA 136 08/16/2023 1046   K 3.6 02/21/2024 0438   CL 107 02/21/2024 0438   CO2 21 (L) 02/21/2024 0438   GLUCOSE 145 (H) 02/21/2024 0438   BUN 12 02/21/2024 0438   BUN 18 08/16/2023 1046   CREATININE 1.08 02/21/2024 0438   CALCIUM  8.2 (L) 02/21/2024 0438   PROT 5.7 (L) 02/21/2024 0438   PROT 7.5 08/16/2023 1046   ALBUMIN  3.2 (L) 02/21/2024 0438   ALBUMIN  4.3 08/16/2023 1046   AST 32 02/21/2024 0438   ALT 45 (H) 02/21/2024 0438   ALKPHOS 52 02/21/2024 0438   BILITOT 0.5 02/21/2024 0438   BILITOT 0.7 08/16/2023 1046   GFRNONAA >60 02/21/2024 0438    Assessment: 1.  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  Screening for colorectal  cancer: Patient describes a colonoscopy in [place] in 2021, cannot see the report in the computer, will request we can get him an appropriate recall  Plan: 1.  At this point patient will follow-up with us  as needed.  He is feeling well now.  Most likely his symptoms were related to Ozempic  which he has since stopped.  Would recommend against any GLP-1 medications in the future due to this reaction. 2.  Will request patient's last colonoscopy from [facility], he reports in 2021 with no polyps, hypothetically would be due in 2031 for repeat.  Will adjust recall appropriately when we get records. 3.  Patient assigned to Dr. Nandigam today.  He will follow in clinic with us  as needed or for his next colonoscopy.  Delon Failing, PA-C [facility] 04/22/2024, 9:15 AM  Cc: Newlin, Enobong, MD  "

## 2024-04-22 NOTE — Patient Instructions (Signed)
 Follow up as needed.   _______________________________________________________  If your blood pressure at your visit was 140/90 or greater, please contact your primary care physician to follow up on this.  _______________________________________________________  If you are age 53 or older, your body mass index should be between 23-30. Your Body mass index is 38.79 kg/m. If this is out of the aforementioned range listed, please consider follow up with your Primary Care Provider.  If you are age 2 or younger, your body mass index should be between 19-25. Your Body mass index is 38.79 kg/m. If this is out of the aformentioned range listed, please consider follow up with your Primary Care Provider.   ________________________________________________________  The  GI providers would like to encourage you to use MYCHART to communicate with providers for non-urgent requests or questions.  Due to long hold times on the telephone, sending your provider a message by [facility] may be a faster and more efficient way to get a response.  Please allow 48 business hours for a response.  Please remember that this is for non-urgent requests.  _______________________________________________________  [facility] is using a team-based approach to care.  Your team is made up of your doctor and two to three APPS. Our APPS (Nurse Practitioners and Physician Assistants) work with your physician to ensure care continuity for you. They are fully qualified to address your health concerns and develop a treatment plan. They communicate directly with your gastroenterologist to care for you. Seeing the Advanced Practice Practitioners on your physician's team can help you by facilitating care more promptly, often allowing for earlier appointments, access to diagnostic testing, procedures, and other specialty referrals.

## 2024-05-16 ENCOUNTER — Other Ambulatory Visit: Payer: Self-pay

## 2024-05-21 ENCOUNTER — Ambulatory Visit: Admitting: Family Medicine

## 2024-07-08 ENCOUNTER — Ambulatory Visit: Admitting: Family Medicine
